# Patient Record
Sex: Male | Born: 1955 | Race: White | Hispanic: No | Marital: Married | Smoking: Former smoker
Health system: Southern US, Community
[De-identification: ages and names within clinical notes are randomized; demographics above are authoritative.]

## PROBLEM LIST (undated history)

## (undated) DIAGNOSIS — T8859XA Other complications of anesthesia, initial encounter: Secondary | ICD-10-CM

## (undated) DIAGNOSIS — Z87442 Personal history of urinary calculi: Secondary | ICD-10-CM

## (undated) DIAGNOSIS — T4145XA Adverse effect of unspecified anesthetic, initial encounter: Secondary | ICD-10-CM

## (undated) DIAGNOSIS — N189 Chronic kidney disease, unspecified: Secondary | ICD-10-CM

## (undated) DIAGNOSIS — E7800 Pure hypercholesterolemia, unspecified: Secondary | ICD-10-CM

## (undated) DIAGNOSIS — M549 Dorsalgia, unspecified: Secondary | ICD-10-CM

## (undated) DIAGNOSIS — F1010 Alcohol abuse, uncomplicated: Secondary | ICD-10-CM

## (undated) DIAGNOSIS — E119 Type 2 diabetes mellitus without complications: Secondary | ICD-10-CM

## (undated) DIAGNOSIS — R51 Headache: Secondary | ICD-10-CM

## (undated) DIAGNOSIS — A549 Gonococcal infection, unspecified: Secondary | ICD-10-CM

## (undated) DIAGNOSIS — I2510 Atherosclerotic heart disease of native coronary artery without angina pectoris: Secondary | ICD-10-CM

## (undated) DIAGNOSIS — I10 Essential (primary) hypertension: Secondary | ICD-10-CM

## (undated) DIAGNOSIS — M1990 Unspecified osteoarthritis, unspecified site: Secondary | ICD-10-CM

## (undated) DIAGNOSIS — G8929 Other chronic pain: Secondary | ICD-10-CM

## (undated) DIAGNOSIS — K219 Gastro-esophageal reflux disease without esophagitis: Secondary | ICD-10-CM

## (undated) DIAGNOSIS — M6530 Trigger finger, unspecified finger: Secondary | ICD-10-CM

## (undated) DIAGNOSIS — IMO0002 Reserved for concepts with insufficient information to code with codable children: Secondary | ICD-10-CM

## (undated) HISTORY — PX: CERVICAL DISC SURGERY: SHX588

## (undated) HISTORY — PX: LUMBAR FUSION: SHX111

## (undated) HISTORY — PX: KNEE ARTHROSCOPY: SUR90

## (undated) HISTORY — PX: KNEE SURGERY: SHX244

---

## 1981-09-15 HISTORY — PX: VASECTOMY: SHX75

## 2006-09-15 HISTORY — PX: LUMBAR FUSION: SHX111

## 2006-12-24 ENCOUNTER — Inpatient Hospital Stay (HOSPITAL_COMMUNITY): Admission: RE | Admit: 2006-12-24 | Discharge: 2006-12-26 | Payer: Self-pay | Admitting: Neurosurgery

## 2007-03-05 ENCOUNTER — Ambulatory Visit: Payer: Self-pay | Admitting: Gastroenterology

## 2008-10-17 ENCOUNTER — Ambulatory Visit: Payer: Self-pay | Admitting: Gastroenterology

## 2009-09-19 ENCOUNTER — Inpatient Hospital Stay: Payer: Self-pay | Admitting: Internal Medicine

## 2010-04-29 ENCOUNTER — Encounter: Admission: RE | Admit: 2010-04-29 | Discharge: 2010-04-29 | Payer: Self-pay | Admitting: Occupational Medicine

## 2010-09-30 ENCOUNTER — Emergency Department: Payer: Self-pay | Admitting: Emergency Medicine

## 2010-11-12 ENCOUNTER — Ambulatory Visit: Payer: Self-pay

## 2011-01-27 ENCOUNTER — Encounter (HOSPITAL_COMMUNITY)
Admission: RE | Admit: 2011-01-27 | Discharge: 2011-01-27 | Disposition: A | Discharge status: Home / Self Care | Payer: Worker's Compensation | Source: Ambulatory Visit | Attending: Orthopedic Surgery | Admitting: Orthopedic Surgery

## 2011-01-27 ENCOUNTER — Other Ambulatory Visit (HOSPITAL_COMMUNITY): Payer: Self-pay | Admitting: Orthopedic Surgery

## 2011-01-27 DIAGNOSIS — M5090 Cervical disc disorder, unspecified, unspecified cervical region: Secondary | ICD-10-CM

## 2011-01-27 LAB — BASIC METABOLIC PANEL
BUN: 10 mg/dL (ref 6–23)
CO2: 24 mEq/L (ref 19–32)
Calcium: 9.3 mg/dL (ref 8.4–10.5)
Chloride: 105 mEq/L (ref 96–112)
Creatinine, Ser: 0.94 mg/dL (ref 0.4–1.5)
GFR calc non Af Amer: >60 mL/min (ref >60)
Glucose, Bld: 190 mg/dL — ABNORMAL HIGH (ref 70–99)
Potassium: 4.2 mEq/L (ref 3.5–5.1)

## 2011-01-27 LAB — CBC
HCT: 38.9 % — ABNORMAL LOW (ref 39.0–52.0)
MCH: 26.4 pg (ref 26.0–34.0)
MCHC: 33.2 g/dL (ref 30.0–36.0)
MCV: 79.6 fL (ref 78.0–100.0)
Platelets: 294 10*3/uL (ref 150–400)
WBC: 6.2 10*3/uL (ref 4.0–10.5)

## 2011-01-27 LAB — SURGICAL PCR SCREEN
MRSA, PCR: NEGATIVE
Staphylococcus aureus: NEGATIVE

## 2011-01-29 ENCOUNTER — Inpatient Hospital Stay (HOSPITAL_COMMUNITY)
Admission: RE | Admit: 2011-01-29 | Discharge: 2011-01-30 | DRG: 473 | Disposition: A | Discharge status: Home / Self Care | Payer: Worker's Compensation | Source: Ambulatory Visit | Attending: Orthopedic Surgery | Admitting: Orthopedic Surgery

## 2011-01-29 ENCOUNTER — Inpatient Hospital Stay (HOSPITAL_COMMUNITY): Payer: Worker's Compensation

## 2011-01-29 DIAGNOSIS — Z7982 Long term (current) use of aspirin: Secondary | ICD-10-CM

## 2011-01-29 DIAGNOSIS — M47812 Spondylosis without myelopathy or radiculopathy, cervical region: Principal | ICD-10-CM | POA: Diagnosis present

## 2011-01-29 DIAGNOSIS — Z79899 Other long term (current) drug therapy: Secondary | ICD-10-CM

## 2011-01-31 NOTE — Op Note (Signed)
Tyler Russell, Tyler Russell                  ACCOUNT NO.:  [id number]   MEDICAL RECORD NO.:  [id number]          PATIENT TYPE:  INP   LOCATION:  2899                         FACILITY:  [facility]   PHYSICIAN:  Reinaldo Meeker, M.D. DATE OF BIRTH:  01/07/1956   DATE OF PROCEDURE:  12/24/2006  DATE OF DISCHARGE:                               OPERATIVE REPORT   PREOPERATIVE DIAGNOSES:  Spinal stenosis, herniated disc L4-L5, L5-S1.   POSTOPERATIVE DIAGNOSES:  Spinal stenosis, herniated disc L4-L5, L5-S1.   PROCEDURE:  Bilateral L4-L5 and L5-S1 complete laminectomy, followed by  L4-L5, L5-S1 bilateral microdiscectomy, followed by L4-L5, L5-S1  posterior lumbar interbody fusion with a bony spacer and PEEK interbody  cage at each level followed by L4-L5 and L5-S1 posterolateral fusion  followed by segmental pedicle screw instrumentation L4-L5, L5-S1.   SECONDARY PROCEDURE:  Microdissection L4, L5 and S1 nerve roots along  with L4-L5 and L5-S1 disk.   SURGEON:  Reinaldo Meeker, M.D.   ASSISTANT:  Donalee Citrin, M.D.   PROCEDURE IN DETAIL:  After placed in the prone position the patient's  back was prepped and draped in the usual sterile fashion.  Localizing x-  rays taken prior to incision to identify the appropriate level.  Midline  incision was made about the spinous processes of the L3, L4, L5, and S1.  Using the Bovie cutting current the incision was carried down to the  spinous processes.  Subperiosteal dissection was then carried out on the  spinous process lamina and facet joint into the far lateral region to  identify the transverse processes of L4 and L5.  This was done  bilaterally and self-retaining retractor was placed for exposure.  Fluoroscopy showed approach at the appropriate levels.  Spinous  processes of L4, L5 and S1 were removed and the bone saved for use later  in the case.  Started on the patient's left side, generous laminotomy  was performed by removing the inferior 3/4 of the  L4 lamina, the  complete L5 lamina and the medial 2/3 of the facet joint.  Similar  decompression was then carried out at L5-S1 on the left side by removing  the medial 2/3 of the facet joint and superior 1/2 of the S1 lamina.  Residual bone was removed and saved for use later in the case.  Ligamentum flavum was removed in a piecemeal fashion.  Similar  decompression was carried out on the opposite side, once again doing a  complete laminectomy of L4-L5 and a superior segmental removal of S1.  Once again, generous medial facetectomy was performed.  Residual midline  structures were then removed to complete the bilateral decompression of  L4, L5 and S1.  At this time, bilateral microdiscectomy was carried out  at L4-L5 and L5-S1 bilaterally.  Thorough disc space clean-out was  carried out.  At same time great care was taken to avoid injury to the  __________ .  This was successfully done.  A large amount of herniated  disc material were identified and removed at both levels.  At this time  disc spaces were  prepared for the posterior lumbar interbody fusion.  Started at L5-S1, distraction up to 8-mm size was carried out.  On the  other side rotating cutter was used followed by chiseling with an 8-mm x  9-mm chisel.  A bony spacer was placed into that space.  On the opposite  side, a similar procedure was carried out and a PEEK interbody spacer  was then used filled with autologous bone and Actifuse.  Prior to  placing the second spacer, autologous bone and BMP were placed in the  midline.  Fluoroscopy showed the spacers to be in good position.  Attention was then turned to L4-L5 and once again distraction was  carried up to an 8-mm size.  Once again, rotating cutter chisel was  carried out bilaterally.  Once again, a bony spacer was placed on one  side and the PEEK spacer on the opposite side.  Once again BMP and  autologous bone was placed in the midline prior to placing the second   spacer.  Once again, fluoroscopy showed the spacers to be in good  position at this level.  At this time pedicle screw fixation was carried  out.  Starting on the patient's right side, screws were placed at L4, L5  and S1.  Drill holes were placed for entry, followed by using a pedicle  awl, tapping and placing appropriate length screws.  Forty (40) mm  screws were placed at L4 and L5, and a 35 mm screw at S1.  A similar  procedure was then carried out on the opposite side.  Once again using  drill for entry point followed by pedicle awl, tapping and placement of  screws of similar length to those on the opposite side.  At this time,  posterolateral fusion was carried out.  High-speed drill was used  decorticate the far lateral region and a mixture of autologous bone,  Actifuse and BMP were placed in the far lateral region to perform the  posterolateral fusion.  This was done bilaterally.  At this time large  amounts of irrigation were carried out.  An appropriate length rod was  then chosen and secured with top loading nuts.  These were then  sequentially tightened with a final tightener and sequential compression  was carried out as they were tightened.  A cross-link was then placed  and final fluoroscopy in AP and lateral direction showed excellent  placement of the screws and rod and interbody spacers.  Gelfoam was then  placed over the dura.  A Hemovac drain was brought out through a  separate stab wound incision and left in the epidural space.  The wound  was then closed in multiple layers with Vicryl on the muscle fascia,  subcutaneous and subcuticular tissues and staples were placed on the  skin.  A sterile dressing was then applied and the patient was extubated  and taken to recovery room in stable condition.           ______________________________  Reinaldo Meeker, M.D.    ROK/MEDQ  D:  12/24/2006  T:  12/24/2006  Job:  [id number]

## 2011-02-24 NOTE — Op Note (Signed)
NAMEKENDRIC, SINDELAR                  ACCOUNT NO.:  [id number]  MEDICAL RECORD NO.:  [id number]           PATIENT TYPE:  I  LOCATION:  5037                         FACILITY:  [facility]  PHYSICIAN:  Alvy Beal, MD    DATE OF BIRTH:  1956/04/11  DATE OF PROCEDURE:  01/29/2011 DATE OF DISCHARGE:                              OPERATIVE REPORT   SURGEON:  Alvy Beal, MD  PREOPERATIVE DIAGNOSIS:  Multilevel cervical spondylotic radiculopathy.  POSTOPERATIVE DIAGNOSIS:  Multilevel cervical spondylotic radiculopathy.  OPERATIVE PROCEDURES: 1. Anterior cervical diskectomy and fusion at C3-4 with Synthes zero     profile size 8 PEEK interbody spacer with 16-mm locking screws. 2. Anterior cervical diskectomy and fusion at C5-6 and C6-7 utilizing     Titan titanium biomechanical intervertebral spacer at C5-6, it was     a seven medium lordotic at C6-7, it was a seven large lordotic     spacer packed with, and then a 34-mm anterior cervical Synthes     Vectra plate with 04-VW screws at the body of C5, 14-mm screws at     the bodies of 6 and 7.  COMPLICATIONS:  None.  CONDITION:  Stable.  Bone substitute used was cortical cancellus bone chips mixed with DBX.  HISTORY:  This is a very pleasant 55 year old gentleman who I have been taking care for sometime now.  He has had persistent debilitating neck and radicular arm pain.  The patient failed conservative management and so we discussed surgical intervention.  After discussing all treatment options, we elected to proceed with aforementioned procedure.  OPERATIVE NOTE:  The patient was brought to the operating room and placed supine on the operating table.  After successful induction of general anesthesia and endotracheal intubation, TEDs, SCDs and Foley were inserted.  The shoulders were taped down.  The neck was properly positioned for an anterior cervical surgery.  Appropriate time-out was done to confirm patient, procedure, and  all pertinent important data. Once this was done, a left-sided longitudinal incision was made along the sternocleidomastoid and sharp dissection was carried out down to the platysma.  The platysma was sharply incised and I began dissecting through the deep cervical and prevertebral fascia until I could palpate the anterior cervical spine.  The omohyoid muscle was identified, tied off and sacrificed.  This allowed for much greater exposure.  At this point, I placed a needle into the 3-4 disk space, took an x-ray and confirmed that I was at the appropriate level.  Once this was done, I mobilized the longus coli muscles from the midbody of C3 to the midbody of C4.  I placed a self-retaining retractors underneath the longus coli muscle and drop the endotracheal cuff, expanded the retractors and reinflated the endotracheal cuff.  I placed distraction pins into the bodies of C3 and C4 and began to do my diskectomy.  A 15- blade scalpel was used to perform an annulotomy.  Using a combination of pituitary rongeurs, curettes, and Kerrison rongeurs, I removed all of the disk material.  I used a fine nerve hook to develop a plane underneath  the posterior longitudinal ligament and then I used the 1 mm Kerrison to remove the posterior longitudinally and the posterior aspect of the annulus.  This allowed me to also trimmed down the bone spurs from the posterior vertebral bodies.  At this point, I had adequate decompression and removal of the offending osteophyte that was causing a central compression.  I rasped the endplates and then measured and elected to use a size 8 lordotic zero profile interbody spacer.  Once this was properly packed with cortical cancellus bone chips, I malleted to the appropriate depth and then secured two locking screws.  This was done without complication.  At this point, I moved to the C5-6 and C6-7 levels.  I repositioned the self-retaining retractors, mobilized the  longus coli muscle and then placed distraction pins into the bodies of C6 and C7.  Using the same technique, I used a C3-4, effected a complete diskectomy at C6-7.  I removed the posterior longitudinal ligament and trimmed down some of the osteophyte.  I then measured with trial devices and elected to put the seven large titanium interbody spacer packed with cortical cancellous bone chips mixed with DBX.  I had a good interpositional graft, it was well fitting.  I then repositioned the distraction pins and then performed a third diskectomy at C5-6 level again in the same technique. Once I had the complete diskectomy and trimmed down the osteophyte, I then placed a seven medium cage.  This fit very well.  I then contoured an anterior cervical plate and secured it using a 16-mm rescue screws at the body of C5 and 14-mm screws in the bodies of 6 and 7.  The plate was properly torqued down and all screws were tight and had good fixation. I then checked to ensure the esophagus did not become inadvertently trapped beneath the plate and it was not.  I irrigated the wound copiously with normal saline, obtained hemostasis using bipolar electrocautery and then closed the platysma with interrupted 2-0 Vicryl sutures and 3-0 Monocryl for the skin.  Steri-Strips, dry dressing were applied.  The patient was extubated and transferred to the PACU without incident.  At the end of the case, all needle and sponge counts were correct.     Alvy Beal, MD     DDB/MEDQ  D:  01/29/2011  T:  01/30/2011  Job:  [id number]  Electronically Signed by Venita Lick MD on 02/24/2011 12:00:16 AM

## 2011-04-17 NOTE — Discharge Summary (Signed)
Tyler Russell, Tyler Russell                  ACCOUNT NO.:  [id number]  MEDICAL RECORD NO.:  [id number]  LOCATION:  5037                         FACILITY:  [facility]  PHYSICIAN:  Alvy Beal, MD    DATE OF BIRTH:  1956-07-21  DATE OF ADMISSION:  01/29/2011 DATE OF DISCHARGE:  01/30/2011                              DISCHARGE SUMMARY   ADMITTING DIAGNOSIS:  Cervical spondylitic radiculopathy.  DISCHARGE DIAGNOSIS:  Cervical spondylitic radiculopathy, status post anterior cervical diskectomy and fusion C3-4, C5-7.  SURGEON:  Alvy Beal, MD  OPERATIVE PROCEDURE: 1. Anterior cervical diskectomy and fusion at C3-4 with Synthes zero     profile size 8 PEEK interbody spacer with 16-mm locking screws. 2. Anterior cervical diskectomy and fusion at C5-6 and C6-7 utilizing     titanium, biomechanical intervertebral spacer at C5-6 and C6-7,     anterior cervical Synthes extra plate with 04-VW screws at the body     of C5 and 14-mm at the body of C6 and C7.  ANESTHESIA:  General.  COMPLICATIONS:  None.  CONSULTS:  None.  BRIEF HISTORY:  The patient is a 55 year old male who was injured on the job.  He was seen in our office for ongoing progressively worsening neck and arm pain.  Since the injury, he tried and failed injection therapy, physical therapy, activity modifications, and medications.  His symptoms continued to worsen and he elected to proceed with surgery.  MRI of the cervical spine dated August 13, 2010, demonstrates; 1. Multilevel degenerative changes of the cervical spine, most     prominent at C3-4, C5-6, and V6-7. 2. C3-4 mild-to-moderate central canal stenosis with mild cord     flattening.  Severe bilateral neural foraminal narrowing with     encroachment on the C4 dorsal root ganglia. 3. C5-6 mild central stenosis with minimal core deformity.  Severe     right neural foraminal narrowing with encroachment on the right C6     dorsal root ganglion. 4. C6-7 mild central  stenosis with minimal core deformity.  A left     posterolateral cystic extrusion encroaches on the left C7 root.     Moderate-to-severe bilateral neural foraminal narrowing with     probable encroachment on the C7 dorsal root ganglion.  HOSPITAL COURSE:  On Jan 29, 2011, the patient was admitted to [facility] and underwent the above procedures without complication. The patient's condition following these procedures was stable.  He was transferred to the PACU and then to the Orthopedic Floor.  On postop day 1, the patient was experiencing some issues with oral pain control.  His vital signs were stable.  He was afebrile.  Neurovascular status was intact bilaterally.  Wound was clean, dry, and intact.  There was no shortness of breath or chest pain.  He ambulating without difficulty with the assistance of physical therapy.  X-rays were satisfactory.  It was felt necessary to hold discharge until Friday, Jan 31, 2011, to achieve more adequate pain control.  Postop day 2, the patient was doing better.  There was adequate pain control with oral medications.  He was ambulating without difficulty and cleared by Physical Therapy.  He was tolerating his diet.  Tolerating oral medications.  Voiding.  PLAN:  The patient will be discharged to home.  He declined OT services saying that his wife will help him as needed.  He will follow up in our office in 2 weeks for wound check and evaluation.  Discharge instructions were given to the patient at the time of discharge.  He understands.  DISCHARGE MEDICATIONS: 1. Equate brand OTC allergy medicines 1 p.o. daily p.r.n. 2. Prilosec 20 mg 1 p.o. b.i.d. 3. Metoprolol tartrate 25 mg 0.5 one p.o. b.i.d.  At the time of discharge, the patient was instructed to stop the aspirin, Flexeril, and the oxycodone.  All of the patient's questions were encouraged, address and answered. At the time of discharge, the patient was stable and in good  condition.    ______________________________ Norval Gable, PA   ______________________________ Alvy Beal, MD    DK/MEDQ  D:  04/10/2011  T:  04/11/2011  Job:  [id number]  Electronically Signed by Norval Gable PA on 04/14/2011 08:27:08 AM Electronically Signed by Venita Lick MD on 04/17/2011 11:27:49 AM

## 2011-06-11 ENCOUNTER — Other Ambulatory Visit: Payer: Self-pay | Admitting: Orthopedic Surgery

## 2011-06-11 DIAGNOSIS — M549 Dorsalgia, unspecified: Secondary | ICD-10-CM

## 2011-06-20 ENCOUNTER — Other Ambulatory Visit: Payer: Self-pay

## 2011-06-20 ENCOUNTER — Ambulatory Visit
Admission: RE | Admit: 2011-06-20 | Discharge: 2011-06-20 | Disposition: A | Discharge status: Home / Self Care | Payer: Worker's Compensation | Source: Ambulatory Visit | Attending: Orthopedic Surgery | Admitting: Orthopedic Surgery

## 2011-06-20 DIAGNOSIS — M549 Dorsalgia, unspecified: Secondary | ICD-10-CM

## 2011-08-15 ENCOUNTER — Encounter (HOSPITAL_COMMUNITY): Payer: Self-pay | Admitting: Respiratory Therapy

## 2011-08-18 NOTE — H&P (Addendum)
Tyler Russell 08/18/2011 9:53 AM Location: [facility] Patient #: [id number] WC DOB: 02/28/1956 Married / Language: English / Race: Refused to Report/Unreported Male   History of Present Illness(DIANA Dierdre Highman, PA-C; 08/18/2011 10:37 AM) The patient is a 55 year old male who comes in today for a preoperative History and Physical. The patient is scheduled for a XLIF L3-4 with possible removal of existing hardware vs extension of previous pedicle screw fixation (for back pain) to be performed by Dr. Debria Garret D. Shon Baton, MD at [facility] on Thursday, August 28, 2011 at 10:30AM .    Theodora Blow, MD; 08/18/2011 9:55 AM) Tyler Russell [facility])   Family History(DIANA J [facility], PA-C; 08/18/2011 10:51 AM) Cancer. grandmother fathers side Cerebrovascular Accident. father Diabetes Mellitus. mother Drug / Alcohol Addiction. father, brother, grandfather mothers side and grandfather fathers side Heart Disease. mother and brother Heart disease in male family member before age 47 Hypertension. mother   Social History(DIANA J [facility], PA-C; 08/18/2011 10:51 AM) Tobacco use. Former smoker. former smoker; smoke(d) 1 pack(s) per day No alcohol use Alcohol use. current drinker; drinks beer; only occasionally per week Children. 3 Current work status. disabled Drug/Alcohol Rehab (Currently). no Drug/Alcohol Rehab (Previously). no Exercise. Exercises daily; does running / walking Illicit drug use. no Living situation. live with spouse Marital status. married Number of flights of stairs before winded. 2-3 Pain Contract. no Tobacco / smoke exposure. yes   Medication History(Izaac Reisig D Misty Foutz, MD; 08/18/2011 9:56 AM) Flexeril (10MG  Tablet, 1 Oral PO TID, Taken starting 08/12/2011) Active. (DDB/DJK/JAF RX LEFT AT FRONT DESK-PT KNOWS) OxyCODONE HCl (5MG  Tablet, 1 Oral PO TID, Taken starting 08/12/2011) Active. (DDB/DJK/JAF RX LEFT AT FRONT DESK-PT  KNOWS) PriLOSEC OTC (20MG  Tablet DR, Oral) Active. Metoprolol Tartrate (25MG  Tablet, Oral) Active.   Past Surgical History(DIANA J [facility], PA-C; 08/18/2011 10:51 AM) Neck Disc Surgery Other Orthopaedic Surgery Spinal Fusion. neck and lower back Spinal Surgery   Other Problems(DIANA J [facility], PA-C; 08/18/2011 10:51 AM) Chronic Pain Coronary artery disease Gastroesophageal Reflux Disease High blood pressure Hypercholesterolemia Kidney Stone Ulcer disease   Review of Systems(DIANA J [facility], PA-C; 08/18/2011 10:51 AM) General:Not Present- Chills, Fever, Night Sweats, Appetite Loss, Fatigue, Feeling sick, Weight Gain and Weight Loss. Skin:Present- Itching. Not Present- Rash, Skin Color Changes, Ulcer, Psoriasis and Change in Hair or Nails. HEENT:Present- Hearing problems and Ringing in the Ears. Not Present- Sensitivity to light and Nose Bleed. Neck:Not Present- Swollen Glands and Neck Mass. Respiratory:Not Present- Snoring, Chronic Cough, Bloody sputum and Dyspnea. Cardiovascular:Present- Swelling of Extremities. Not Present- Shortness of Breath, Chest Pain, Leg Cramps and Palpitations. Gastrointestinal:Present- Heartburn. Not Present- Bloody Stool, Abdominal Pain, Vomiting, Nausea and Incontinence of Stool. Male Genitourinary:Not Present- Blood in Urine, Frequency, Incontinence and Nocturia. Musculoskeletal:Present- Joint Swelling. Not Present- Muscle Weakness, Muscle Pain, Joint Stiffness, Joint Pain and Back Pain. Neurological:Present- Numbness and Headaches. Not Present- Tingling, Burning, Tremor and Dizziness. Psychiatric:Present- Anxiety. Not Present- Depression and Memory Loss. Endocrine:Not Present- Cold Intolerance, Heat Intolerance, Excessive hunger and Excessive Thirst. Hematology:Not Present- Abnormal Bleeding, Anemia, Blood Clots and Easy Bruising.   Vitals(Ames Hoban D Shon Baton, MD; 08/18/2011 9:55 AM) 08/18/2011 9:53 AM Weight: 186 lb Height: 67  in Body Surface Area: 2 m Body Mass Index: 29.13 kg/m Pain Level: 4/10 BP: 152/82 (Sitting, Left Arm, Standard)    Physical Exam(DIANA J KOVACH, PA-C; 08/18/2011 10:49 AM) The physical exam findings are as follows:   General General Appearance- pleasant. Not in acute distress. Orientation- Oriented X3. Build & Nutrition- Well nourished and  Well developed. Posture- Normal posture. Gait- Normal. Mental Status- Alert.   Integumentary Lumbar Spine- Skin examination of the lumbar spine is without deformity, skin lesions, lacerations or abrasions.   Head and Neck Neck Global Assessment- supple. no lymphadenopathy and no nucchal rigidty.   Eye Pupil- Bilateral- Normal, Direct reaction to light normal, Equal and Regular. Motion- Bilateral- EOMI.   Chest and Lung Exam Auscultation: Breath sounds:- Clear.   Cardiovascular Auscultation:Rhythm- Regular rate and rhythm. Heart Sounds- Normal heart sounds.   Abdomen Palpation/Percussion:Palpation and Percussion of the abdomen reveal - Non Tender, No Rebound tenderness and Soft.   Peripheral Vascular Lower Extremity:Inspection- Bilateral- Inspection Normal. Palpation:Posterior tibial pulse- Bilateral- 2+. Dorsalis pedis pulse- Bilateral- 2+.   Neurologic Sensation:Lower Extremity- Bilateral- sensation is intact in the lower extremity. Reflexes:Patellar Reflex- Bilateral- 1+. Achilles Reflex- Bilateral- 1+. Babinski- Bilateral- Babinski not present. Clonus- Bilateral- clonus not present. Hoffman's Sign- Bilateral- Hoffman's sign not present. Testing:Seated Straight Leg Raise- Bilateral- Seated straight leg raise negative.   Musculoskeletal Spine/Ribs/Pelvis Lumbosacral Spine:Inspection and Palpation- Tenderness- generalized. bony and soft tissue palpation of the lumbar spine and SI joint does not recreate their typical pain. Strength and Tone: Strength:Hip  Flexion- Bilateral- 5/5. Knee Extension- Bilateral- 5/5. Knee Flexion- Bilateral- 5/5. Ankle Dorsiflexion- Bilateral- 5/5. Ankle Plantarflexion- Bilateral- 5/5. Heel walk- Bilateral- able to heel walk without difficulty. Toe Walk- Bilateral- able to walk on toes without difficulty. Heel-Toe Walk- Bilateral- able to heel-toe walk without difficulty. ROM- Flexion- mildly decreased range of motion and painful. Extension- mildly decreased range of motion and painful. Pain:- neither flexion or extension is more painful than the other. Waddell's Signs- no Waddell's signs present. Lower Extremity Range of Motion:- No true hip, knee or ankle pain with range of motion. Gait and Station:Assistive Devices- no assistive devices.   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   CT scan dated 06/20/11 demonstrates abnormal lumbar discography at L2-3 and L3-4. Provocative discography from 06/20/11 demonstrates L3-4 severe degeneration, posterior annular tear, low disc pressure. Pain reproduced. Concordant pain noted.   He has not yet completed his preop requirements at the hospital. He has been medically cleared by Dr. Yates Decamp. It should be known that per Dr. Dan Humphreys the patient has known nonocclusive CAD and should be continued on a beta blocker pre and postop. Please see the specifics of the medical clearance scanned into the chart for further specifics. He will be fitted for a corset brace by therapy and he knows to bring the brace with  him on the day of surgery.   Risk/benefits/alternative treatments and expectations following surgery have been reviewed with the patient by Dr. Shon Baton. All of his questions were encouraged, addressed and answered. Plan at this time is to proceed with surgery as scheduled.   Signed electronically by Gwinda Maine, PA-C (08/18/2011 11:01 AM) Tora Kindred 07/01/2011 9:31 AM Location: [facility] Patient #: [id number] WC DOB: 05-09-1956 Married / Language: Undefined / Race: Undefined Male  The patient is a 55 year old Male.   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) Current Plans l Follow up for H+P [TBS]  l Risks of surgery include, but are not limited to: Death, stroke, paralysis, nerve root damage/injury, bleeding, blood clots, loss of bowel/bladder control, sexual dysfunction, retrograde ejaculation, hardware failure, or malposition, spinal fluid leak, adjacent segment disease, non-union, need for further surgery, ongoing or worse pain, injury to bladder, bowel and abdominal contents, infection and recurrent disc herniation, injury to the lumbar plexus and difficulty walking  l We have gone over the risks and benefits of surgery, which include infection, bleeding, nerve damage, death, stroke, paralysis, failure to heal, need for further surgery, ongoing or worse pain, loss of fixation, need for further surgery, CSF leak, loss of bowel or bladder control, ongoing or worse pain.   Note: Reviewed discogram with patient and wife. All questions addressed Patient with 2 level DDD with only the L3/4 causing concordonent pain. Discussed surgical procedure (1 or 2 level) fusion Reviewed risks/benefits of surgery and of the 1 or 2 level procedure Plan: L3/4 lateral fusion with supplemental posterior instrumentation. Possible removal of previous instrumentation vs extension of existing pedicle screw  fixation. Out of work Consider permenantly totally disabled Hopefully with successful surgey he maybe able to return to some form of gainful employment in the future information provided about surgery await WC approval   Signed electronically by Alvy Beal, MD (07/04/2011 12:02 PM)    History reviewed No change in clinical exam Adjacent segment DDD Plan on L3-4  Lateral fusion with posterior instrumentation

## 2011-08-21 ENCOUNTER — Encounter (HOSPITAL_COMMUNITY): Payer: Self-pay

## 2011-08-21 ENCOUNTER — Encounter (HOSPITAL_COMMUNITY)
Admission: RE | Admit: 2011-08-21 | Discharge: 2011-08-21 | Disposition: A | Discharge status: Home / Self Care | Payer: Worker's Compensation | Source: Ambulatory Visit | Attending: Orthopedic Surgery | Admitting: Orthopedic Surgery

## 2011-08-21 HISTORY — DX: Chronic kidney disease, unspecified: N18.9

## 2011-08-21 HISTORY — DX: Pure hypercholesterolemia, unspecified: E78.00

## 2011-08-21 HISTORY — DX: Adverse effect of unspecified anesthetic, initial encounter: T41.45XA

## 2011-08-21 HISTORY — DX: Reserved for concepts with insufficient information to code with codable children: IMO0002

## 2011-08-21 HISTORY — DX: Atherosclerotic heart disease of native coronary artery without angina pectoris: I25.10

## 2011-08-21 HISTORY — DX: Headache: R51

## 2011-08-21 HISTORY — DX: Trigger finger, unspecified finger: M65.30

## 2011-08-21 HISTORY — DX: Unspecified osteoarthritis, unspecified site: M19.90

## 2011-08-21 HISTORY — DX: Other complications of anesthesia, initial encounter: T88.59XA

## 2011-08-21 LAB — CBC
HCT: 38.5 % — ABNORMAL LOW (ref 39.0–52.0)
Platelets: 293 10*3/uL (ref 150–400)
RDW: 13.7 % (ref 11.5–15.5)
WBC: 7.4 10*3/uL (ref 4.0–10.5)

## 2011-08-21 LAB — MRSA PCR SCREENING: MRSA by PCR: NEGATIVE

## 2011-08-21 LAB — COMPREHENSIVE METABOLIC PANEL
ALT: 86 U/L — ABNORMAL HIGH (ref 0–53)
AST: 50 U/L — ABNORMAL HIGH (ref 0–37)
Albumin: 3.7 g/dL (ref 3.5–5.2)
Alkaline Phosphatase: 123 U/L — ABNORMAL HIGH (ref 39–117)
BUN: 8 mg/dL (ref 6–23)
Chloride: 101 mEq/L (ref 96–112)
Potassium: 4.5 mEq/L (ref 3.5–5.1)
Sodium: 139 mEq/L (ref 135–145)
Total Bilirubin: 0.3 mg/dL (ref 0.3–1.2)

## 2011-08-21 LAB — DIFFERENTIAL
Basophils Relative: 1 % (ref 0–1)
Eosinophils Absolute: 0.1 10*3/uL (ref 0.0–0.7)
Eosinophils Relative: 1 % (ref 0–5)
Monocytes Absolute: 0.8 10*3/uL (ref 0.1–1.0)
Monocytes Relative: 11 % (ref 3–12)

## 2011-08-21 LAB — TYPE AND SCREEN

## 2011-08-21 NOTE — Pre-Procedure Instructions (Signed)
20 Lukus Binion  08/21/2011   Your procedure is scheduled on:  Thursday August 28, 2011  Report to [facility] at 0830 AM.  Call this number if you have problems the morning of surgery: [phone]   Remember:   Do not eat food:After Midnight.  May have clear liquids: up to 4 Hours before arrival.(up to 4:30am)  Clear liquids include soda, tea, black coffee, apple or grape juice, broth.  Take these medicines the morning of surgery with A SIP OF WATER: flexeril, metoprolol, omeprazole, oxycodone   Do not wear jewelry, make-up or nail polish.  Do not wear lotions, powders, or perfumes. You may wear deodorant.  Do not shave 48 hours prior to surgery.  Do not bring valuables to the hospital.  Contacts, dentures or bridgework may not be worn into surgery.  Leave suitcase in the car. After surgery it may be brought to your room.  For patients admitted to the hospital, checkout time is 11:00 AM the day of discharge.   Patients discharged the day of surgery will not be allowed to drive home.  Name and phone number of your driver: family member  Special Instructions: CHG Shower Use Special Wash: 1/2 bottle night before surgery and 1/2 bottle morning of surgery.   Please read over the following fact sheets that you were given: Pain Booklet, Coughing and Deep Breathing, Blood Transfusion Information, MRSA Information and Surgical Site Infection Prevention

## 2011-08-21 NOTE — Progress Notes (Signed)
Lumbar 2-3 done May 2012.

## 2011-08-21 NOTE — Progress Notes (Signed)
Spoke with Lafonda Mosses with Dr. Shon Baton office, ok to use EKG and CXR from 01/27/2011.

## 2011-08-21 NOTE — Progress Notes (Signed)
Patient states  difficult to wake from anesthesia and some difficulty with breathing discussed with Revonda Standard.  Revonda Standard will make anesthesia aware.

## 2011-08-21 NOTE — Anesthesia Preprocedure Evaluation (Addendum)
Anesthesia Evaluation  Patient identified by MRN, date of birth, ID band Patient awake    Reviewed: Allergy & Precautions, H&P , NPO status , Patient's Chart, lab work & pertinent test results, reviewed documented beta blocker date and time   Airway Mallampati: II TM Distance: >3 FB Neck ROM: full    Dental No notable dental hx. (+) Edentulous Upper and Edentulous Lower   Pulmonary neg pulmonary ROS,  clear to auscultation  Pulmonary exam normal       Cardiovascular hypertension, On Medications and On Home Beta Blockers + CAD regular Normal    Neuro/Psych  Headaches,  Neuromuscular disease Negative Neurological ROS  Negative Psych ROS   GI/Hepatic negative GI ROS, Neg liver ROS,   Endo/Other  Negative Endocrine ROS  Renal/GU negative Renal ROS  Genitourinary negative   Musculoskeletal   Abdominal   Peds  Hematology negative hematology ROS (+)   Anesthesia Other Findings   Reproductive/Obstetrics negative OB ROS                          Anesthesia Physical Anesthesia Plan  ASA: III  Anesthesia Plan: General   Post-op Pain Management:    Induction: Intravenous  Airway Management Planned: Oral ETT and Video Laryngoscope Planned  Additional Equipment:   Intra-op Plan:   Post-operative Plan: Extubation in OR  Informed Consent: I have reviewed the patients History and Physical, chart, labs and discussed the procedure including the risks, benefits and alternatives for the proposed anesthesia with the patient or authorized representative who has indicated his/her understanding and acceptance.     Plan Discussed with: CRNA  Anesthesia Plan Comments: (08/21/11 Patient reported he was slow to wake up from anesthesia after his 3 level ACDF on 01/29/11.  I placed a copy of his last Anesthesia record on his chart for review.  A glidescope was utilized.  Shonna Chock, PA-C)        Anesthesia Quick Evaluation

## 2011-08-27 MED ORDER — LACTATED RINGERS IV SOLN
INTRAVENOUS | Status: DC
  Administered 2011-08-28: 10:00:00 via INTRAVENOUS

## 2011-08-27 MED ORDER — CEFAZOLIN SODIUM-DEXTROSE 2-3 GM-% IV SOLR: 2.0000 g | INTRAVENOUS | Status: DC

## 2011-08-27 MED ORDER — CEFAZOLIN SODIUM-DEXTROSE 2-3 GM-% IV SOLR
2.0000 g | INTRAVENOUS | Status: AC
  Administered 2011-08-28: 2 g via INTRAVENOUS
  Filled 2011-08-27: qty 50

## 2011-08-28 ENCOUNTER — Encounter (HOSPITAL_COMMUNITY): Payer: Self-pay | Admitting: *Deleted

## 2011-08-28 ENCOUNTER — Inpatient Hospital Stay (HOSPITAL_COMMUNITY): Payer: Worker's Compensation

## 2011-08-28 ENCOUNTER — Inpatient Hospital Stay (HOSPITAL_COMMUNITY)
Admission: RE | Admit: 2011-08-28 | Discharge: 2011-08-30 | DRG: 455 | Disposition: A | Discharge status: Home Health | Payer: Worker's Compensation | Source: Ambulatory Visit | Attending: Orthopedic Surgery | Admitting: Orthopedic Surgery

## 2011-08-28 ENCOUNTER — Encounter (HOSPITAL_COMMUNITY): Payer: Self-pay | Admitting: Vascular Surgery

## 2011-08-28 ENCOUNTER — Encounter (HOSPITAL_COMMUNITY)
Admission: RE | Disposition: A | Discharge status: Home Health | Payer: Self-pay | Source: Ambulatory Visit | Attending: Orthopedic Surgery

## 2011-08-28 ENCOUNTER — Inpatient Hospital Stay (HOSPITAL_COMMUNITY): Payer: Worker's Compensation | Admitting: Vascular Surgery

## 2011-08-28 DIAGNOSIS — G8929 Other chronic pain: Secondary | ICD-10-CM | POA: Diagnosis present

## 2011-08-28 DIAGNOSIS — I10 Essential (primary) hypertension: Secondary | ICD-10-CM | POA: Diagnosis present

## 2011-08-28 DIAGNOSIS — Z8711 Personal history of peptic ulcer disease: Secondary | ICD-10-CM

## 2011-08-28 DIAGNOSIS — M961 Postlaminectomy syndrome, not elsewhere classified: Secondary | ICD-10-CM | POA: Diagnosis present

## 2011-08-28 DIAGNOSIS — M51379 Other intervertebral disc degeneration, lumbosacral region without mention of lumbar back pain or lower extremity pain: Principal | ICD-10-CM | POA: Diagnosis present

## 2011-08-28 DIAGNOSIS — M5136 Other intervertebral disc degeneration, lumbar region: Secondary | ICD-10-CM

## 2011-08-28 DIAGNOSIS — K219 Gastro-esophageal reflux disease without esophagitis: Secondary | ICD-10-CM | POA: Diagnosis present

## 2011-08-28 DIAGNOSIS — I2510 Atherosclerotic heart disease of native coronary artery without angina pectoris: Secondary | ICD-10-CM | POA: Diagnosis present

## 2011-08-28 DIAGNOSIS — M5137 Other intervertebral disc degeneration, lumbosacral region: Principal | ICD-10-CM | POA: Diagnosis present

## 2011-08-28 DIAGNOSIS — E7800 Pure hypercholesterolemia, unspecified: Secondary | ICD-10-CM | POA: Diagnosis present

## 2011-08-28 HISTORY — PX: ANTERIOR LAT LUMBAR FUSION: SHX1168

## 2011-08-28 SURGERY — ANTERIOR LATERAL LUMBAR FUSION 1 LEVEL
Anesthesia: General | Site: Spine Lumbar | Wound class: Clean

## 2011-08-28 MED ORDER — ROCURONIUM BROMIDE 100 MG/10ML IV SOLN
INTRAVENOUS | Status: DC | PRN
  Administered 2011-08-28: 40 mg via INTRAVENOUS

## 2011-08-28 MED ORDER — DEXAMETHASONE 4 MG PO TABS
4.0000 mg | ORAL_TABLET | Freq: Four times a day (QID) | ORAL | Status: DC
  Administered 2011-08-29 – 2011-08-30 (×4): 4 mg via ORAL
  Filled 2011-08-28 (×10): qty 1

## 2011-08-28 MED ORDER — HETASTARCH-ELECTROLYTES 6 % IV SOLN
INTRAVENOUS | Status: DC | PRN
  Administered 2011-08-28: 11:00:00 via INTRAVENOUS

## 2011-08-28 MED ORDER — PROMETHAZINE HCL 25 MG/ML IJ SOLN: 6.2500 mg | INTRAMUSCULAR | Status: DC | PRN

## 2011-08-28 MED ORDER — DIPHENHYDRAMINE HCL 12.5 MG/5ML PO ELIX
25.0000 mg | ORAL_SOLUTION | Freq: Four times a day (QID) | ORAL | Status: DC | PRN
Start: 2011-08-28 — End: 2011-08-30
  Administered 2011-08-28: 25 mg via ORAL
  Filled 2011-08-28: qty 10

## 2011-08-28 MED ORDER — ACETAMINOPHEN 10 MG/ML IV SOLN
1000.0000 mg | Freq: Four times a day (QID) | INTRAVENOUS | Status: AC
  Administered 2011-08-29 (×4): 1000 mg via INTRAVENOUS
  Filled 2011-08-28 (×4): qty 100

## 2011-08-28 MED ORDER — MORPHINE SULFATE 4 MG/ML IJ SOLN
1.0000 mg | INTRAMUSCULAR | Status: DC | PRN
  Administered 2011-08-28 (×2): 2 mg via INTRAVENOUS
  Administered 2011-08-29: 4 mg via INTRAVENOUS
  Administered 2011-08-29 (×2): 2 mg via INTRAVENOUS
  Filled 2011-08-28 (×4): qty 1

## 2011-08-28 MED ORDER — MIDAZOLAM HCL 5 MG/5ML IJ SOLN
INTRAMUSCULAR | Status: DC | PRN
  Administered 2011-08-28: 2 mg via INTRAVENOUS

## 2011-08-28 MED ORDER — DEXAMETHASONE SODIUM PHOSPHATE 4 MG/ML IJ SOLN
8.0000 mg | Freq: Once | INTRAMUSCULAR | Status: DC
  Filled 2011-08-28: qty 2

## 2011-08-28 MED ORDER — DEXAMETHASONE SODIUM PHOSPHATE 4 MG/ML IJ SOLN
4.0000 mg | Freq: Four times a day (QID) | INTRAMUSCULAR | Status: DC
  Administered 2011-08-29 (×2): 4 mg via INTRAVENOUS
  Filled 2011-08-28 (×10): qty 1

## 2011-08-28 MED ORDER — OXYCODONE HCL 5 MG PO TABS
10.0000 mg | ORAL_TABLET | ORAL | Status: DC | PRN
  Administered 2011-08-28 – 2011-08-30 (×10): 10 mg via ORAL
  Filled 2011-08-28 (×10): qty 2

## 2011-08-28 MED ORDER — LACTATED RINGERS IV SOLN
INTRAVENOUS | Status: DC
  Administered 2011-08-28: 18:00:00 via INTRAVENOUS

## 2011-08-28 MED ORDER — THROMBIN 20000 UNITS EX KIT
PACK | CUTANEOUS | Status: DC | PRN
  Administered 2011-08-28: 11:00:00 via TOPICAL

## 2011-08-28 MED ORDER — ACETAMINOPHEN 10 MG/ML IV SOLN
1000.0000 mg | Freq: Once | INTRAVENOUS | Status: AC
  Administered 2011-08-28: 1000 mg via INTRAVENOUS
  Filled 2011-08-28: qty 100

## 2011-08-28 MED ORDER — SODIUM CHLORIDE 0.9 % IJ SOLN
3.0000 mL | Freq: Two times a day (BID) | INTRAMUSCULAR | Status: DC
  Administered 2011-08-29 – 2011-08-30 (×3): 3 mL via INTRAVENOUS

## 2011-08-28 MED ORDER — LACTATED RINGERS IV SOLN
INTRAVENOUS | Status: DC | PRN
  Administered 2011-08-28 (×4): via INTRAVENOUS

## 2011-08-28 MED ORDER — ONDANSETRON HCL 4 MG/2ML IJ SOLN
INTRAMUSCULAR | Status: DC | PRN
  Administered 2011-08-28: 4 mg via INTRAVENOUS

## 2011-08-28 MED ORDER — BUPIVACAINE-EPINEPHRINE 0.25% -1:200000 IJ SOLN
INTRAMUSCULAR | Status: DC | PRN
  Administered 2011-08-28: 10 mL

## 2011-08-28 MED ORDER — PROPOFOL 10 MG/ML IV EMUL
INTRAVENOUS | Status: DC | PRN
  Administered 2011-08-28: 140 mg via INTRAVENOUS

## 2011-08-28 MED ORDER — DEXAMETHASONE SODIUM PHOSPHATE 4 MG/ML IJ SOLN
INTRAMUSCULAR | Status: DC | PRN
  Administered 2011-08-28: 8 mg via INTRAVENOUS

## 2011-08-28 MED ORDER — VANCOMYCIN HCL 1000 MG IV SOLR
1000.0000 mg | INTRAVENOUS | Status: AC
  Administered 2011-08-28: 1000 mg
  Filled 2011-08-28: qty 1000

## 2011-08-28 MED ORDER — MORPHINE SULFATE 2 MG/ML IJ SOLN
INTRAMUSCULAR | Status: AC
  Administered 2011-08-28: 2 mg via INTRAVENOUS
  Filled 2011-08-28: qty 1

## 2011-08-28 MED ORDER — SODIUM CHLORIDE 0.9 % IR SOLN
Status: DC | PRN
  Administered 2011-08-28 (×2): 1000 mL

## 2011-08-28 MED ORDER — HYDROMORPHONE HCL PF 1 MG/ML IJ SOLN
INTRAMUSCULAR | Status: AC
  Filled 2011-08-28: qty 1

## 2011-08-28 MED ORDER — HYDROMORPHONE HCL PF 1 MG/ML IJ SOLN
0.2500 mg | INTRAMUSCULAR | Status: DC | PRN
  Administered 2011-08-28 (×4): 0.5 mg via INTRAVENOUS

## 2011-08-28 MED ORDER — SODIUM CHLORIDE 0.9 % IV SOLN: 250.0000 mL | INTRAVENOUS | Status: DC

## 2011-08-28 MED ORDER — METOPROLOL TARTRATE 25 MG PO TABS
25.0000 mg | ORAL_TABLET | Freq: Two times a day (BID) | ORAL | Status: DC
  Administered 2011-08-28 – 2011-08-30 (×4): 25 mg via ORAL
  Filled 2011-08-28 (×5): qty 1

## 2011-08-28 MED ORDER — CEFAZOLIN SODIUM 1-5 GM-% IV SOLN
1.0000 g | Freq: Three times a day (TID) | INTRAVENOUS | Status: AC
  Administered 2011-08-28 – 2011-08-29 (×2): 1 g via INTRAVENOUS
  Filled 2011-08-28 (×2): qty 50

## 2011-08-28 MED ORDER — ZOLPIDEM TARTRATE 10 MG PO TABS: 10.0000 mg | ORAL_TABLET | Freq: Every evening | ORAL | Status: DC | PRN

## 2011-08-28 MED ORDER — PHENOL 1.4 % MT LIQD
1.0000 | OROMUCOSAL | Status: DC | PRN
  Filled 2011-08-28: qty 177

## 2011-08-28 MED ORDER — SODIUM CHLORIDE 0.9 % IJ SOLN: 3.0000 mL | INTRAMUSCULAR | Status: DC | PRN

## 2011-08-28 MED ORDER — DOCUSATE SODIUM 100 MG PO CAPS
100.0000 mg | ORAL_CAPSULE | Freq: Two times a day (BID) | ORAL | Status: DC
  Administered 2011-08-28 – 2011-08-30 (×4): 100 mg via ORAL
  Filled 2011-08-28 (×4): qty 1

## 2011-08-28 MED ORDER — MEPERIDINE HCL 25 MG/ML IJ SOLN: 6.2500 mg | INTRAMUSCULAR | Status: DC | PRN

## 2011-08-28 MED ORDER — FENTANYL CITRATE 0.05 MG/ML IJ SOLN
INTRAMUSCULAR | Status: DC | PRN
  Administered 2011-08-28: 150 ug via INTRAVENOUS
  Administered 2011-08-28 (×5): 50 ug via INTRAVENOUS
  Administered 2011-08-28: 100 ug via INTRAVENOUS
  Administered 2011-08-28 (×4): 50 ug via INTRAVENOUS

## 2011-08-28 MED ORDER — CYCLOBENZAPRINE HCL 10 MG PO TABS
10.0000 mg | ORAL_TABLET | Freq: Three times a day (TID) | ORAL | Status: DC | PRN
  Administered 2011-08-28 – 2011-08-30 (×5): 10 mg via ORAL
  Filled 2011-08-28 (×5): qty 1

## 2011-08-28 MED ORDER — PANTOPRAZOLE SODIUM 40 MG PO TBEC
40.0000 mg | DELAYED_RELEASE_TABLET | Freq: Every day | ORAL | Status: DC
  Administered 2011-08-29 – 2011-08-30 (×3): 40 mg via ORAL
  Filled 2011-08-28 (×3): qty 1

## 2011-08-28 MED ORDER — ONDANSETRON HCL 4 MG/2ML IJ SOLN: 4.0000 mg | INTRAMUSCULAR | Status: DC | PRN

## 2011-08-28 MED ORDER — ACETAMINOPHEN 10 MG/ML IV SOLN
INTRAVENOUS | Status: AC
  Filled 2011-08-28: qty 100

## 2011-08-28 MED ORDER — MENTHOL 3 MG MT LOZG: 1.0000 | LOZENGE | OROMUCOSAL | Status: DC | PRN

## 2011-08-28 MED ORDER — PROPOFOL 10 MG/ML IV EMUL
INTRAVENOUS | Status: DC | PRN
  Administered 2011-08-28: 50 ug/kg/min via INTRAVENOUS

## 2011-08-28 SURGICAL SUPPLY — 78 items
ADH SKN CLS APL DERMABOND .7 (GAUZE/BANDAGES/DRESSINGS) ×2
APPLIER CLIP 11 MED OPEN (CLIP)
APR CLP MED 11 20 MLT OPN (CLIP)
BLADE SURG 10 STRL SS (BLADE) ×3 IMPLANT
BLADE SURG ROTATE 9660 (MISCELLANEOUS) IMPLANT
BUR EGG ELITE 4.0 (BURR) ×1 IMPLANT
CAGE COUGAR LATERAL 18X10X55 (Cage) ×1 IMPLANT
CLIP APPLIE 11 MED OPEN (CLIP) IMPLANT
CLOTH BEACON ORANGE TIMEOUT ST (SAFETY) ×3 IMPLANT
CONNECTOR EXPEDIUM TI 55MM (Connector) ×2 IMPLANT
CORDS BIPOLAR (ELECTRODE) ×3 IMPLANT
COVER SURGICAL LIGHT HANDLE (MISCELLANEOUS) ×3 IMPLANT
DERMABOND ADVANCED (GAUZE/BANDAGES/DRESSINGS) ×1
DERMABOND ADVANCED .7 DNX12 (GAUZE/BANDAGES/DRESSINGS) ×2 IMPLANT
DRAIN CHANNEL 15F RND FF W/TCR (WOUND CARE) ×1 IMPLANT
DRAPE C-ARM 42X72 X-RAY (DRAPES) ×4 IMPLANT
DRAPE ORTHO SPLIT 77X108 STRL (DRAPES) ×3
DRAPE POUCH INSTRU U-SHP 10X18 (DRAPES) ×4 IMPLANT
DRAPE SURG ORHT 6 SPLT 77X108 (DRAPES) ×2 IMPLANT
DRAPE U-SHAPE 47X51 STRL (DRAPES) ×6 IMPLANT
DRSG MEPILEX BORDER 4X8 (GAUZE/BANDAGES/DRESSINGS) ×2 IMPLANT
DURAPREP 26ML APPLICATOR (WOUND CARE) ×3 IMPLANT
ELECT BLADE 4.0 EZ CLEAN MEGAD (MISCELLANEOUS) ×3
ELECT CAUTERY BLADE 6.4 (BLADE) ×3 IMPLANT
ELECT REM PT RETURN 9FT ADLT (ELECTROSURGICAL) ×3
ELECTRODE BLDE 4.0 EZ CLN MEGD (MISCELLANEOUS) ×2 IMPLANT
ELECTRODE REM PT RTRN 9FT ADLT (ELECTROSURGICAL) ×2 IMPLANT
EVACUATOR SILICONE 100CC (DRAIN) ×1 IMPLANT
GAUZE SPONGE 4X4 16PLY XRAY LF (GAUZE/BANDAGES/DRESSINGS) ×2 IMPLANT
GLOVE BIOGEL PI IND STRL 6.5 (GLOVE) ×2 IMPLANT
GLOVE BIOGEL PI IND STRL 8.5 (GLOVE) ×2 IMPLANT
GLOVE BIOGEL PI INDICATOR 6.5 (GLOVE) ×2
GLOVE BIOGEL PI INDICATOR 8.5 (GLOVE) ×2
GLOVE ECLIPSE 6.0 STRL STRAW (GLOVE) ×4 IMPLANT
GLOVE ECLIPSE 7.5 STRL STRAW (GLOVE) ×2 IMPLANT
GLOVE ECLIPSE 8.5 STRL (GLOVE) ×5 IMPLANT
GOWN PREVENTION PLUS XXLARGE (GOWN DISPOSABLE) ×4 IMPLANT
GOWN STRL NON-REIN LRG LVL3 (GOWN DISPOSABLE) ×5 IMPLANT
GUIDEWIRE PIPELINE IS BLUNT (WIRE) ×1 IMPLANT
KIT BASIN OR (CUSTOM PROCEDURE TRAY) ×3 IMPLANT
KIT ORACLE NEUROMONITING (KITS) ×1 IMPLANT
KIT ROOM TURNOVER OR (KITS) ×3 IMPLANT
MIX DBX 10CC 35% BONE (Bone Implant) ×2 IMPLANT
NDL I-PASS III (NEEDLE) IMPLANT
NDL SPNL 18GX3.5 QUINCKE PK (NEEDLE) ×2 IMPLANT
NEEDLE 22X1 1/2 (OR ONLY) (NEEDLE) ×3 IMPLANT
NEEDLE I-PASS III (NEEDLE) IMPLANT
NEEDLE SPNL 18GX3.5 QUINCKE PK (NEEDLE) ×3 IMPLANT
NS IRRIG 1000ML POUR BTL (IV SOLUTION) ×4 IMPLANT
ORACLE NEUROMONITORING KIT ×1 IMPLANT
PACK LAMINECTOMY ORTHO (CUSTOM PROCEDURE TRAY) ×4 IMPLANT
PACK UNIVERSAL I (CUSTOM PROCEDURE TRAY) ×4 IMPLANT
PAD ARMBOARD 7.5X6 YLW CONV (MISCELLANEOUS) ×8 IMPLANT
ROD EXPEDIUM 5.5 300MM (Rod) ×2 IMPLANT
SCREW MATRIX MIS 6.0X40MM (Screw) ×2 IMPLANT
SPONGE INTESTINAL PEANUT (DISPOSABLE) ×4 IMPLANT
SPONGE LAP 4X18 X RAY DECT (DISPOSABLE) ×3 IMPLANT
SPONGE SURGIFOAM ABS GEL 100 (HEMOSTASIS) ×1 IMPLANT
STRIP CLOSURE SKIN 1/2X4 (GAUZE/BANDAGES/DRESSINGS) IMPLANT
SURGIFLO TRUKIT (HEMOSTASIS) ×1 IMPLANT
SUT MNCRL AB 3-0 PS2 18 (SUTURE) ×3 IMPLANT
SUT PROLENE 5 0 C 1 24 (SUTURE) IMPLANT
SUT SILK 2 0 TIES 10X30 (SUTURE) ×3 IMPLANT
SUT SILK 3 0 TIES 10X30 (SUTURE) ×3 IMPLANT
SUT VIC AB 1 CT1 27 (SUTURE)
SUT VIC AB 1 CT1 27XBRD ANBCTR (SUTURE) ×4 IMPLANT
SUT VIC AB 1 CTX 36 (SUTURE)
SUT VIC AB 1 CTX36XBRD ANBCTR (SUTURE) ×4 IMPLANT
SUT VIC AB 2-0 CT1 18 (SUTURE) ×4 IMPLANT
SYR BULB IRRIGATION 50ML (SYRINGE) ×3 IMPLANT
SYR CONTROL 10ML LL (SYRINGE) ×3 IMPLANT
TAP CANN 5MM (TAP) ×1 IMPLANT
TAPE CLOTH 4X10 WHT NS (GAUZE/BANDAGES/DRESSINGS) ×6 IMPLANT
TOWEL OR 17X24 6PK STRL BLUE (TOWEL DISPOSABLE) ×3 IMPLANT
TOWEL OR 17X26 10 PK STRL BLUE (TOWEL DISPOSABLE) ×3 IMPLANT
TRAY FOLEY CATH 14FRSI W/METER (CATHETERS) ×3 IMPLANT
WATER STERILE IRR 1000ML POUR (IV SOLUTION) ×2 IMPLANT
locking cap ×2 IMPLANT

## 2011-08-28 NOTE — Anesthesia Procedure Notes (Addendum)
Procedure Name: Intubation Date/Time: 08/28/2011 10:41 AM Performed by: Margaree Mackintosh Pre-anesthesia Checklist: Patient identified, Emergency Drugs available, Suction available, Patient being monitored and Timeout performed Patient Re-evaluated:Patient Re-evaluated prior to inductionOxygen Delivery Method: Circle System Utilized Preoxygenation: Pre-oxygenation with 100% oxygen Intubation Type: IV induction Ventilation: Mask ventilation without difficulty and Oral airway inserted - appropriate to patient size   Date/Time: 08/28/2011 10:47 AM Performed by: Margaree Mackintosh Grade View: Grade III Tube size: 8.0 mm Number of attempts: 1 Airway Equipment and Method: stylet and video-laryngoscopy Placement Confirmation: ETT inserted through vocal cords under direct vision,  positive ETCO2 and breath sounds checked- equal and bilateral Secured at: 21 cm Tube secured with: Tape Difficulty Due To: Difficult Airway- due to reduced neck mobility

## 2011-08-28 NOTE — Preoperative (Signed)
Beta Blockers   Reason not to administer Beta Blockers:Not Applicable. Metoprolol 25 mg @ 0700 08/28/11

## 2011-08-28 NOTE — Transfer of Care (Signed)
Immediate Anesthesia Transfer of Care Note  Patient: Tyler Russell  Procedure(s) Performed:  ANTERIOR LATERAL LUMBAR FUSION 1 LEVEL - Lateral L3-4 Fusion Posterior Spinal Fusion L3-4, Possible Removal of Hardware ; POSTERIOR LUMBAR FUSION 1 WITH HARDWARE REMOVAL - posterior lumbar L3-4 fusion with Hardware Removal  Patient Location: PACU  Anesthesia Type: General  Level of Consciousness: sedated  Airway & Oxygen Therapy: Patient Spontanous Breathing and Patient connected to nasal cannula oxygen  Post-op Assessment: Report given to PACU RN, Post -op Vital signs reviewed and stable and Patient moving all extremities X 4  Post vital signs: Reviewed and stable  Complications: No apparent anesthesia complications

## 2011-08-28 NOTE — Anesthesia Postprocedure Evaluation (Signed)
  Anesthesia Post-op Note  Patient: Atilano Median  Procedure(s) Performed:  ANTERIOR LATERAL LUMBAR FUSION 1 LEVEL - Lateral L3-4 Fusion Posterior Spinal Fusion L3-4, Possible Removal of Hardware ; POSTERIOR LUMBAR FUSION 1 WITH HARDWARE REMOVAL - posterior lumbar L3-4 fusion with Hardware Removal  Patient Location: PACU  Anesthesia Type: General  Level of Consciousness: sedated  Airway and Oxygen Therapy: Patient Spontanous Breathing and Patient connected to nasal cannula oxygen  Post-op Pain: mild  Post-op Assessment: Post-op Vital signs reviewed, Patient's Cardiovascular Status Stable, Respiratory Function Stable, Patent Airway, No signs of Nausea or vomiting and Pain level controlled  Post-op Vital Signs: Reviewed and stable  Complications: No apparent anesthesia complications

## 2011-08-28 NOTE — OR Nursing (Signed)
XLIF procedure ended at 1315 And Post. Fusion started @ 1353

## 2011-08-28 NOTE — Brief Op Note (Signed)
08/28/2011  4:55 PM  PATIENT:  Tyler Russell  55 y.o. 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   PROCEDURE:  Procedure(s): ANTERIOR LATERAL LUMBAR FUSION 1 LEVEL POSTERIOR LUMBAR FUSION 1 WITH HARDWARE REMOVAL  SURGEON:  Surgeon(s): Neftali Abair D Xanthe Couillard D Mikya Don  PHYSICIAN ASSISTANT:   ASSISTANTS: Norval Gable   ANESTHESIA:   general  EBL:  Total I/O In: 4300 [I.V.:3800; IV Piggyback:500] Out: 825 [Urine:475; Blood:350]  BLOOD ADMINISTERED:none  DRAINS: (JP drain ) Hemovact drain(s) in the poserior incision with  Suction Off   LOCAL MEDICATIONS USED:  MARCAINE 10CC  SPECIMEN:  No Specimen  DISPOSITION OF SPECIMEN:  N/A  COUNTS:  YES  TOURNIQUET:  * No tourniquets in log *  DICTATION: .Dragon Dictation  PLAN OF CARE: Admit to inpatient   PATIENT DISPOSITION:  PACU - hemodynamically stable.   Delay start of Pharmacological VTE agent (>24hrs) due to surgical blood loss or risk of bleeding:  {YES/NO/NOT APPLICABLE:20182

## 2011-08-28 NOTE — Op Note (Signed)
OPERATIVE REPORT  DATE OF SURGERY: 08/28/2011  PATIENT NAME:  Tyler Russell MRN: [id number] DOB: 12-Aug-1956  PCP: No primary provider on file.  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  Posterior segmental instrumentation L3 to existing posterior pedicle screw rod construct  Posterior lateral arthrodesis L3-4.  SURGEON:  Venita Lick, MD  PHYSICIAN ASSISTANT: Norval Gable   ANESTHESIA:   General  EBL: 350 ml   BRIEF HISTORY: Tyler Russell is a 55 y.o. male who had a previous L3-S1 instrumented fusion in the past. He continued to have debilitating back buttock and bilateral leg pain. Preoperative discogram CT scan and MRI confirmed adjacent segment degenerative disease symptomatic discogenic back pain and L3-4. Attempts at conservative management consisting of physical therapy anti-inflammatory medications narcotic medications injection therapy activity modification have failed to alleviate his symptoms. As a result of the failure of conservative management he elected to see in the surgical management all appropriate risks benefits and alternatives to surgery were discussed with the patient and consent was obtained.  PROCEDURE DETAILS: Patient was brought into the operating room. After successful induction of general anesthesia and tracheal intubation a Time Out was done. This confirmed all pertinent important data. Ted's and SCDs and Foley were inserted and all appropriate preoperative monitoring needles were placed by the nurse to representative  Patient was turned into the left lateral decubitus position. Axillary roll was placed all bony prominences well-padded and secured to the table. The table was broken and was placed into the proper position for a lateral approach spine. X-ray was used to  confirm satisfactory position of the patient in both the AP and lateral planes.  The back was prepped and draped in a standard fashion. X-ray was then used to identify the anterior and posterior margins of the vertebral body and skin incision was infiltrated with quarter percent Marcaine with epinephrine.  The incision was made over the lateral aspect of the S. over the flap over the lateral aspect the spine. Sharp dissection was carried out down to the deep adipose tissue to the fascia of the external oblique. A second incision was made 1 fingerbreadth posterior to the first this a small incision used with my finger through. I was able to palpate the retroperitoneal fascia through this incision I then used daily hook her to popped through the retroperitoneal fascia and then pushed my finger through that defect and palpated the lateral surface of the psoas muscle. I then began mobilizing retroperitoneal tissue in order to make way for the MIS retractor system area once I had the mobilize retroperitoneal space I then brought my finger up to the undersurface of the external oblique then bluntly dissected through the oblique dividing the fibers until I could see the original I then passed the first trocar through down to the surface of the psoas I then probed so as to ensure that I was not to compromise the lumbar plexus. Once I confirmed I went through the psoas down to the lateral aspect of the disc space. I then circumferentially stem the psoas to confirm that the lumbar plexus was not being traumatized. Once I confirmed this I then sequentially dilated to the final working to the final level each time checking circumferentially with real-time nerve stem confirming that the lumbar plexus was not being damage. I then placed the retractor system over this  and secured to the table. I then confirm satisfactory position of the retractor and then gently repositioned. I then probed behind each of the blades again  confirming no direct compression to the lumbar plexus. I then placed the interdiscal trocar blade and 2 locking retractor placed the  At this point I then incised the annulus with a 10 blade scalpel and then using a combination of Cobb elevators curettes pituitary rongeurs Kerrison rongeurs I removed all the disc space. Care was taken not to violate the anterior posterior longitudinal ligament I then released the annulus from the contralateral side. I then rasped endplate and add leading subchondral bone. I then trialed sequentially the interbody spacers and elected use the Depew carbon fiber radiolucent cage size 10 parallel. This was packed with DBX mix and malleted to the appropriate depth. I had excellent fixation x-rays were satisfactory and there was no  abnormalities noted by the neuro stem monitor.  With the cage and placed I then irrigated the wound copiously normal saline and gently removed the retractor checking to make Surette hemostasis. I then closed the fascia of the external oblique with 0 Vicryl suture interrupted into a ventilator 2-0 Vicryl sutures and 3-0 Monocryl the skin. The other incision I and made I also irrigated and closed in similar fashion. At this point time the patient was then transferred to the [place] table and placed prone onto the [place] frame. The Thompson well-padded back was prepped and draped in a standard fashion.  A midline incision was made utilizing his previous incision and extending.. I sharply dissected down to the deep fascia incised the fascia and then exposed the L3 spinous process and the L3-4 facet complex. This allowed me to this was done using bipolar Bovie as well as Cobb elevators. I then exposed the transverse process of L3. I then palpated inferiorly to the field the L4 pedicle. I then mobilized the paraspinal muscles to expose the hardware at L4-5. Once I had mobilize the scar tissue and muscular tissue over the instrumentation I have repeated the  procedure on the contralateral side. At this point I now exposed from the L3 to the S1 pedicle. I had good visualization of the cross-link. I then dissected along the surface of the cross-link until I could visualize the entire cross-link.  I attempted to remove the cross-link after loosening the connections to the rod but it was quite adherent. Fear of it traumatizing the thecal sac I stopped so as to prevent a CSF leak. At this point since I then exposed the rod between the L5 and S1 pedicle screws as this would be the location to place the leaking device for extending the pedicle screw construct to the L3 level .  Using a Cobb and curettes as well as a high-speed bur to remove the overlying bone and exposed along its entirety and between the L5 and S1 pedicle screws. I then placed a I then began to work on placing the L3 pedicle screws.  I having identified the transverse process I traced it back to where it intersected with the facet complex and used an awl to broach the cortex and then used the pedicle probe and advanced it down to the posterior margin vertebral body I confirm this trajectory using fluoroscopy. I then advanced it beyond the posterior wall. I then palpated the hole with a ball-tip feeler and intact. The Was sent the same trajectory as the of pedicle probe and then connected the To the nerve stimulating device.  There was no abnormal activity on the neuro monitoring device to indicate pedicle breach. After I placed a tap and remove it I then reprobed the hole with a ball-tip feeler.  This confirmed I had a solid bony canal with no breech.  I then placed the pedicle screw. I again stimulated the screw to confirm that there was no electrodiagnostic evidence of pedicle breach. Intraoperative x-rays also confirm satisfactory position of the screw.  At this point using the same technique that had used at night 1 side I placed the contralateral pedicle screw. Again at each stage I was palpating  the hole to ensure that there was a solid bony canal and using fluoroscopy as well as the free running EMG monitoring. At no point was there evidence of pedicle breach. Final screw was also tested and there was no neurodiagnostic evidence of breech. With both L3 pedicle screws placed and then used the linking devices to the attached to the pedicles the the rod between L5 and S1 pedicle screws. I then cut and contoured a connecting rod and secured it to the L3 pedicle screw as well and to the linking device. This was done bilaterally and all screws were torqued appropriately. At this point I have satisfactory extension of the L4-S1 instrumented fusion to the L3 level. I decorticated the transverse processes with a high-speed bur placed the DBX mix bone grafting the posterior lateral gutter. At this point I then extended my posterior lateral space up to the L3 level. The wound was then copious irrigated with normal saline hemostasis was obtained using bipolar cautery. Because the length of surgery and the sizing incision I placed a gram of vancomycin powder to help prevent surgical site wound infection. I then closed the deep fascia over a drain with interrupted #1 Vicryl sutures then a layer of 2-0 Vicryl sutures and a 3-0 Monocryl. Steri-Strips dry dressing were applied the patient was extubated and transferred to PACU without incident. In the case all needle sponge counts were correct. There were no adverse intraoperative events.   Venita Lick, MD 08/28/2011 4:33 PM

## 2011-08-29 ENCOUNTER — Encounter (HOSPITAL_COMMUNITY): Payer: Self-pay | Admitting: Orthopedic Surgery

## 2011-08-29 MED ORDER — ONDANSETRON HCL 4 MG PO TABS: 4.0000 mg | ORAL_TABLET | Freq: Three times a day (TID) | ORAL | Status: AC | PRN

## 2011-08-29 MED ORDER — POLYETHYLENE GLYCOL 3350 17 G PO PACK: 17.0000 g | PACK | Freq: Every day | ORAL | Status: AC

## 2011-08-29 MED ORDER — OXYCODONE HCL 5 MG PO TABS: 10.0000 mg | ORAL_TABLET | ORAL | Status: AC | PRN

## 2011-08-29 MED ORDER — CYCLOBENZAPRINE HCL 10 MG PO TABS: 10.0000 mg | ORAL_TABLET | Freq: Three times a day (TID) | ORAL | Status: AC | PRN

## 2011-08-29 NOTE — Progress Notes (Addendum)
Subjective: Procedure(s) (LRB): ANTERIOR LATERAL LUMBAR FUSION 1 LEVEL (N/A) POSTERIOR LUMBAR FUSION 1 WITH HARDWARE REMOVAL (Bilateral) 1 Day Post-Op  Patient reports pain as mild.  Reports none leg pain reports incisional back pain   Positive void Negative bowel movement Positive flatus Negative chest pain or shortness of breath  Objective: Vital signs in last 24 hours: Temp:  [97.6 F (36.4 C)-98.8 F (37.1 C)] 97.6 F (36.4 C) (12/14 0534) Pulse Rate:  [90-127] 108  (12/14 0534) Resp:  [6-25] 18  (12/14 0534) BP: (127-151)/(56-101) 140/79 mmHg (12/14 0534) SpO2:  [93 %-100 %] 98 % (12/14 0534) Weight:  [83.6 kg (184 lb 4.9 oz)] 184 lb 4.9 oz (83.6 kg) (12/13 2142)  Intake/Output from previous day: 12/13 0701 - 12/14 0700 In: 5870 [P.O.:720; I.V.:4650; IV Piggyback:500] Out: 4130 [Urine:3525; Drains:255; Blood:350]  No results found for this basename: WBC:2,RBC:2,HCT:2,PLT:2 in the last 72 hours No results found for this basename: NA:2,K:2,CL:2,CO2:2,BUN:2,CREATININE:2,GLUCOSE:2,CALCIUM:2 in the last 72 hours No results found for this basename: LABPT:2,INR:2 in the last 72 hours  ABD soft Neurovascular intact Sensation intact distally Intact pulses distally Dorsiflexion/Plantar flexion intact Incision: dressing C/D/I Compartment soft  Assessment/Plan: Patient stable  Drain: 175 out over night; should remain in place until after therapy Continue care  Up with therapy Discharge home with home health likely Saturday Discharge paperwork and Rx on chart  Gwinda Maine 08/29/2011, 7:37 AM  Agree with above Doing well Mobilization xrays pending Possible d/c Saturday

## 2011-08-29 NOTE — Discharge Summary (Signed)
Patient ID: Tyler Russell MRN: [id number] DOB/AGE: Jan 27, 1956 55 y.o.  Admit date: 08/28/2011 Discharge date: 08/30/2011  Admission Diagnoses:  Active Problems:  adjacent segment DDD with  discogenic low back pain s/p lumbar fusion   Discharge Diagnoses:  Same  Past Medical History  Diagnosis Date  . Coronary artery disease   . Hypercholesteremia     no medication at this time  . Chronic kidney disease   . Ulcer     on medication  . Headache   . Arthritis   . Complication of anesthesia     had difficulty with breathing, had difficulty waking up  . Trigger finger     on both hands    Surgeries: Procedure(s): ANTERIOR LATERAL LUMBAR FUSION 1 LEVEL POSTERIOR LUMBAR FUSION 1 WITH HARDWARE REMOVAL on 08/28/2011   Consultants:    Discharged Condition: Improved  Hospital Course: Tyler Russell is an 55 y.o. male who was admitted 08/28/2011 for operative treatment of adjacent segment DDD with discogenic low back pain s/p lumbar fusion. Patient has severe unremitting pain that affects sleep, daily activities, and work/hobbies. After pre-op clearance the patient was taken to the operating room on 08/28/2011 and underwent  Procedure(s): ANTERIOR LATERAL LUMBAR FUSION 1 LEVEL POSTERIOR LUMBAR FUSION 1 WITH HARDWARE REMOVAL.    Patient was given perioperative antibiotics: Anti-infectives     Start     Dose/Rate Route Frequency Ordered Stop   08/28/11 1730   ceFAZolin (ANCEF) IVPB 1 g/50 mL premix        1 g 100 mL/hr over 30 Minutes Intravenous 3 times per day 08/28/11 1717 08/29/11 0118   08/28/11 1500   vancomycin (VANCOCIN) powder 1,000 mg        1,000 mg Other To Surgery 08/28/11 1447 08/28/11 1534   08/28/11 0600   ceFAZolin (ANCEF) IVPB 2 g/50 mL premix        2 g 100 mL/hr over 30 Minutes Intravenous 60 min pre-op 08/27/11 1429 08/28/11 1105   08/27/11 1430   ceFAZolin (ANCEF) IVPB 2 g/50 mL premix  Status:  Discontinued        2 g 100 mL/hr over 30 Minutes  Intravenous 60 min pre-op 08/27/11 1429 08/27/11 1429           Patient was given sequential compression devices and early ambulation to prevent DVT.  Patient benefited maximally from hospital stay and there were no complications.    Recent vital signs: Patient Vitals for the past 24 hrs:  BP Temp Temp src Pulse Resp SpO2 Height Weight  08/29/11 0534 140/79 mmHg 97.6 F (36.4 C) - 108  18  98 % - -  08/28/11 2142 - - - - - - 5\' 5"  (1.651 m) 83.6 kg (184 lb 4.9 oz)  08/28/11 2057 127/56 mmHg 98.8 F (37.1 C) - 127  18  95 % - -  08/28/11 1945 - 97.6 F (36.4 C) - - - - - -  08/28/11 1849 - - - 118  25  100 % - -  08/28/11 1848 - - - 120  17  100 % - -  08/28/11 1847 - - - 120  15  100 % - -  08/28/11 1846 - - - 120  20  100 % - -  08/28/11 1845 - - - 121  25  100 % - -  08/28/11 1844 - - - 121  19  100 % - -  08/28/11 1843 - - - 119  13  100 % - -  08/28/11 1842 - - - 119  15  100 % - -  08/28/11 1841 139/74 mmHg - - 118  22  100 % - -  08/28/11 1840 - - - 120  22  100 % - -  08/28/11 1839 - - - 119  17  100 % - -  08/28/11 1838 - - - 119  15  100 % - -  08/28/11 1837 - - - 119  11  100 % - -  08/28/11 1836 - - - 119  14  100 % - -  08/28/11 1835 - - - 118  8  100 % - -  08/28/11 1834 - - - 118  8  100 % - -  08/28/11 1833 - - - 118  11  100 % - -  08/28/11 1832 - - - 120  19  100 % - -  08/28/11 1831 - - - 120  16  100 % - -  08/28/11 1830 - - - 118  13  100 % - -  08/28/11 1829 - - - 119  12  100 % - -  08/28/11 1828 - - - 118  10  100 % - -  08/28/11 1827 - - - 119  11  100 % - -  08/28/11 1826 146/86 mmHg - - 119  14  100 % - -  08/28/11 1825 - - - 119  23  100 % - -  08/28/11 1824 - - - 119  10  100 % - -  08/28/11 1823 - - - 119  16  100 % - -  08/28/11 1822 - - - 119  20  100 % - -  08/28/11 1821 - - - 119  15  100 % - -  08/28/11 1820 - - - 119  19  100 % - -  08/28/11 1819 - - - 119  15  100 % - -  08/28/11 1818 - - - 119  17  100 % - -  08/28/11 1817 - - -  119  16  100 % - -  08/28/11 1816 - - - 118  14  100 % - -  08/28/11 1815 - - - 117  10  100 % - -  08/28/11 1814 - - - 119  12  100 % - -  08/28/11 1813 - - - 118  13  100 % - -  08/28/11 1812 - - - 118  13  100 % - -  08/28/11 1811 150/88 mmHg - - 118  13  100 % - -  08/28/11 1810 - - - 119  16  100 % - -  08/28/11 1809 - - - 118  14  100 % - -  08/28/11 1808 - - - 118  15  100 % - -  08/28/11 1807 - - - 119  16  100 % - -  08/28/11 1806 - - - 119  18  100 % - -  08/28/11 1805 - - - 118  15  100 % - -  08/28/11 1804 - - - 118  17  100 % - -  08/28/11 1803 - - - 118  17  100 % - -  08/28/11 1802 - - - 118  12  100 % - -  08/28/11 1801 - - - 118  15  100 % - -  08/28/11 1800 - - - 117  14  100 % - -  08/28/11 1759 - - - 118  14  100 % - -  08/28/11 1758 - - - 118  16  100 % - -  08/28/11 1757 - - - 119  19  100 % - -  08/28/11 1756 132/72 mmHg - - 117  13  100 % - -  08/28/11 1755 - - - 117  12  100 % - -  08/28/11 1754 - - - 117  13  100 % - -  08/28/11 1753 - - - 117  13  100 % - -  08/28/11 1752 - - - 117  12  100 % - -  08/28/11 1751 - - - 116  13  100 % - -  08/28/11 1750 - - - 117  13  100 % - -  08/28/11 1749 - - - 116  13  100 % - -  08/28/11 1748 - - - 116  13  100 % - -  08/28/11 1747 - - - 116  12  100 % - -  08/28/11 1746 - - - 116  13  100 % - -  08/28/11 1745 - - - 116  13  100 % - -  08/28/11 1744 - - - 116  13  100 % - -  08/28/11 1743 - - - 116  13  100 % - -  08/28/11 1742 - - - 115  13  100 % - -  08/28/11 1741 129/69 mmHg - - 115  13  100 % - -  08/28/11 1740 - - - 115  13  100 % - -  08/28/11 1739 - - - 115  13  99 % - -  08/28/11 1738 - - - 115  13  99 % - -  08/28/11 1737 - - - 115  13  98 % - -  08/28/11 1736 - - - 116  13  97 % - -  08/28/11 1735 - - - 115  13  97 % - -  08/28/11 1734 - - - 115  13  97 % - -  08/28/11 1733 - - - 114  14  97 % - -  08/28/11 1732 - - - 114  13  97 % - -  08/28/11 1731 - - - 114  13  97 % - -  08/28/11 1730 - - -  114  13  97 % - -  08/28/11 1729 - - - 114  13  96 % - -  08/28/11 1728 - - - 114  12  96 % - -  08/28/11 1727 - - - 114  13  95 % - -  08/28/11 1726 130/66 mmHg - - 114  13  94 % - -  08/28/11 1725 - - - 114  13  93 % - -  08/28/11 1724 - - - 113  14  93 % - -  08/28/11 1723 - - - 113  13  96 % - -  08/28/11 1722 - - - 114  15  98 % - -  08/28/11 1721 - - - 113  13  96 % - -  08/28/11 1720 - - - 114  15  97 % - -  08/28/11 1719 - - - 114  14  96 % - -  08/28/11 1718 - - - 112  7  93 % - -  08/28/11 1717 - - -  113  12  98 % - -  08/28/11 1716 - - - 113  6  98 % - -  08/28/11 1715 - 98.4 F (36.9 C) - 113  14  100 % - -  08/28/11 1714 - - - 114  15  99 % - -  08/28/11 1713 - - - 113  10  98 % - -  08/28/11 1712 - - - 113  15  97 % - -  08/28/11 1711 135/74 mmHg - - - - - - -  08/28/11 0843 151/101 mmHg 98.1 F (36.7 C) Oral 90  20  98 % - -     Recent laboratory studies: No results found for this basename: WBC:2,HGB:2,HCT:2,PLT:2,NA:2,K:2,CL:2,CO2:2,BUN:2,CREATININE:2,GLUCOSE:2,PT:2,INR:2,CALCIUM,2: in the last 72 hours   Discharge Medications:  Current Discharge Medication List    START taking these medications   Details  ondansetron (ZOFRAN) 4 MG tablet Take 1 tablet (4 mg total) by mouth every 8 (eight) hours as needed for nausea. Qty: 20 tablet, Refills: 0    oxyCODONE (OXY IR/ROXICODONE) 5 MG immediate release tablet Take 2 tablets (10 mg total) by mouth every 4 (four) hours as needed for pain. Qty: 120 tablet, Refills: 0    polyethylene glycol (MIRALAX) packet Take 17 g by mouth daily. Qty: 14 each, Refills: 0      CONTINUE these medications which have CHANGED   Details  cyclobenzaprine (FLEXERIL) 10 MG tablet Take 1 tablet (10 mg total) by mouth 3 (three) times daily as needed for muscle spasms. Qty: 90 tablet, Refills: 0      CONTINUE these medications which have NOT CHANGED   Details  aspirin 81 MG tablet Take 81 mg by mouth daily.      metoprolol tartrate  (LOPRESSOR) 25 MG tablet Take 25 mg by mouth 2 (two) times daily.      omeprazole (PRILOSEC) 20 MG capsule Take 20 mg by mouth 2 (two) times daily.        STOP taking these medications     oxycodone (OXY-IR) 5 MG capsule         Diagnostic Studies: Dg Lumbar Spine 2-3 Views  08/28/2011  *RADIOLOGY REPORT*  Clinical Data: Lumbar fusion  LUMBAR SPINE - 2-3 VIEW  Comparison: T12  Findings: Two spot fluoro images are submitted after the procedure for review.  Bilateral pedicle screws are seen at L3, L4, of five, and S1.  Interbody spacers are seen at the L3-4 level, L4-5, L5-S1. The screws at the L3 level are new in the interval.  No evidence for complicating features.  IMPRESSION: Intraoperative management.  Original Report Authenticated By: ERIC A. MANSELL, M.D.   Dg Lumbar Spine 2-3 Views  08/28/2011  *RADIOLOGY REPORT*  Clinical Data: Low back pain; preop for numbering levels; L3-L4 fusion  LUMBAR SPINE - 2-3 VIEW  Comparison: CT dated June 20, 2011  Findings: The AP alignment is normal.  There is grade 1 retrolisthesis of L3 on L4.  Posterior rod and pedicle screw fusion of L4-S1 is present.  The vertebral body heights are maintained. The disc spaces are maintained with graft material at L4-L5 and L5- S1.  IMPRESSION: There is grade 1 retrolisthesis of L3 on L4.  Original Report Authenticated By: Brandon Melnick, M.D.   Dg C-arm Gt 120 Min  08/28/2011  CLINICAL DATA: xlif, posterior screws L3-4   C-ARM GT 120 MIN  Fluoroscopy was utilized by the requesting physician.  No radiographic  interpretation.  Disposition: Home or Self Care  Discharge Orders    Future Orders Please Complete By Expires   Diet - low sodium heart healthy      Call MD / Call 911      Comments:   If you experience chest pain or shortness of breath, CALL 911 and be transported to the hospital emergency room.  If you develope a fever above 101 F, pus (white drainage) or increased drainage or redness at the  wound, or calf pain, call your surgeon's office.   Constipation Prevention      Comments:   Drink plenty of fluids.  Prune juice may be helpful.  You may use a stool softener, such as Colace (over the counter) 100 mg twice a day.  Use MiraLax (over the counter) for constipation as needed.   Increase activity slowly as tolerated      Weight Bearing as taught in Physical Therapy      Comments:   Use a walker or crutches as instructed.   Discharge wound care:      Comments:   Keep incision clean and dry.  Change your bandage as instructed by your health care providers.  If your bandage has been discontinued, keep your incision clean and dry.  May shower on Tuesday, pat dry after bathing.  DO NOT put lotion, ointment or powder on your incision.   Home Health      Questions: Responses:   To provide the following care/treatments PT   Face-to-face encounter      Comments:   I Gwinda Maine certify that this patient is under my care and that I, or a nurse practitioner or physician's assistant working with me, had a face-to-face encounter that meets the physician face-to-face encounter requirements with this patient on 08/29/2011.       Questions: Responses:   The encounter with the patient was in whole, or in part, for the following medical condition, which is the primary reason for home health care adjacent segment DDD with discogenic back pain s/p lumbar fusion   I certify that, based on my findings, the following services are medically necessary home health services Physical therapy   My clinical findings support the need for the above services OTHER SEE COMMENTS   Further, I certify that my clinical findings support that this patient is homebound (i.e. absences from home require considerable and taxing effort and are for medical reasons or religious services or infrequently or of short duration when for other reasons) OTHER SEE COMMENTS   To provide the following care/treatments PT       Follow-up Information    Follow up with BROOKS,DAHARI D in 2 weeks.   Contact information:   [facility] [address] [place] [address] [phone]         Discharge Plan: DC to home on 08/30/2011 with [facility] Disposition at time of DC:  STABLE  Signed: Gwinda Maine 08/29/2011, 8:39 AM

## 2011-08-29 NOTE — Progress Notes (Addendum)
Pt is s/p anterior lateral lumbar fusion L3-L4. Pt is to have on corset brace when up in room. Pt has +cms and neuro check is negative. Pt repts that preop he had BLE pain, weakness, numbness and low back pain. Pt repts that preop s/sx are "better" postop. Pt voids without difficulty. Pt is passing gas and denies nausea or vomiting. Pt tolerates diet fair to good. Pt repts LBM 12/13. Pt pain is controlled with PO pain medications. Pt is progressing with therapy. Pt one incision mid low back and one lateral left back. Both incisions have mepilex dressings that are dry and intact. Pt has decreased ROM of back related to surgery. Pt ambulates with steady gait. Pt heart rate regular rate and rhythm. Pt heart rate runs in the low 100's. Pt repts that that is "normal" for him. No s/sx of resp or cardiac distress and no c/o such.  Pt has a JP to his back incision that is draining small amount of bldy drainage.

## 2011-08-29 NOTE — Progress Notes (Signed)
Occupational Therapy Evaluation Patient Details Name: Tyler Russell MRN: [id number] DOB: 1955-10-09 Today's Date: 08/29/2011  Problem List: There is no problem list on file for this patient.   Past Medical History:  Past Medical History  Diagnosis Date  . Coronary artery disease   . Hypercholesteremia     no medication at this time  . Chronic kidney disease   . Ulcer     on medication  . Headache   . Arthritis   . Complication of anesthesia     had difficulty with breathing, had difficulty waking up  . Trigger finger     on both hands   Past Surgical History:  Past Surgical History  Procedure Date  . Knee surgery     left  . Cervical disc surgery     3 ruptured disc, 2012  . Lumbar fusion     2008    OT Assessment/Plan/Recommendation OT Assessment Clinical Impression Statement: Pt will benefit from acute OT to increase I with ADLs and functional transfes in prep for d/c home with wife OT Recommendation/Assessment: Patient will need skilled OT in the acute care venue OT Problem List: Decreased activity tolerance;Decreased knowledge of use of DME or AE;Decreased knowledge of precautions;Pain OT Therapy Diagnosis : Acute pain OT Plan OT Frequency: Min 2X/week OT Treatment/Interventions: Self-care/ADL training;DME and/or AE instruction;Therapeutic activities;Patient/family education OT Recommendation Follow Up Recommendations: 24 hour supervision/assistance Equipment Recommended: 3 in 1 bedside comode Individuals Consulted Consulted and Agree with Results and Recommendations: Patient OT Goals Acute Rehab OT Goals OT Goal Formulation: With patient Time For Goal Achievement: 7 days ADL Goals Pt Will Perform Grooming: with modified independence;Standing at sink;Other (comment) (while maintaining back precautions) ADL Goal: Grooming - Progress: Other (comment) Pt Will Perform Lower Body Bathing: with modified independence;Sit to stand from chair;Sit to stand from  bed;with adaptive equipment ADL Goal: Lower Body Bathing - Progress: Other (comment) Pt Will Perform Lower Body Dressing: with modified independence;Sit to stand from chair;Sit to stand from bed ADL Goal: Lower Body Dressing - Progress: Other (comment) Pt Will Transfer to Toilet: with modified independence;Ambulation;with DME;3-in-1;Maintaining back safety precautions ADL Goal: Toilet Transfer - Progress: Other (comment)  OT Evaluation Precautions/Restrictions  Restrictions Weight Bearing Restrictions: No Prior Functioning Home Living Lives With: Spouse Receives Help From: Family Type of Home: Mobile home Home Layout: One level Home Access: Stairs to enter Entrance Stairs-Rails: Can reach both Entrance Stairs-Number of Steps: 6 Bathroom Shower/Tub: Walk-in shower;Door [employer] Toilet: Standard Bathroom Accessibility: Yes How Accessible: Accessible via walker Home Adaptive Equipment: None Prior Function Level of Independence: Independent with basic ADLs;Independent with gait;Independent with transfers;Independent with homemaking with ambulation Able to Take Stairs?: Yes Driving: Yes Vocation: Workers comp ADL ADL Eating/Feeding: Performed;Independent Where Assessed - Eating/Feeding: Chair Grooming: Simulated;Set up Where Assessed - Grooming: Sitting, chair;Unsupported Upper Body Bathing: Simulated;Set up Where Assessed - Upper Body Bathing: Unsupported;Sitting, chair Lower Body Bathing: Simulated;Moderate assistance Where Assessed - Lower Body Bathing: Sit to stand from chair Upper Body Dressing: Simulated;Set up Where Assessed - Upper Body Dressing: Sitting, chair Lower Body Dressing: Simulated;Moderate assistance Where Assessed - Lower Body Dressing: Sit to stand from chair Toilet Transfer: Simulated;Minimal assistance Toilet Transfer Method: Ambulating Equipment Used: Rolling walker ADL Comments: Pt with decreased I with ADLs and functional transfers due to pain and  back precautions Vision/Perception    Cognition Cognition Arousal/Alertness: Awake/alert Overall Cognitive Status: Appears within functional limits for tasks assessed Orientation Level: Oriented X4 Sensation/Coordination   Extremity Assessment RUE Assessment RUE Assessment:  Within Functional Limits LUE Assessment LUE Assessment: Within Functional Limits Mobility  Bed Mobility Bed Mobility: No Transfers Transfers: Yes Sit to Stand: 4: Min assist;With armrests;With upper extremity assist;From chair/3-in-1 Sit to Stand Details (indicate cue type and reason): VC for hand placement Stand to Sit: 4: Min assist;To chair/3-in-1;With armrests;With upper extremity assist Stand to Sit Details: VC for hand placement and controlled descent Exercises   End of Session OT - End of Session Equipment Utilized During Treatment: Back brace Activity Tolerance: Patient limited by pain Patient left: in chair;with call bell in reach;with family/visitor present General Behavior During Session: [facility] for tasks performed Cognition: [facility] for tasks performed   Cipriano Mile 08/29/2011, 12:55 PM  08/29/2011 Cipriano Mile OTR/L Pager [phone] Office [phone]

## 2011-08-29 NOTE — Progress Notes (Signed)
Physical Therapy Evaluation Patient Details Name: Tyler Russell MRN: [id number] DOB: [birth year] Today's Date: 08/29/2011  Problem List: There is no problem list on file for this patient.   Past Medical History:  Past Medical History  Diagnosis Date  . Coronary artery disease   . Hypercholesteremia     no medication at this time  . Chronic kidney disease   . Ulcer     on medication  . Headache   . Arthritis   . Complication of anesthesia     had difficulty with breathing, had difficulty waking up  . Trigger finger     on both hands   Past Surgical History:  Past Surgical History  Procedure Date  . Knee surgery     left  . Cervical disc surgery     3 ruptured disc, 2012  . Lumbar fusion     2008    PT Assessment/Plan/Recommendation PT Assessment Clinical Impression Statement: Pt presents with a medical L3-4 fusion along with the following impairments/deficits and therapy diagnosis listed below. Pt will benefit from skilled PT in the acute care setting in order to maximize functional mobility for a safe d/c home PT Recommendation/Assessment: Patient will need skilled PT in the acute care venue PT Problem List: Decreased strength;Decreased range of motion;Decreased activity tolerance;Decreased mobility;Decreased knowledge of use of DME;Decreased knowledge of precautions;Pain PT Therapy Diagnosis : Difficulty walking;Acute pain PT Plan PT Frequency: Min 5X/week PT Treatment/Interventions: DME instruction;Gait training;Functional mobility training;Stair training;Therapeutic activities;Therapeutic exercise;Patient/family education PT Recommendation Follow Up Recommendations: Home health PT;24 hour supervision/assistance Equipment Recommended: Rolling walker with 5" wheels PT Goals  Acute Rehab PT Goals PT Goal Formulation: With patient Time For Goal Achievement: 7 days Pt will go Supine/Side to Sit: with modified independence PT Goal: Supine/Side to Sit - Progress:  Progressing toward goal Pt will go Sit to Supine/Side: with modified independence PT Goal: Sit to Supine/Side - Progress: Progressing toward goal Pt will go Sit to Stand: with modified independence PT Goal: Sit to Stand - Progress: Progressing toward goal Pt will go Stand to Sit: with modified independence PT Goal: Stand to Sit - Progress: Progressing toward goal Pt will Transfer Bed to Chair/Chair to Bed: with modified independence PT Transfer Goal: Bed to Chair/Chair to Bed - Progress: Progressing toward goal Pt will Ambulate: >150 feet;with modified independence;with least restrictive assistive device PT Goal: Ambulate - Progress: Progressing toward goal Pt will Go Up / Down Stairs: 6-9 stairs;with modified independence;with rail(s) PT Goal: Up/Down Stairs - Progress: Progressing toward goal Pt will Perform Home Exercise Program: Independently PT Goal: Perform Home Exercise Program - Progress: Progressing toward goal  PT Evaluation Precautions/Restrictions  Restrictions Weight Bearing Restrictions: No Prior Functioning  Home Living Lives With: Spouse Receives Help From: Family Type of Home: Mobile home Home Layout: One level Home Access: Stairs to enter Entrance Stairs-Rails: Can reach both Entrance Stairs-Number of Steps: 6 Bathroom Shower/Tub: Walk-in shower;Door [employer] Toilet: Standard Bathroom Accessibility: Yes How Accessible: Accessible via walker Home Adaptive Equipment: None Prior Function Level of Independence: Independent with basic ADLs;Independent with gait;Independent with transfers;Independent with homemaking with ambulation Able to Take Stairs?: Yes Driving: Yes 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 Sitting - Scoot to Edge of Bed: 4: Min assist Sitting - Scoot to [place] of Bed Details (indicate cue type and reason): VC for hand placement Transfers Transfers: Yes Sit to Stand: 4: Min assist;From bed;With upper extremity assist Sit to Stand Details (indicate cue type and reason): VC for hand placement Stand to Sit: 4: Min assist;To chair/3-in-1;With armrests Stand to Sit Details: VC for hand placement. Min assist for stability into sitting Ambulation/Gait Ambulation/Gait: Yes Ambulation/Gait Assistance: 4: Min assist (Minguard assist) Ambulation/Gait Assistance Details (indicate cue type and reason): VC for proper sequencing and safety with RW Ambulation Distance (Feet): 200 Feet Assistive device: Rolling walker Gait Pattern: Step-through pattern;Within Functional Limits Gait velocity: Decreased gait speed secondary to pain Stairs: Yes Stairs Assistance: 4: Min assist Stairs Assistance Details (indicate cue type and reason): VC for sequencing and safety Stair Management Technique: Two rails Number of Stairs: 5     Exercise    End of Session PT - End of Session Equipment Utilized During Treatment: Gait belt;Back brace Activity Tolerance: Patient tolerated treatment well Patient left: in chair;with call bell in reach Nurse Communication: Mobility status for transfers;Mobility status for ambulation General Behavior During Session: [facility] for tasks performed Cognition: [facility] for tasks performed  Milana Kidney 08/29/2011, 9:27 AM  08/29/2011 Milana Kidney DPT PAGER: [phone] OFFICE: [phone]

## 2011-08-30 MED ORDER — POLYETHYLENE GLYCOL 3350 17 G PO PACK: 17.0000 g | PACK | Freq: Every day | ORAL | Status: AC

## 2011-08-30 MED ORDER — POLYETHYLENE GLYCOL 3350 17 G PO PACK
17.0000 g | PACK | Freq: Every day | ORAL | Status: DC
  Administered 2011-08-30: 17 g via ORAL
  Filled 2011-08-30 (×2): qty 1

## 2011-08-30 NOTE — Progress Notes (Signed)
Physical Therapy Treatment Patient Details Name: Tyler Russell MRN: [id number] DOB: 31-Dec-1955 Today's Date: 08/30/2011  PT Assessment/Plan  PT - Assessment/Plan Comments on Treatment Session: pt making great progress. deferred additional stair instruction today due to fatique from ambulation in halls multiple times. PT Plan: Discharge plan remains appropriate PT Frequency: Min 5X/week Follow Up Recommendations: Home health PT;24 hour supervision/assistance Equipment Recommended: 3 in 1 bedside comode PT Goals  Acute Rehab PT Goals PT Goal: Supine/Side to Sit - Progress: Met PT Goal: Sit to Supine/Side - Progress: Met PT Goal: Sit to Stand - Progress: Met PT Goal: Stand to Sit - Progress: Met PT Transfer Goal: Bed to Chair/Chair to Bed - Progress: Met PT Goal: Ambulate - Progress: Met  PT Treatment Precautions/Restrictions  Precautions Precaution Comments: pt independent with recalling 3/3 back precautions without cues/assist. Required Braces or Orthoses: Yes Spinal Brace: Lumbar corset;Applied in sitting position (pt independent with doff/don of corset) Restrictions Weight Bearing Restrictions: No Mobility (including Balance) Bed Mobility Bed Mobility: Yes Supine to Sit: 6: Modified independent (Device/Increase time);HOB flat Supine to Sit Details (indicate cue type and reason): no cues or assist needed. demo'd good logroll technique Sitting - Scoot to Edge of Bed: 6: Modified independent (Device/Increase time) Sitting - Scoot to Edge of Bed Details (indicate cue type and reason): no cues or assist needed. did need increased time. Sit to Supine - Right: 6: Modified independent (Device/Increase time);HOB flat Transfers Sit to Stand: 5: Supervision;From chair/3-in-1;From bed;With upper extremity assist Stand to Sit: 5: Supervision;To bed;To chair/3-in-1;Without upper extremity assist Ambulation/Gait Ambulation/Gait Assistance: 6: Modified independent (Device/Increase  time) Ambulation/Gait Assistance Details (indicate cue type and reason): pt observed in hall x2 ambulation with RW demoing safe technique. Assistive device: Rolling walker Gait Pattern: Step-through pattern;Within Functional Limits  Posture/Postural Control Posture/Postural Control: No significant limitations Exercise    End of Session PT - End of Session Equipment Utilized During Treatment: Gait belt;Back brace Activity Tolerance: Patient tolerated treatment well Patient left: in bed Nurse Communication: Mobility status for transfers;Mobility status for ambulation General Behavior During Session: [facility] for tasks performed Cognition: [facility] for tasks performed  Sallyanne Kuster 08/30/2011, 12:38 PM  Sallyanne Kuster, PTA Office- [phone]

## 2011-08-30 NOTE — Progress Notes (Signed)
Subjective: No c/o   Has not had BM yet  Objective: Vital signs in last 24 hours: Temp:  [97 F (36.1 C)-98 F (36.7 C)] 97.6 F (36.4 C) (12/15 0625) Pulse Rate:  [90-113] 91  (12/15 0625) Resp:  [16] 16  (12/15 0625) BP: (128-165)/(73-90) 145/81 mmHg (12/15 0625) SpO2:  [95 %-99 %] 95 % (12/15 0625)  Intake/Output from previous day: 12/14 0701 - 12/15 0700 In: 1485 [P.O.:710; I.V.:525; IV Piggyback:200] Out: -  Intake/Output this shift:    No results found for this basename: HGB:5 in the last 72 hours No results found for this basename: WBC:2,RBC:2,HCT:2,PLT:2 in the last 72 hours No results found for this basename: NA:2,K:2,CL:2,CO2:2,BUN:2,CREATININE:2,GLUCOSE:2,CALCIUM:2 in the last 72 hours No results found for this basename: LABPT:2,INR:2 in the last 72 hours  Up walking brace on with fam  Moving lower ext well Assessment/Plan: Doing well  If BM home today    All inst on chart and discussed with patient fam and nurse   Marvell Stavola ANDREW 08/30/2011, 10:36 AM

## 2011-09-01 NOTE — Progress Notes (Signed)
Spoke with Allison [place] @ [facility] and Billie Ruddy PA. The worker's comp dept of his office will be sure they get the orders for [facility].

## 2011-09-03 ENCOUNTER — Emergency Department (HOSPITAL_COMMUNITY)
Admission: EM | Admit: 2011-09-03 | Discharge: 2011-09-03 | Disposition: A | Discharge status: Home / Self Care | Payer: Worker's Compensation | Attending: Emergency Medicine | Admitting: Emergency Medicine

## 2011-09-03 ENCOUNTER — Encounter (HOSPITAL_COMMUNITY): Payer: Self-pay | Admitting: *Deleted

## 2011-09-03 ENCOUNTER — Emergency Department (HOSPITAL_COMMUNITY): Payer: Worker's Compensation

## 2011-09-03 DIAGNOSIS — R440 Auditory hallucinations: Secondary | ICD-10-CM

## 2011-09-03 DIAGNOSIS — Z87891 Personal history of nicotine dependence: Secondary | ICD-10-CM | POA: Insufficient documentation

## 2011-09-03 DIAGNOSIS — R443 Hallucinations, unspecified: Secondary | ICD-10-CM | POA: Insufficient documentation

## 2011-09-03 DIAGNOSIS — Z981 Arthrodesis status: Secondary | ICD-10-CM | POA: Insufficient documentation

## 2011-09-03 DIAGNOSIS — M549 Dorsalgia, unspecified: Secondary | ICD-10-CM | POA: Insufficient documentation

## 2011-09-03 LAB — POCT I-STAT, CHEM 8
BUN: 8 mg/dL (ref 6–23)
Chloride: 97 mEq/L (ref 96–112)
Creatinine, Ser: 0.8 mg/dL (ref 0.50–1.35)
Creatinine, Ser: 0.8 mg/dL (ref 0.50–1.35)
Glucose, Bld: 292 mg/dL — ABNORMAL HIGH (ref 70–99)
Potassium: 3.8 mEq/L (ref 3.5–5.1)
Potassium: 3.8 mEq/L (ref 3.5–5.1)
Sodium: 134 mEq/L — ABNORMAL LOW (ref 135–145)
TCO2: 28 mmol/L (ref 0–100)

## 2011-09-03 LAB — URINALYSIS, ROUTINE W REFLEX MICROSCOPIC
Bilirubin Urine: NEGATIVE
Glucose, UA: >1000 mg/dL — AB
Hgb urine dipstick: NEGATIVE
Ketones, ur: NEGATIVE mg/dL
Specific Gravity, Urine: 1.02 (ref 1.005–1.030)
pH: 7 (ref 5.0–8.0)

## 2011-09-03 LAB — RAPID URINE DRUG SCREEN, HOSP PERFORMED
Amphetamines: NOT DETECTED
Barbiturates: NOT DETECTED
Benzodiazepines: NOT DETECTED
Cocaine: NOT DETECTED
Opiates: NOT DETECTED
Tetrahydrocannabinol: NOT DETECTED

## 2011-09-03 LAB — CBC
HCT: 31.7 % — ABNORMAL LOW (ref 39.0–52.0)
Hemoglobin: 10.5 g/dL — ABNORMAL LOW (ref 13.0–17.0)
MCV: 81.1 fL (ref 78.0–100.0)
RDW: 13.4 % (ref 11.5–15.5)
WBC: 7.4 10*3/uL (ref 4.0–10.5)

## 2011-09-03 LAB — DIFFERENTIAL
Basophils Absolute: 0 10*3/uL (ref 0.0–0.1)
Eosinophils Relative: 2 % (ref 0–5)
Lymphocytes Relative: 34 % (ref 12–46)
Lymphs Abs: 2.5 10*3/uL (ref 0.7–4.0)
Monocytes Absolute: 0.8 10*3/uL (ref 0.1–1.0)
Monocytes Relative: 11 % (ref 3–12)

## 2011-09-03 MED ORDER — SODIUM CHLORIDE 0.9 % IV BOLUS (SEPSIS)
500.0000 mL | Freq: Once | INTRAVENOUS | Status: AC
  Administered 2011-09-03: 500 mL via INTRAVENOUS

## 2011-09-03 MED ORDER — FENTANYL 12 MCG/HR TD PT72: 1.0000 | MEDICATED_PATCH | TRANSDERMAL | Status: AC

## 2011-09-03 MED ORDER — ONDANSETRON HCL 4 MG/2ML IJ SOLN
4.0000 mg | Freq: Once | INTRAMUSCULAR | Status: AC
  Administered 2011-09-03: 4 mg via INTRAVENOUS
  Filled 2011-09-03: qty 2

## 2011-09-03 MED ORDER — FENTANYL 12 MCG/HR TD PT72
12.5000 ug | MEDICATED_PATCH | TRANSDERMAL | Status: DC
  Administered 2011-09-03: 12.5 ug via TRANSDERMAL

## 2011-09-03 MED ORDER — DIAZEPAM 5 MG PO TABS
10.0000 mg | ORAL_TABLET | Freq: Once | ORAL | Status: AC
  Administered 2011-09-03: 10 mg via ORAL
  Filled 2011-09-03: qty 2

## 2011-09-03 MED ORDER — DIAZEPAM 5 MG PO TABS: ORAL_TABLET | ORAL | Status: AC

## 2011-09-03 NOTE — ED Provider Notes (Signed)
Medical screening examination/treatment/procedure(s) were performed by non-physician practitioner and as supervising physician I was immediately available for consultation/collaboration.   Rainey Rodger M Valynn Schamberger, MD 09/03/11 1704 

## 2011-09-03 NOTE — ED Notes (Signed)
Patient and wife state that patient has been hallucinating since having back surgery last Thursday. This AM patient had auditory hallucinations that someone was trying to kill him and the patient went and got a gun.

## 2011-09-03 NOTE — ED Notes (Signed)
Patient reported to be hallucinating for a while.  Worse today.  He had a gun due to feeling like someone was coming for him.  Patient with increased anxiety after having surgery.  Patient has had decreased urine output as well.  Patient states he was hearing things.

## 2011-09-03 NOTE — ED Provider Notes (Signed)
History     CSN: [id number] Arrival date & time: 09/03/2011 12:15 PM   First MD Initiated Contact with Patient 09/03/11 1236      Chief Complaint  Patient presents with  . Hallucinations    (Consider location/radiation/quality/duration/timing/severity/associated sxs/prior treatment) HPI   Patient presents to emergency department complaining of auditory hallucinations and back pain. Patient was hospitalize recently for anterior lateral interbody fusion L3-4 via lateral retroperitoneal approach, Posterior segmental instrumentation L3 to existing posterior pedicle screw rod construct and Posterior lateral arthrodesis L3-4. Venita Lick, MD on December 13 stating that he was discharged from hospital with Percocet and Flexeril but since returning home has felt overly anxious and "fidgety" per his wife. Wife states that since returning home he has had intermittent auditory hallucinations stating that will wake at night complaining that he hears people outside, believes people are breaking into the house, or that people are calling out his name. Patient states the hallucinations seem very real, so much so that he got a gun out last night to check the house because he believed somebody was trying to break in due to hearing sounds outside. However once wife awakens and speaks with husband she is able to easily convince him that hallucinations are not real and she states she feels in no danger in the home with her husband. Patient states he had similar auditory hallucinations with pain medication use in the past that he believes to have been Vicodin. Patient states he stopped taking the Percocet with his last dose at 6 AM this morning. Patient states he is having "bad back pain" because "I don't have pain meds". Patient denies any homicidal or suicidal ideation, visual hallucinations, headache, dizziness, chest pain, shortness of breath, abdominal pain, extremity numbness/tingling/weakness, saddle seat  paresthesias. Patient notes  that he feels constipated stating that he has not had a bowel movement in 2 days and also complains that he's had smaller amounts of urination stating that he has urinary hesitancy and difficulty forming a stream with decreased urinary output. Patient denies any fevers or chills or drainage from his surgical site.  Past Medical History  Diagnosis Date  . Coronary artery disease   . Hypercholesteremia     no medication at this time  . Chronic kidney disease   . Ulcer     on medication  . Headache   . Arthritis   . Complication of anesthesia     had difficulty with breathing, had difficulty waking up  . Trigger finger     on both hands    Past Surgical History  Procedure Date  . Knee surgery     left  . Cervical disc surgery     3 ruptured disc, 2012  . Lumbar fusion     2008  . Anterior lat lumbar fusion 08/28/2011    Procedure: ANTERIOR LATERAL LUMBAR FUSION 1 LEVEL;  Surgeon: Alvy Beal;  Location: [facility] OR;  Service: Orthopedics;  Laterality: N/A;  Lateral L3-4 Fusion Posterior Spinal Fusion L3-4, Possible Removal of Hardware     No family history on file.  History  Substance Use Topics  . Smoking status: Former Smoker    Types: Cigarettes  . Smokeless tobacco: Not on file   Comment: stopped at age 59 years  . Alcohol Use: No     occassional      Review of Systems  All other systems reviewed and are negative.    Allergies  Vicodin and Milk-related compounds  Home Medications  Current Outpatient Rx  Name Route Sig Dispense Refill  . ASPIRIN 81 MG PO TABS Oral Take 81 mg by mouth daily.      . CYCLOBENZAPRINE HCL 10 MG PO TABS Oral Take 1 tablet (10 mg total) by mouth 3 (three) times daily as needed for muscle spasms. 90 tablet 0  . METOPROLOL TARTRATE 25 MG PO TABS Oral Take 25 mg by mouth 2 (two) times daily.     Marland Kitchen OMEPRAZOLE 20 MG PO CPDR Oral Take 20 mg by mouth 2 (two) times daily.     . OXYCODONE HCL 5 MG PO TABS Oral  Take 2 tablets (10 mg total) by mouth every 4 (four) hours as needed for pain. 120 tablet 0  . POLYETHYLENE GLYCOL 3350 PO PACK Oral Take 17 g by mouth daily.      Marland Kitchen ONDANSETRON HCL 4 MG PO TABS Oral Take 1 tablet (4 mg total) by mouth every 8 (eight) hours as needed for nausea. 20 tablet 0  . POLYETHYLENE GLYCOL 3350 PO PACK Oral Take 17 g by mouth daily. 14 each 0    BP 125/76  Pulse 108  Temp(Src) 98.2 F (36.8 C) (Oral)  Resp 16  SpO2 99%  Physical Exam  Nursing note and vitals reviewed. Constitutional: He is oriented to person, place, and time. He appears well-developed and well-nourished. No distress.  HENT:  Head: Normocephalic and atraumatic.  Eyes: Conjunctivae and EOM are normal. Pupils are equal, round, and reactive to light.  Neck: Normal range of motion. Neck supple.  Cardiovascular: Normal rate, regular rhythm, normal heart sounds and intact distal pulses.  Exam reveals no gallop and no friction rub.   No murmur heard. Pulmonary/Chest: Effort normal and breath sounds normal. No respiratory distress. He has no wheezes. He has no rales. He exhibits no tenderness.  Abdominal: Bowel sounds are normal. He exhibits no distension and no mass. There is no tenderness. There is no rebound and no guarding.  Musculoskeletal: Normal range of motion. He exhibits tenderness. He exhibits no edema.       Mild tenderness to palpation around surgical incisions sites were no signs of infection.  Full range of motion of bilateral upper and lower extremities with normal reflexes and sensation. 5 out of 5 strength of extremities.  Neurological: He is alert and oriented to person, place, and time. He has normal reflexes.  Skin: Skin is warm and dry. No rash noted. He is not diaphoretic. No erythema.       Well healing linear surgical incision sites of lower posterior back without drainage, erythema, crepitus, or swelling.  Psychiatric: He has a normal mood and affect. His behavior is normal.  Judgment and thought content normal.       Patient admits to hearing sounds that he acknowledges are not tear but denies any current hallucinations he denies any suicidal or homicidal ideation.    ED Course  Procedures (including critical care time)  Labs Reviewed  CBC - Abnormal; Notable for the following:    RBC 3.91 (*)    Hemoglobin 10.5 (*)    HCT 31.7 (*)    All other components within normal limits  URINALYSIS, ROUTINE W REFLEX MICROSCOPIC - Abnormal; Notable for the following:    Glucose, UA >1000 (*)    Urobilinogen, UA 2.0 (*)    All other components within normal limits  POCT I-STAT, CHEM 8 - Abnormal; Notable for the following:    Sodium 134 (*)    Glucose,  Bld 293 (*)    Hemoglobin 10.9 (*)    HCT 32.0 (*)    All other components within normal limits  POCT I-STAT, CHEM 8 - Abnormal; Notable for the following:    Sodium 134 (*)    Glucose, Bld 292 (*)    Hemoglobin 10.5 (*)    HCT 31.0 (*)    All other components within normal limits  DIFFERENTIAL  ETHANOL  URINE RAPID DRUG SCREEN (HOSP PERFORMED)  URINE MICROSCOPIC-ADD ON  I-STAT, CHEM 8   Dg Lumbar Spine Complete  09/03/2011  *RADIOLOGY REPORT*  Clinical Data: Lower back pain, postop  LUMBAR SPINE - COMPLETE 4+ VIEW  Comparison: 08/28/2011  Findings: Five views of the lumbar spine submitted.  No acute fracture or subluxation.  Again noted posterior fixation material with bilateral pedicle screws at L3, L4, L5 and S1 level. Interbody spacers at L3-L4 L4-L5 and L5 S1 are stable.  IMPRESSION: No acute fracture or subluxation.  Stable postoperative changes at L3 S1 level.  Original Report Authenticated By: Natasha Mead, M.D.     1. Back pain   2. Auditory hallucinations       MDM  Patient has no signs or symptoms of central cord compression or cauda equina with no acute findings on his x-ray or his labs. Question side effect of Percocet of auditory hallucinations however both patient and wife feel patient is  safe at home without any suicidal or homicidal ideation. Patient has had no auditory hallucination while in ER. They're both very agreeable to following up closely with Dr. Shon Baton to further discuss pain management but to return to emergency department for any worsening hallucinations or worsening back pain.        [place], [place] 09/03/11 1547

## 2012-03-15 ENCOUNTER — Emergency Department: Payer: Self-pay | Admitting: Emergency Medicine

## 2012-03-16 LAB — BASIC METABOLIC PANEL
Anion Gap: 11 (ref 7–16)
BUN: 19 mg/dL — ABNORMAL HIGH (ref 7–18)
Co2: 23 mmol/L (ref 21–32)
Creatinine: 1.2 mg/dL (ref 0.60–1.30)
EGFR (African American): >60
EGFR (Non-African Amer.): >60
Sodium: 140 mmol/L (ref 136–145)

## 2012-03-16 LAB — CBC
HCT: 35 % — ABNORMAL LOW (ref 40.0–52.0)
MCV: 79 fL — ABNORMAL LOW (ref 80–100)
Platelet: 338 10*3/uL (ref 150–440)
RBC: 4.44 10*6/uL (ref 4.40–5.90)

## 2012-12-11 ENCOUNTER — Ambulatory Visit: Payer: Self-pay | Admitting: Family Medicine

## 2013-11-07 ENCOUNTER — Emergency Department: Payer: Self-pay | Admitting: Emergency Medicine

## 2014-11-09 DIAGNOSIS — J042 Acute laryngotracheitis: Secondary | ICD-10-CM | POA: Diagnosis not present

## 2015-04-23 DIAGNOSIS — J042 Acute laryngotracheitis: Secondary | ICD-10-CM | POA: Diagnosis not present

## 2015-05-16 DIAGNOSIS — Z23 Encounter for immunization: Secondary | ICD-10-CM | POA: Diagnosis not present

## 2015-08-15 DIAGNOSIS — R1013 Epigastric pain: Secondary | ICD-10-CM | POA: Diagnosis not present

## 2015-08-15 DIAGNOSIS — G8929 Other chronic pain: Secondary | ICD-10-CM | POA: Diagnosis not present

## 2015-08-17 DIAGNOSIS — R739 Hyperglycemia, unspecified: Secondary | ICD-10-CM | POA: Diagnosis not present

## 2015-08-23 DIAGNOSIS — R739 Hyperglycemia, unspecified: Secondary | ICD-10-CM | POA: Diagnosis not present

## 2015-09-04 DIAGNOSIS — R1013 Epigastric pain: Secondary | ICD-10-CM | POA: Diagnosis not present

## 2015-09-16 HISTORY — PX: COLONOSCOPY: SHX174

## 2015-11-22 ENCOUNTER — Encounter: Payer: Self-pay | Admitting: *Deleted

## 2015-11-23 ENCOUNTER — Encounter
Admission: RE | Disposition: A | Discharge status: Home / Self Care | Payer: Self-pay | Source: Ambulatory Visit | Attending: Gastroenterology

## 2015-11-23 ENCOUNTER — Ambulatory Visit: Payer: Medicare Other | Admitting: Anesthesiology

## 2015-11-23 ENCOUNTER — Ambulatory Visit
Admission: RE | Admit: 2015-11-23 | Discharge: 2015-11-23 | Disposition: A | Discharge status: Home / Self Care | Payer: Medicare Other | Source: Ambulatory Visit | Attending: Gastroenterology | Admitting: Gastroenterology

## 2015-11-23 ENCOUNTER — Encounter: Payer: Self-pay | Admitting: *Deleted

## 2015-11-23 DIAGNOSIS — Z7982 Long term (current) use of aspirin: Secondary | ICD-10-CM | POA: Diagnosis not present

## 2015-11-23 DIAGNOSIS — Z885 Allergy status to narcotic agent status: Secondary | ICD-10-CM | POA: Diagnosis not present

## 2015-11-23 DIAGNOSIS — G8929 Other chronic pain: Secondary | ICD-10-CM | POA: Diagnosis not present

## 2015-11-23 DIAGNOSIS — K449 Diaphragmatic hernia without obstruction or gangrene: Secondary | ICD-10-CM | POA: Insufficient documentation

## 2015-11-23 DIAGNOSIS — K2900 Acute gastritis without bleeding: Secondary | ICD-10-CM | POA: Diagnosis not present

## 2015-11-23 DIAGNOSIS — F1010 Alcohol abuse, uncomplicated: Secondary | ICD-10-CM | POA: Diagnosis not present

## 2015-11-23 DIAGNOSIS — K2960 Other gastritis without bleeding: Secondary | ICD-10-CM | POA: Diagnosis not present

## 2015-11-23 DIAGNOSIS — I2510 Atherosclerotic heart disease of native coronary artery without angina pectoris: Secondary | ICD-10-CM | POA: Diagnosis not present

## 2015-11-23 DIAGNOSIS — Z91011 Allergy to milk products: Secondary | ICD-10-CM | POA: Insufficient documentation

## 2015-11-23 DIAGNOSIS — E7800 Pure hypercholesterolemia, unspecified: Secondary | ICD-10-CM | POA: Insufficient documentation

## 2015-11-23 DIAGNOSIS — N189 Chronic kidney disease, unspecified: Secondary | ICD-10-CM | POA: Insufficient documentation

## 2015-11-23 DIAGNOSIS — R1013 Epigastric pain: Secondary | ICD-10-CM | POA: Diagnosis not present

## 2015-11-23 DIAGNOSIS — M1990 Unspecified osteoarthritis, unspecified site: Secondary | ICD-10-CM | POA: Insufficient documentation

## 2015-11-23 DIAGNOSIS — Z79899 Other long term (current) drug therapy: Secondary | ICD-10-CM | POA: Insufficient documentation

## 2015-11-23 DIAGNOSIS — I129 Hypertensive chronic kidney disease with stage 1 through stage 4 chronic kidney disease, or unspecified chronic kidney disease: Secondary | ICD-10-CM | POA: Diagnosis not present

## 2015-11-23 DIAGNOSIS — K210 Gastro-esophageal reflux disease with esophagitis: Secondary | ICD-10-CM | POA: Diagnosis not present

## 2015-11-23 DIAGNOSIS — K2950 Unspecified chronic gastritis without bleeding: Secondary | ICD-10-CM | POA: Diagnosis not present

## 2015-11-23 HISTORY — DX: Dorsalgia, unspecified: M54.9

## 2015-11-23 HISTORY — DX: Essential (primary) hypertension: I10

## 2015-11-23 HISTORY — PX: ESOPHAGOGASTRODUODENOSCOPY (EGD) WITH PROPOFOL: SHX5813

## 2015-11-23 HISTORY — DX: Other chronic pain: G89.29

## 2015-11-23 HISTORY — DX: Alcohol abuse, uncomplicated: F10.10

## 2015-11-23 HISTORY — DX: Gonococcal infection, unspecified: A54.9

## 2015-11-23 SURGERY — ESOPHAGOGASTRODUODENOSCOPY (EGD) WITH PROPOFOL
Anesthesia: General

## 2015-11-23 MED ORDER — PROPOFOL 10 MG/ML IV BOLUS
INTRAVENOUS | Status: DC | PRN
  Administered 2015-11-23: 30 mg via INTRAVENOUS
  Administered 2015-11-23: 20 mg via INTRAVENOUS

## 2015-11-23 MED ORDER — MIDAZOLAM HCL 2 MG/2ML IJ SOLN
INTRAMUSCULAR | Status: DC | PRN
  Administered 2015-11-23: 1 mg via INTRAVENOUS

## 2015-11-23 MED ORDER — FENTANYL CITRATE (PF) 100 MCG/2ML IJ SOLN
INTRAMUSCULAR | Status: DC | PRN
  Administered 2015-11-23: 50 ug via INTRAVENOUS

## 2015-11-23 MED ORDER — IPRATROPIUM-ALBUTEROL 0.5-2.5 (3) MG/3ML IN SOLN
3.0000 mL | Freq: Once | RESPIRATORY_TRACT | Status: AC
  Administered 2015-11-23: 3 mL via RESPIRATORY_TRACT
  Filled 2015-11-23: qty 3

## 2015-11-23 MED ORDER — SODIUM CHLORIDE 0.9 % IV SOLN: INTRAVENOUS | Status: DC

## 2015-11-23 MED ORDER — PROPOFOL 500 MG/50ML IV EMUL
INTRAVENOUS | Status: DC | PRN
  Administered 2015-11-23: 100 ug/kg/min via INTRAVENOUS

## 2015-11-23 MED ORDER — SODIUM CHLORIDE 0.9 % IV SOLN
INTRAVENOUS | Status: DC
  Administered 2015-11-23: 12:00:00 via INTRAVENOUS

## 2015-11-23 NOTE — Transfer of Care (Signed)
Immediate Anesthesia Transfer of Care Note  Patient: Tyler KindredFreddie L Russell  Procedure(s) Performed: Procedure(s): ESOPHAGOGASTRODUODENOSCOPY (EGD) WITH PROPOFOL (N/A)  Patient Location: PACU  Anesthesia Type:General  Level of Consciousness: awake, alert  and oriented  Airway & Oxygen Therapy: Patient Spontanous Breathing  Post-op Assessment: Report given to RN and Post -op Vital signs reviewed and stable  Post vital signs: Reviewed and stable  Last Vitals:  Filed Vitals:   11/23/15 1150  BP: 147/87  Pulse: 73  Temp: 36.7 C  Resp: 15    Complications: No apparent anesthesia complications

## 2015-11-23 NOTE — H&P (Signed)
Outpatient short stay form Pre-procedure 11/23/2015 12:00 PM Tyler DeemMartin U Skulskie MD  Primary Physician: Dr. Yates DecampJohn Walker  Reason for visit:  EGD  History of present illness:  Patient is a 60 year old male presenting today for EGD in regards to symptoms of epigastric pain and dyspepsia. He has been taking a 40 mg omeprazole that has been of benefit. He does have a history of peptic ulcer disease in the past. He was taking Advil Aleve and aspirin for headaches up to about a month ago. Currently only taking Tylenol. He takes no other fashion products or blood thinning agents.    Current facility-administered medications:  .  0.9 %  sodium chloride infusion, , Intravenous, Continuous, Tyler DeemMartin U Skulskie, MD .  0.9 %  sodium chloride infusion, , Intravenous, Continuous, Tyler DeemMartin U Skulskie, MD .  ipratropium-albuterol (DUONEB) 0.5-2.5 (3) MG/3ML nebulizer solution 3 mL, 3 mL, Nebulization, Once, Rosaria FerriesJoseph K Piscitello, MD  Prescriptions prior to admission  Medication Sig Dispense Refill Last Dose  . gemfibrozil (LOPID) 600 MG tablet Take 600 mg by mouth 2 (two) times daily before a meal.   11/22/2015 at Unknown time  . metoprolol tartrate (LOPRESSOR) 25 MG tablet Take 25 mg by mouth 2 (two) times daily.    11/23/2015 at 0700  . omeprazole (PRILOSEC) 20 MG capsule Take 20 mg by mouth 2 (two) times daily.    11/22/2015 at Unknown time  . aspirin 81 MG tablet Take 81 mg by mouth daily.     09/04/2015  . polyethylene glycol (MIRALAX / GLYCOLAX) packet Take 17 g by mouth daily.     09/03/2011 at Unknown     Allergies  Allergen Reactions  . Vicodin [Hydrocodone-Acetaminophen] Itching  . Milk-Related Compounds Other (See Comments)    Sneezing and post nasal drip     Past Medical History  Diagnosis Date  . Coronary artery disease   . Hypercholesteremia     no medication at this time  . Chronic kidney disease   . Ulcer     on medication  . Headache(784.0)   . Arthritis   . Complication of anesthesia      had difficulty with breathing, had difficulty waking up  . Trigger finger     on both hands  . Chronic back pain   . Alcohol abuse   . Gonorrhea   . Hypertension     Review of systems:      Physical Exam    Heart and lungs: Regular rate and rhythm without rub or gallop, lungs are bilaterally clear.    HEENT: Normocephalic atraumatic eyes are anicteric    Other:     Pertinant exam for procedure: Soft nontender nondistended bowel sounds positive normoactive    Planned proceedures: EGD and indicated procedures. I have discussed the risks benefits and complications of procedures to include not limited to bleeding, infection, perforation and the risk of sedation and the patient wishes to proceed.    Tyler DeemMartin U Skulskie, MD Gastroenterology 11/23/2015  12:00 PM

## 2015-11-23 NOTE — Op Note (Signed)
[facility] Gastroenterology Patient Name: Tyler Russell Procedure Date: 11/23/2015 12:09 PM MRN: [id number] Account #: [id number] Date of Birth: 12-12-1955 Admit Type: Outpatient Age: 60 Room: [facility] ENDO ROOM 3 Gender: Male Note Status: Finalized Procedure:            Upper GI endoscopy Indications:          Epigastric abdominal pain, Dyspepsia Providers:            Christena Deem, MD Referring MD:         Letta Pate. Danne Harbor, MD (Referring MD) Medicines:            Monitored Anesthesia Care Complications:        No immediate complications. Procedure:            Pre-Anesthesia Assessment:                       - ASA Grade Assessment: II - A patient with mild                        systemic disease.                       After obtaining informed consent, the endoscope was                        passed under direct vision. Throughout the procedure,                        the patient's blood pressure, pulse, and oxygen                        saturations were monitored continuously. The Endoscope                        was introduced through the mouth, and advanced to the                        third part of duodenum. The upper GI endoscopy was                        accomplished without difficulty. The patient tolerated                        the procedure well. Findings:      A small hiatal hernia was present.      LA Grade A (one or more mucosal breaks less than 5 mm, not extending       between tops of 2 mucosal folds) esophagitis with no bleeding was found.       Biopsies were taken with a cold forceps for histology.      Diffuse mild inflammation characterized by congestion (edema), erythema       and granularity was found in the gastric body. Biopsies were taken with       a cold forceps for histology. Biopsies were taken with a cold forceps       for Helicobacter pylori testing.      Patchy moderate inflammation characterized by congestion (edema),   erosions, erythema and linear erosions was found in the gastric antrum.       Biopsies were taken with a cold forceps for histology. Biopsies were  taken with a cold forceps for Helicobacter pylori testing.      The cardia and gastric fundus were normal on retroflexion otherwise.      The examined duodenum was normal.      A small sessile polyp was found on the right vocal cord. The polyp is       non-obstructing the airway. Impression:           - Small hiatal hernia.                       - LA Grade A erosive esophagitis. Biopsied.                       - Gastritis. Biopsied.                       - Erosive gastritis. Biopsied.                       - Normal examined duodenum. Recommendation:       - Continue present medications.                       - Await pathology results.                       - Return to my office in 4 weeks. Procedure Code(s):    --- Professional ---                       [phone], Esophagogastroduodenoscopy, flexible, transoral;                        with biopsy, single or multiple Diagnosis Code(s):    --- Professional ---                       K44.9, Diaphragmatic hernia without obstruction or                        gangrene                       K20.8, Other esophagitis                       K29.70, Gastritis, unspecified, without bleeding                       K29.60, Other gastritis without bleeding                       R10.13, Epigastric pain CPT copyright 2016 American Medical Association. All rights reserved. The codes documented in this report are preliminary and upon coder review may  be revised to meet current compliance requirements. Christena DeemMartin U Caydyn Sprung, MD 11/23/2015 12:47:22 PM This report has been signed electronically. Number of Addenda: 0 Note Initiated On: 11/23/2015 12:09 PM      [facility]

## 2015-11-23 NOTE — Anesthesia Preprocedure Evaluation (Signed)
Anesthesia Evaluation  Patient identified by MRN, date of birth, ID band Patient awake    Reviewed: Allergy & Precautions, H&P , NPO status , Patient's Chart, lab work & pertinent test results  History of Anesthesia Complications Negative for: history of anesthetic complications  Airway Mallampati: III  TM Distance: >3 FB Neck ROM: limited    Dental  (+) Poor Dentition, Chipped   Pulmonary neg shortness of breath, former smoker,    Pulmonary exam normal breath sounds clear to auscultation       Cardiovascular Exercise Tolerance: Good hypertension, (-) angina+ CAD  (-) Past MI and (-) DOE Normal cardiovascular exam Rhythm:regular Rate:Normal     Neuro/Psych  Headaches, negative psych ROS   GI/Hepatic negative GI ROS, Neg liver ROS,   Endo/Other  negative endocrine ROS  Renal/GU Renal disease  negative genitourinary   Musculoskeletal  (+) Arthritis ,   Abdominal   Peds  Hematology negative hematology ROS (+)   Anesthesia Other Findings Past Medical History:   Coronary artery disease                                      Hypercholesteremia                                             Comment:no medication at this time   Chronic kidney disease                                       Ulcer                                                          Comment:on medication   Headache(784.0)                                              Arthritis                                                    Trigger finger                                                 Comment:on both hands   Chronic back pain                                            Alcohol abuse  Gonorrhea                                                    Hypertension                                                Past Surgical History:   KNEE SURGERY                                                    Comment:left   CERVICAL DISC SURGERY                                           Comment:3 ruptured disc, 2012   LUMBAR FUSION                                                   Comment:2008   ANTERIOR LAT LUMBAR FUSION                       08/28/2011     Comment:Procedure: ANTERIOR LATERAL LUMBAR FUSION 1               LEVEL;  Surgeon: Alvy Bealahari D Brooks;  Location: [facility]              OR;  Service: Orthopedics;  Laterality: N/A;                Lateral L3-4 Fusion Posterior Spinal Fusion               L3-4, Possible Removal of Hardware    VASECTOMY                                        1983        BMI    Body Mass Index   27.95 kg/m 2      Reproductive/Obstetrics negative OB ROS                             Anesthesia Physical Anesthesia Plan  ASA: III  Anesthesia Plan: General   Post-op Pain Management:    Induction:   Airway Management Planned:   Additional Equipment:   Intra-op Plan:   Post-operative Plan:   Informed Consent: I have reviewed the patients History and Physical, chart, labs and discussed the procedure including the risks, benefits and alternatives for the proposed anesthesia with the patient or authorized representative who has indicated his/her understanding and acceptance.   Dental Advisory Given  Plan Discussed with: Anesthesiologist, CRNA and Surgeon  Anesthesia Plan Comments:         Anesthesia Quick Evaluation

## 2015-11-23 NOTE — Anesthesia Postprocedure Evaluation (Signed)
Anesthesia Post Note  Patient: Tora KindredFreddie L Furnari  Procedure(s) Performed: Procedure(s) (LRB): ESOPHAGOGASTRODUODENOSCOPY (EGD) WITH PROPOFOL (N/A)  Patient location during evaluation: Endoscopy Anesthesia Type: General Level of consciousness: awake and alert Pain management: pain level controlled Vital Signs Assessment: post-procedure vital signs reviewed and stable Respiratory status: spontaneous breathing, nonlabored ventilation, respiratory function stable and patient connected to nasal cannula oxygen Cardiovascular status: blood pressure returned to baseline and stable Postop Assessment: no signs of nausea or vomiting Anesthetic complications: no    Last Vitals:  Filed Vitals:   11/23/15 1308 11/23/15 1318  BP: 129/77 125/78  Pulse: 82 80  Temp:    Resp: 16 16    Last Pain: There were no vitals filed for this visit.               Cleda MccreedyJoseph K Piscitello

## 2015-11-24 ENCOUNTER — Encounter: Payer: Self-pay | Admitting: Gastroenterology

## 2015-11-26 LAB — SURGICAL PATHOLOGY

## 2016-02-26 ENCOUNTER — Inpatient Hospital Stay
Admit: 2016-02-26 | Discharge: 2016-02-26 | Disposition: A | Discharge status: Home / Self Care | Payer: Medicare Other | Attending: Internal Medicine | Admitting: Internal Medicine

## 2016-02-26 ENCOUNTER — Encounter: Payer: Self-pay | Admitting: Emergency Medicine

## 2016-02-26 ENCOUNTER — Emergency Department: Payer: Medicare Other

## 2016-02-26 ENCOUNTER — Inpatient Hospital Stay
Admission: EM | Admit: 2016-02-26 | Discharge: 2016-02-27 | DRG: 282 | Disposition: A | Discharge status: Other Acute Inpatient Hospital | Payer: Medicare Other | Attending: Internal Medicine | Admitting: Internal Medicine

## 2016-02-26 DIAGNOSIS — R778 Other specified abnormalities of plasma proteins: Secondary | ICD-10-CM

## 2016-02-26 DIAGNOSIS — Z981 Arthrodesis status: Secondary | ICD-10-CM

## 2016-02-26 DIAGNOSIS — I214 Non-ST elevation (NSTEMI) myocardial infarction: Secondary | ICD-10-CM | POA: Diagnosis present

## 2016-02-26 DIAGNOSIS — I129 Hypertensive chronic kidney disease with stage 1 through stage 4 chronic kidney disease, or unspecified chronic kidney disease: Secondary | ICD-10-CM | POA: Diagnosis present

## 2016-02-26 DIAGNOSIS — I10 Essential (primary) hypertension: Secondary | ICD-10-CM | POA: Diagnosis not present

## 2016-02-26 DIAGNOSIS — Z8711 Personal history of peptic ulcer disease: Secondary | ICD-10-CM | POA: Diagnosis not present

## 2016-02-26 DIAGNOSIS — M1990 Unspecified osteoarthritis, unspecified site: Secondary | ICD-10-CM | POA: Diagnosis present

## 2016-02-26 DIAGNOSIS — Z79899 Other long term (current) drug therapy: Secondary | ICD-10-CM

## 2016-02-26 DIAGNOSIS — Z87891 Personal history of nicotine dependence: Secondary | ICD-10-CM | POA: Diagnosis not present

## 2016-02-26 DIAGNOSIS — E782 Mixed hyperlipidemia: Secondary | ICD-10-CM | POA: Diagnosis present

## 2016-02-26 DIAGNOSIS — K219 Gastro-esophageal reflux disease without esophagitis: Secondary | ICD-10-CM | POA: Diagnosis not present

## 2016-02-26 DIAGNOSIS — R7989 Other specified abnormal findings of blood chemistry: Secondary | ICD-10-CM

## 2016-02-26 DIAGNOSIS — G8929 Other chronic pain: Secondary | ICD-10-CM | POA: Diagnosis present

## 2016-02-26 DIAGNOSIS — I25110 Atherosclerotic heart disease of native coronary artery with unstable angina pectoris: Secondary | ICD-10-CM | POA: Diagnosis present

## 2016-02-26 DIAGNOSIS — Z8249 Family history of ischemic heart disease and other diseases of the circulatory system: Secondary | ICD-10-CM | POA: Diagnosis not present

## 2016-02-26 DIAGNOSIS — M545 Low back pain: Secondary | ICD-10-CM | POA: Diagnosis present

## 2016-02-26 DIAGNOSIS — K2100 Gastro-esophageal reflux disease with esophagitis, without bleeding: Secondary | ICD-10-CM

## 2016-02-26 DIAGNOSIS — E785 Hyperlipidemia, unspecified: Secondary | ICD-10-CM | POA: Diagnosis not present

## 2016-02-26 DIAGNOSIS — N189 Chronic kidney disease, unspecified: Secondary | ICD-10-CM | POA: Diagnosis present

## 2016-02-26 DIAGNOSIS — K449 Diaphragmatic hernia without obstruction or gangrene: Secondary | ICD-10-CM | POA: Diagnosis present

## 2016-02-26 DIAGNOSIS — R079 Chest pain, unspecified: Secondary | ICD-10-CM | POA: Diagnosis not present

## 2016-02-26 LAB — CBC
HCT: 39 % — ABNORMAL LOW (ref 40.0–52.0)
HCT: 37.8 % — ABNORMAL LOW (ref 40.0–52.0)
HEMOGLOBIN: 13.4 g/dL (ref 13.0–18.0)
Hemoglobin: 12.6 g/dL — ABNORMAL LOW (ref 13.0–18.0)
MCH: 27.2 pg (ref 26.0–34.0)
MCH: 26.7 pg (ref 26.0–34.0)
MCHC: 34.3 g/dL (ref 32.0–36.0)
MCHC: 33.4 g/dL (ref 32.0–36.0)
MCV: 79.3 fL — ABNORMAL LOW (ref 80.0–100.0)
MCV: 79.9 fL — AB (ref 80.0–100.0)
PLATELETS: 320 10*3/uL (ref 150–440)
PLATELETS: 302 10*3/uL (ref 150–440)
RBC: 4.92 MIL/uL (ref 4.40–5.90)
RBC: 4.73 MIL/uL (ref 4.40–5.90)
RDW: 14.4 % (ref 11.5–14.5)
RDW: 14.4 % (ref 11.5–14.5)
WBC: 7.5 10*3/uL (ref 3.8–10.6)
WBC: 6.2 10*3/uL (ref 3.8–10.6)

## 2016-02-26 LAB — LIPASE, BLOOD: LIPASE: 29 U/L (ref 11–51)

## 2016-02-26 LAB — ECHOCARDIOGRAM COMPLETE
CHL CUP MV DEC (S): 190
E/e' ratio: 12.89
EWDT: 190 ms
FS: 37 % (ref 28–44)
Height: 65 in
IV/PV OW: 1.05
LADIAMINDEX: 2.17 cm/m2
LASIZE: 40 mm
LAVOL: 49 mL
LAVOLA4C: 46.4 mL
LAVOLIN: 26.6 mL/m2
LDCA: 3.14 cm2
LEFT ATRIUM END SYS DIAM: 40 mm
LV E/e'average: 12.89
LV TDI E'MEDIAL: 6.31
LVEEMED: 12.89
LVELAT: 6.96 cm/s
LVOT SV: 67 mL
LVOT VTI: 21.3 cm
LVOT peak vel: 109 cm/s
LVOTD: 20 mm
MV Peak grad: 3 mmHg
MV pk E vel: 89.7 m/s
MVPKAVEL: 81.5 m/s
PW: 13.2 mm — AB (ref 0.6–1.1)
TAPSE: 24.3 mm
TDI e' lateral: 6.96
WEIGHTICAEL: 2715.2 [oz_av]

## 2016-02-26 LAB — BASIC METABOLIC PANEL
ANION GAP: 7 (ref 5–15)
BUN: 13 mg/dL (ref 6–20)
CALCIUM: 9.4 mg/dL (ref 8.9–10.3)
CO2: 25 mmol/L (ref 22–32)
CREATININE: 0.81 mg/dL (ref 0.61–1.24)
Chloride: 104 mmol/L (ref 101–111)
Glucose, Bld: 230 mg/dL — ABNORMAL HIGH (ref 65–99)
Potassium: 3.7 mmol/L (ref 3.5–5.1)
SODIUM: 136 mmol/L (ref 135–145)

## 2016-02-26 LAB — HEPATIC FUNCTION PANEL
ALBUMIN: 4.5 g/dL (ref 3.5–5.0)
ALT: 57 U/L (ref 17–63)
AST: 33 U/L (ref 15–41)
Alkaline Phosphatase: 74 U/L (ref 38–126)
BILIRUBIN TOTAL: 0.6 mg/dL (ref 0.3–1.2)
Total Protein: 7.8 g/dL (ref 6.5–8.1)

## 2016-02-26 LAB — LIPID PANEL
Cholesterol: 228 mg/dL — ABNORMAL HIGH (ref 0–200)
HDL: 41 mg/dL (ref >40)
LDL CALC: 158 mg/dL — AB (ref 0–99)
TRIGLYCERIDES: 147 mg/dL (ref <150)
Total CHOL/HDL Ratio: 5.6 RATIO
VLDL: 29 mg/dL (ref 0–40)

## 2016-02-26 LAB — GLUCOSE, CAPILLARY
Glucose-Capillary: 275 mg/dL — ABNORMAL HIGH (ref 65–99)
Glucose-Capillary: 152 mg/dL — ABNORMAL HIGH (ref 65–99)

## 2016-02-26 LAB — ETHANOL: Alcohol, Ethyl (B): <5 mg/dL (ref <5)

## 2016-02-26 LAB — TROPONIN I
TROPONIN I: 0.08 ng/mL — AB (ref <0.031)
Troponin I: 0.21 ng/mL — ABNORMAL HIGH (ref <0.031)

## 2016-02-26 LAB — HEMOGLOBIN A1C: HEMOGLOBIN A1C: 8.6 % — AB (ref 4.0–6.0)

## 2016-02-26 MED ORDER — GEMFIBROZIL 600 MG PO TABS
600.0000 mg | ORAL_TABLET | Freq: Two times a day (BID) | ORAL | Status: DC
  Administered 2016-02-26: 600 mg via ORAL
  Filled 2016-02-26: qty 1

## 2016-02-26 MED ORDER — ASPIRIN EC 81 MG PO TBEC: 81.0000 mg | DELAYED_RELEASE_TABLET | Freq: Every day | ORAL | Status: DC

## 2016-02-26 MED ORDER — SODIUM CHLORIDE 0.9 % IV SOLN: 250.0000 mL | INTRAVENOUS | Status: DC | PRN

## 2016-02-26 MED ORDER — ACETAMINOPHEN 650 MG RE SUPP: 650.0000 mg | Freq: Four times a day (QID) | RECTAL | Status: DC | PRN

## 2016-02-26 MED ORDER — GI COCKTAIL ~~LOC~~
30.0000 mL | Freq: Once | ORAL | Status: AC
  Administered 2016-02-26: 30 mL via ORAL
  Filled 2016-02-26: qty 30

## 2016-02-26 MED ORDER — SODIUM CHLORIDE 0.9 % WEIGHT BASED INFUSION
3.0000 mL/kg/h | INTRAVENOUS | Status: AC
  Administered 2016-02-27: 3 mL/kg/h via INTRAVENOUS

## 2016-02-26 MED ORDER — SODIUM CHLORIDE 0.9 % WEIGHT BASED INFUSION: 1.0000 mL/kg/h | INTRAVENOUS | Status: DC

## 2016-02-26 MED ORDER — ONDANSETRON HCL 4 MG/2ML IJ SOLN: 4.0000 mg | Freq: Four times a day (QID) | INTRAMUSCULAR | Status: DC | PRN

## 2016-02-26 MED ORDER — INSULIN ASPART 100 UNIT/ML ~~LOC~~ SOLN: 0.0000 [IU] | Freq: Every day | SUBCUTANEOUS | Status: DC

## 2016-02-26 MED ORDER — MORPHINE SULFATE (PF) 2 MG/ML IV SOLN
2.0000 mg | INTRAVENOUS | Status: DC | PRN
Start: 2016-02-26 — End: 2016-02-27

## 2016-02-26 MED ORDER — ONDANSETRON HCL 4 MG PO TABS: 4.0000 mg | ORAL_TABLET | Freq: Four times a day (QID) | ORAL | Status: DC | PRN

## 2016-02-26 MED ORDER — LORAZEPAM 1 MG PO TABS
1.0000 mg | ORAL_TABLET | Freq: Every day | ORAL | Status: DC
  Filled 2016-02-26: qty 1

## 2016-02-26 MED ORDER — SODIUM CHLORIDE 0.9% FLUSH: 3.0000 mL | Freq: Two times a day (BID) | INTRAVENOUS | Status: DC

## 2016-02-26 MED ORDER — ENOXAPARIN SODIUM 80 MG/0.8ML ~~LOC~~ SOLN
1.0000 mg/kg | Freq: Two times a day (BID) | SUBCUTANEOUS | Status: DC
  Administered 2016-02-26: 75 mg via SUBCUTANEOUS
  Filled 2016-02-26: qty 0.8

## 2016-02-26 MED ORDER — METOPROLOL TARTRATE 25 MG PO TABS
25.0000 mg | ORAL_TABLET | Freq: Two times a day (BID) | ORAL | Status: DC
  Administered 2016-02-26: 25 mg via ORAL
  Filled 2016-02-26: qty 1

## 2016-02-26 MED ORDER — INSULIN ASPART 100 UNIT/ML ~~LOC~~ SOLN
0.0000 [IU] | Freq: Three times a day (TID) | SUBCUTANEOUS | Status: DC
  Administered 2016-02-26: 5 [IU] via SUBCUTANEOUS
  Filled 2016-02-26: qty 5

## 2016-02-26 MED ORDER — TRAMADOL HCL 50 MG PO TABS: 50.0000 mg | ORAL_TABLET | Freq: Four times a day (QID) | ORAL | Status: DC | PRN

## 2016-02-26 MED ORDER — NITROGLYCERIN 0.4 MG SL SUBL: 0.4000 mg | SUBLINGUAL_TABLET | SUBLINGUAL | Status: DC | PRN

## 2016-02-26 MED ORDER — AMLODIPINE BESYLATE 5 MG PO TABS
5.0000 mg | ORAL_TABLET | Freq: Every day | ORAL | Status: DC
  Administered 2016-02-26: 5 mg via ORAL
  Filled 2016-02-26: qty 1

## 2016-02-26 MED ORDER — PANTOPRAZOLE SODIUM 40 MG PO TBEC
40.0000 mg | DELAYED_RELEASE_TABLET | Freq: Two times a day (BID) | ORAL | Status: DC
  Administered 2016-02-26: 40 mg via ORAL
  Filled 2016-02-26: qty 1

## 2016-02-26 MED ORDER — SODIUM CHLORIDE 0.9% FLUSH: 3.0000 mL | INTRAVENOUS | Status: DC | PRN

## 2016-02-26 MED ORDER — ASPIRIN 81 MG PO CHEW: 81.0000 mg | CHEWABLE_TABLET | ORAL | Status: DC

## 2016-02-26 MED ORDER — SODIUM CHLORIDE 0.9 % IV SOLN
INTRAVENOUS | Status: DC
  Administered 2016-02-26: 17:00:00 via INTRAVENOUS

## 2016-02-26 MED ORDER — ACETAMINOPHEN 325 MG PO TABS: 650.0000 mg | ORAL_TABLET | Freq: Four times a day (QID) | ORAL | Status: DC | PRN

## 2016-02-26 MED ORDER — ASPIRIN EC 325 MG PO TBEC
325.0000 mg | DELAYED_RELEASE_TABLET | Freq: Once | ORAL | Status: AC
  Administered 2016-02-26: 325 mg via ORAL
  Filled 2016-02-26: qty 1

## 2016-02-26 NOTE — ED Notes (Addendum)
Pt states he has an ulcer on esophagus and ate too much last night and threw up once. Ate seafood and ice cream. Took "a swallow" of Equip (he thinks, he stated an off brand antacid). States no pain, no nausea right now.   Per wife he has had esophageal ulcer for few years, had a upper GI few months ago, doubled up on Prilosec.   Pt states he has been having problems walking, becomes SOB with walking. Wife states this has been going on a while.

## 2016-02-26 NOTE — ED Provider Notes (Signed)
60 y.o. male presenting with chest pain. The patient has a positive second troponin, so at this time I will order aspirin for him and have him admitted to the hospital for further cardiac evaluation. At this time, the patient's symptoms are significantly improved and he is no longer experiencing chest pain. He is mildly hypertensive but has otherwise stable vital signs.  Rockne MenghiniAnne-Caroline Ajanay Farve, MD 02/26/16 [phone]

## 2016-02-26 NOTE — ED Notes (Signed)
Patient transported to X-ray 

## 2016-02-26 NOTE — Progress Notes (Signed)
ANTICOAGULATION CONSULT NOTE - Initial Consult  Pharmacy Consult for Enoxaparin Indication: chest pain/ACS  Allergies  Allergen Reactions  . Vicodin [Hydrocodone-Acetaminophen] Itching  . Milk-Related Compounds Other (See Comments)    Sneezing and post nasal drip    Patient Measurements: Height: 5\' 5"  (165.1 cm) Weight: 170 lb (77.111 kg) IBW/kg (Calculated) : 61.5 Heparin Dosing Weight:   Vital Signs: Temp: 97.6 F (36.4 C) (06/13 0314) Temp Source: Oral (06/13 0314) BP: 159/93 mmHg (06/13 1330) Pulse Rate: 86 (06/13 1330)  Labs:  Recent Labs  02/26/16 0318 02/26/16 0706  HGB 13.4  --   HCT 39.0*  --   PLT 320  --   CREATININE 0.81  --   TROPONINI <0.03 0.08*    Estimated Creatinine Clearance: 94 mL/min (by C-G formula based on Cr of 0.81).   Medical History: Past Medical History  Diagnosis Date  . Hypercholesteremia     no medication at this time  . Chronic kidney disease   . Ulcer     on medication  . Headache(784.0)   . Arthritis   . Complication of anesthesia     had difficulty with breathing, had difficulty waking up  . Trigger finger     on both hands  . Chronic back pain   . Alcohol abuse   . Gonorrhea   . Hypertension     Medications:   (Not in a hospital admission) Scheduled:  . amLODipine  5 mg Oral Daily  . enoxaparin (LOVENOX) injection  1 mg/kg Subcutaneous Q12H    Assessment: Pharmacy consulted to dose and monitor enoxaparin in this 60 year old male with NSTEMI.  Goal of Therapy:   Monitor platelets by anticoagulation protocol: Yes   Plan:  Will start lovenox 75 mg SQ q12  Hours.    Resean Brander D 02/26/2016,1:59 PM

## 2016-02-26 NOTE — ED Notes (Signed)
Patient presents to ED via [facility] from home, patient reports was dx with ulcer, reports vomited x 1 this morning. Patient c/o of "esophagus burning." Patient has hx of HTN is currently taking medications as prescribed. Patient alert and oriented x 4, no increased work in breathing noted, skin warm and dry.

## 2016-02-26 NOTE — Progress Notes (Signed)
Patient admitted to unit. Oriented to room, call bell, and staff. Bed in lowest position. Fall safety plan reviewed. Full assessment to Epic. Skin assessment verified with Vilinda BlanksBrandi Mansfield RN. Telemetry box verification with tele clerk- Box#: [id number]. Will continue to monitor. Family at bedside. No complaints at this time.

## 2016-02-26 NOTE — ED Notes (Signed)
MD notified of increased troponin 

## 2016-02-26 NOTE — Consult Note (Signed)
[facility] Cardiology Consultation Note  Patient ID: Tyler Russell, MRN: [id number], DOB/AGE: 1956/02/05 60 y.o. Admit date: 02/26/2016   Date of Consult: 02/26/2016 Primary Physician: Rafael Bihari, MD Primary Cardiologist: None  Chief Complaint:  Chief Complaint  Patient presents with  . Chest Pain   Reason for Consult: chest pain with elevated troponin  HPI: 60 y.o. male with known essential hypertension mixed hyperlipidemia and apparent coronary artery disease by a parent cardiac catheterization many years ago showing minor three-vessel coronary artery disease treated with medication management. The patient has been on amlodipine for hypertension control and has not been on a lipid management since. He has had progressive episodes of abdominal discomfort as well as burning in his chest with physical activity relieved by rest most consistent with angina Congo class III although now has occurred at rest. EKG shows normal sinus rhythm and a patient does have an elevated troponin of 0.083 consistent with non-STEMI. At this time he is comfortable  Past Medical History  Diagnosis Date  . Hypercholesteremia     no medication at this time  . Chronic kidney disease   . Ulcer     on medication  . Headache(784.0)   . Arthritis   . Complication of anesthesia     had difficulty with breathing, had difficulty waking up  . Trigger finger     on both hands  . Chronic back pain   . Alcohol abuse   . Gonorrhea   . Hypertension       Surgical History:  Past Surgical History  Procedure Laterality Date  . Knee surgery      left  . Cervical disc surgery      3 ruptured disc, 2012  . Lumbar fusion      2008  . Anterior lat lumbar fusion  08/28/2011    Procedure: ANTERIOR LATERAL LUMBAR FUSION 1 LEVEL;  Surgeon: Alvy Beal;  Location: [facility] OR;  Service: Orthopedics;  Laterality: N/A;  Lateral L3-4 Fusion Posterior Spinal Fusion L3-4, Possible Removal of Hardware   .  Vasectomy  1983  . Esophagogastroduodenoscopy (egd) with propofol N/A 11/23/2015    Procedure: ESOPHAGOGASTRODUODENOSCOPY (EGD) WITH PROPOFOL;  Surgeon: Christena Deem, MD;  Location: [facility] ENDOSCOPY;  Service: Endoscopy;  Laterality: N/A;     Home Meds: Prior to Admission medications   Medication Sig Start Date End Date Taking? Authorizing Provider  gemfibrozil (LOPID) 600 MG tablet Take 600 mg by mouth 2 (two) times daily before a meal.   Yes Historical Provider, MD  metoprolol tartrate (LOPRESSOR) 25 MG tablet Take 25 mg by mouth 2 (two) times daily.    Yes Historical Provider, MD  omeprazole (PRILOSEC) 20 MG capsule Take 20 mg by mouth daily.    Yes Historical Provider, MD    Inpatient Medications:  . amLODipine  5 mg Oral Daily  . aspirin EC  81 mg Oral Daily  . enoxaparin (LOVENOX) injection  1 mg/kg Subcutaneous Q12H  . gemfibrozil  600 mg Oral BID AC  . insulin aspart  0-5 Units Subcutaneous QHS  . insulin aspart  0-9 Units Subcutaneous TID WC  . metoprolol tartrate  25 mg Oral BID  . pantoprazole  40 mg Oral BID AC   . sodium chloride      Allergies:  Allergies  Allergen Reactions  . Vicodin [Hydrocodone-Acetaminophen] Itching  . Milk-Related Compounds Other (See Comments)    Sneezing and post nasal drip    Social History  Social History  . Marital Status: Married    Spouse Name: N/A  . Number of Children: N/A  . Years of Education: N/A   Occupational History  . Not on file.   Social History Main Topics  . Smoking status: Former Smoker    Types: Cigarettes  . Smokeless tobacco: Not on file     Comment: stopped at age 60 years  . Alcohol Use: No     Comment: occassional  . Drug Use: No  . Sexual Activity: Yes   Other Topics Concern  . Not on file   Social History Narrative   Independent at baseline, lives with family     Family History  Problem Relation Age of Onset  . CAD Father   . Stomach cancer Maternal Grandmother      Review of  Systems Positive for Chest discomfort burning in the chest Negative for: General:  chills, fever, night sweats or weight changes.  Cardiovascular: PND orthopnea syncope dizziness  Dermatological skin lesions rashes Respiratory: Cough congestion Urologic: Frequent urination urination at night and hematuria Abdominal: negative for nausea, vomiting, diarrhea, bright red blood per rectum, melena, or hematemesis Neurologic: negative for visual changes, and/or hearing changes  All other systems reviewed and are otherwise negative except as noted above.  Labs:  Recent Labs  02/26/16 0318 02/26/16 0706  TROPONINI <0.03 0.08*   Lab Results  Component Value Date   WBC 6.2 02/26/2016   HGB 12.6* 02/26/2016   HCT 37.8* 02/26/2016   MCV 79.9* 02/26/2016   PLT 302 02/26/2016    Recent Labs Lab 02/26/16 0318  NA 136  K 3.7  CL 104  CO2 25  BUN 13  CREATININE 0.81  CALCIUM 9.4  PROT 7.8  BILITOT 0.6  ALKPHOS 74  ALT 57  AST 33  GLUCOSE 230*   No results found for: CHOL, HDL, LDLCALC, TRIG No results found for: DDIMER  Radiology/Studies:  Dg Chest 2 View  02/26/2016  CLINICAL DATA:  Chest pain. Patient ate a large meal which aggravated his stomach ulcer. EXAM: CHEST  2 VIEW COMPARISON:  01/27/2011 FINDINGS: Emphysematous changes in the lungs. Normal heart size and pulmonary vascularity. No focal airspace disease or consolidation in the lungs. No blunting of costophrenic angles. No pneumothorax. Mediastinal contours appear intact. Postoperative change in the cervical spine. IMPRESSION: Emphysematous changes in the lungs. No evidence of active pulmonary disease. Electronically Signed   By: Burman NievesWilliam  Stevens M.D.   On: 02/26/2016 03:46    EKG: Normal sinus rhythm  Weights: Filed Weights   02/26/16 0314  Weight: 170 lb (77.111 kg)     Physical Exam: Blood pressure 146/73, pulse 81, temperature 98.3 F (36.8 C), temperature source Oral, resp. rate 16, height 5\' 5"  (1.651 m),  weight 170 lb (77.111 kg), SpO2 94 %. Body mass index is 28.29 kg/(m^2). General: Well developed, well nourished, in no acute distress. Head eyes ears nose throat: Normocephalic, atraumatic, sclera non-icteric, no xanthomas, nares are without discharge. No apparent thyromegaly and/or mass  Lungs: Normal respiratory effort.  no wheezes, no rales, no rhonchi.  Heart: RRR with normal S1 S2. no murmur gallop, no rub, PMI is normal size and placement, carotid upstroke normal without bruit, jugular venous pressure is normal Abdomen: Soft, non-tender, non-distended with normoactive bowel sounds. No hepatomegaly. No rebound/guarding. No obvious abdominal masses. Abdominal aorta is normal size without bruit Extremities: No edema. no cyanosis, no clubbing, no ulcers  Peripheral : 2+ bilateral upper extremity pulses, 2+ bilateral  femoral pulses, 2+ bilateral dorsal pedal pulse Neuro: Alert and oriented. No facial asymmetry. No focal deficit. Moves all extremities spontaneously. Musculoskeletal: Normal muscle tone without kyphosis Psych:  Responds to questions appropriately with a normal affect.    Assessment: 60 year old with the essential hypertension makes hyperlipidemia and coronary atherosclerosis with the Congo class for unstable angina and elevated troponin needing further treatment options  Plan: 1. Heparin for further risk reduction in cardiovascular event 2. Aspirin and beta blocker for possible myocardial infarction 3. Proceed to cardiac catheterization to assess coronary anatomy and further treatment thereof is necessary. Patient understands risk and benefits cardiac catheterization. This includes a possibility does stroke heart attack infection bleeding or blood clot. He is at low risk for conscious sedation  Signed, Lamar Blinks M.D. [facility] Cardiology 02/26/2016, 4:44 PM

## 2016-02-26 NOTE — ED Notes (Signed)
Patient transported to X-ray via wheelchair 

## 2016-02-26 NOTE — ED Provider Notes (Signed)
[facility] Emergency Department Provider Note   ____________________________________________  Time seen: Approximately 6:09 AM  I have reviewed the triage vital signs and the nursing notes.   HISTORY  Chief Complaint Chest Pain    HPI Tyler Russell is a 60 y.o. male who presents to the ED from home with a chief complaint of chest pain. Patient reports history of peptic ulcer seen on endoscopy 2 months ago, currently on Prilosec. Reports "eating too much" last night (seafood and ice cream) and began to experience belching and burning sensation in his lower chest/epigastrium approximately 10 PM. Vomited once. Took antacid with partial relief of symptoms. Wife states when patient has reflux then it makes him short of breath and he panics. Also reports increased stressors this week (trouble with daughter and grandson was arrested). Denies recent fever, chills, dysuria, diarrhea. Denies recent travel or trauma. Nothing makes his symptoms worse. Antacid partially relieved his symptoms.   Past Medical History  Diagnosis Date  . Coronary artery disease   . Hypercholesteremia     no medication at this time  . Chronic kidney disease   . Ulcer     on medication  . Headache(784.0)   . Arthritis   . Complication of anesthesia     had difficulty with breathing, had difficulty waking up  . Trigger finger     on both hands  . Chronic back pain   . Alcohol abuse   . Gonorrhea   . Hypertension     There are no active problems to display for this patient.   Past Surgical History  Procedure Laterality Date  . Knee surgery      left  . Cervical disc surgery      3 ruptured disc, 2012  . Lumbar fusion      2008  . Anterior lat lumbar fusion  08/28/2011    Procedure: ANTERIOR LATERAL LUMBAR FUSION 1 LEVEL;  Surgeon: Alvy Beal;  Location: [facility] OR;  Service: Orthopedics;  Laterality: N/A;  Lateral L3-4 Fusion Posterior Spinal Fusion L3-4, Possible Removal  of Hardware   . Vasectomy  1983  . Esophagogastroduodenoscopy (egd) with propofol N/A 11/23/2015    Procedure: ESOPHAGOGASTRODUODENOSCOPY (EGD) WITH PROPOFOL;  Surgeon: Christena Deem, MD;  Location: [facility] ENDOSCOPY;  Service: Endoscopy;  Laterality: N/A;    Current Outpatient Rx  Name  Route  Sig  Dispense  Refill  . aspirin 81 MG tablet   Oral   Take 81 mg by mouth daily.           Marland Kitchen gemfibrozil (LOPID) 600 MG tablet   Oral   Take 600 mg by mouth 2 (two) times daily before a meal.         . metoprolol tartrate (LOPRESSOR) 25 MG tablet   Oral   Take 25 mg by mouth 2 (two) times daily.          Marland Kitchen omeprazole (PRILOSEC) 20 MG capsule   Oral   Take 20 mg by mouth 2 (two) times daily.          . polyethylene glycol (MIRALAX / GLYCOLAX) packet   Oral   Take 17 g by mouth daily.             Allergies Vicodin and Milk-related compounds  No family history on file.  Social History Social History  Substance Use Topics  . Smoking status: Former Smoker    Types: Cigarettes  . Smokeless tobacco: None  Comment: stopped at age 58 years  . Alcohol Use: No     Comment: occassional    Review of Systems  Constitutional: No fever/chills. Eyes: No visual changes. ENT: No sore throat. Cardiovascular: Positive for chest pain. Respiratory: Denies shortness of breath. Gastrointestinal: Positive for abdominal pain.  Positive for one episode of vomiting.  No diarrhea.  No constipation. Genitourinary: Negative for dysuria. Musculoskeletal: Negative for back pain. Skin: Negative for rash. Neurological: Negative for headaches, focal weakness or numbness.  10-point ROS otherwise negative.  ____________________________________________   PHYSICAL EXAM:  VITAL SIGNS: ED Triage Vitals  Enc Vitals Group     BP 02/26/16 0314 185/97 mmHg     Pulse Rate 02/26/16 0314 79     Resp 02/26/16 0314 16     Temp 02/26/16 0314 97.6 F (36.4 C)     Temp Source 02/26/16 0314  Oral     SpO2 02/26/16 0314 99 %     Weight 02/26/16 0314 170 lb (77.111 kg)     Height 02/26/16 0314  (1.651 m)     Head Cir --      Peak Flow --      Pain Score 02/26/16 0315 4     Pain Loc --      Pain Edu? --      Excl. in GC? --     Constitutional: Alert and oriented. Well appearing and in no acute distress. Eyes: Conjunctivae are normal. PERRL. EOMI. Head: Atraumatic. Nose: No congestion/rhinnorhea. Mouth/Throat: Mucous membranes are moist.  Oropharynx non-erythematous. Neck: No stridor.   Cardiovascular: Normal rate, regular rhythm. Grossly normal heart sounds.  Good peripheral circulation. Respiratory: Normal respiratory effort.  No retractions. Lungs CTAB. Gastrointestinal: Soft and nontender. No distention. No abdominal bruits. No CVA tenderness. Musculoskeletal: No lower extremity tenderness nor edema.  No joint effusions. Neurologic:  Normal speech and language. No gross focal neurologic deficits are appreciated. No gait instability. Skin:  Skin is warm, dry and intact. No rash noted. Psychiatric: Mood and affect are normal. Speech and behavior are normal.  ____________________________________________   LABS (all labs ordered are listed, but only abnormal results are displayed)  Labs Reviewed  BASIC METABOLIC PANEL - Abnormal; Notable for the following:    Glucose, Bld 230 (*)    All other components within normal limits  CBC - Abnormal; Notable for the following:    HCT 39.0 (*)    MCV 79.3 (*)    All other components within normal limits  TROPONIN I  TROPONIN I   ____________________________________________  EKG  ED ECG REPORT I, SUNG,JADE J, the attending physician, personally viewed and interpreted this ECG.   Date: 02/26/2016  EKG Time: 0318  Rate: 82  Rhythm: normal EKG, normal sinus rhythm  Axis: Normal  Intervals:none  ST&T Change: Nonspecific  ____________________________________________  RADIOLOGY  Chest 2 view (viewed by me,  interpreted per Dr. Andria Meuse): Emphysematous changes in the lungs. No evidence of active pulmonary disease. ____________________________________________   PROCEDURES  Procedure(s) performed: None  Critical Care performed: No  ____________________________________________   INITIAL IMPRESSION / ASSESSMENT AND PLAN / ED COURSE  Pertinent labs & imaging results that were available during my care of the patient were reviewed by me and considered in my medical decision making (see chart for details).  60 year old male with peptic ulcer disease who presents with substernal chest burning after eating large amount of seafood and ice cream. Feels much better than he did at home currently. Initial troponin negative. Will  repeat troponin, check LFTs and lipase; administer GI cocktail and reassess.  ----------------------------------------- 8:10 AM on 02/26/2016 -----------------------------------------  Patient sleeping soundly in no acute distress. Updated patient on results of LFTs and lipase. Repeat troponin pending. Anticipate discharge home with recommendations for adding over-the-counter Prilosec to better control his symptoms. Care transferred to Dr. Sharma CovertNorman. ____________________________________________   FINAL CLINICAL IMPRESSION(S) / ED DIAGNOSES  Final diagnoses:  Gastroesophageal reflux disease with esophagitis      NEW MEDICATIONS STARTED DURING THIS VISIT:  New Prescriptions   No medications on file     Note:  This document was prepared using Dragon voice recognition software and may include unintentional dictation errors.    Irean HongJade J Sung, MD 02/26/16 [phone]

## 2016-02-26 NOTE — Progress Notes (Signed)
Dr. Gwen PoundsKowalski aware of third troponin. No new orders at this time.

## 2016-02-26 NOTE — H&P (Signed)
[facility] [place] [facility]   PATIENT NAME: Tyler MedianFreddie Sankey    MR#:  [id number]  DATE OF BIRTH:  23-Apr-1956  DATE OF ADMISSION:  02/26/2016  PRIMARY CARE PHYSICIAN: Rafael BihariWALKER III, JOHN B, MD   REQUESTING/REFERRING PHYSICIAN:  Dr. Rockne MenghiniAnne-Caroline Norman  CHIEF COMPLAINT:   Chief Complaint  Patient presents with  . Chest Pain    HISTORY OF PRESENT ILLNESS:  Tyler Russell  is a 60 y.o. male with a known history of Hypertension, hyperlipidemia, peptic ulcer disease and chronic low back pain presents to the hospital secondary to epigastric pain and also chest pressure that started last night. Patient states over the last few days he has been having exertional chest pressure which he not it could be most likely due to reflux symptoms. He had an upper GI endoscopy done in March 2017 and was noted to have small hiatal hernia and also esophagitis and gastritis. No active bleeding ulcers were noted at the time. He was started on Prilosec 40 mg daily and had seen symptomatic relief. He has noticed that in the last couple of days he was having lower chest pressure and epigastric pain not related to eating but that would improve after a while. Last night after eating seafood he developed epigastric pain again associated with chest pressure and presented to the emergency room. His first troponin was negative he was observed for second troponin which came back positive and so he is being admitted.  PAST MEDICAL HISTORY:   Past Medical History  Diagnosis Date  . Hypercholesteremia     no medication at this time  . Chronic kidney disease   . Ulcer     on medication  . Headache(784.0)   . Arthritis   . Complication of anesthesia     had difficulty with breathing, had difficulty waking up  . Trigger finger     on both hands  . Chronic back pain   . Alcohol abuse   . Gonorrhea   . Hypertension     PAST SURGICAL HISTORY:   Past Surgical History  Procedure  Laterality Date  . Knee surgery      left  . Cervical disc surgery      3 ruptured disc, 2012  . Lumbar fusion      2008  . Anterior lat lumbar fusion  08/28/2011    Procedure: ANTERIOR LATERAL LUMBAR FUSION 1 LEVEL;  Surgeon: Alvy Bealahari D Brooks;  Location: [facility] OR;  Service: Orthopedics;  Laterality: N/A;  Lateral L3-4 Fusion Posterior Spinal Fusion L3-4, Possible Removal of Hardware   . Vasectomy  1983  . Esophagogastroduodenoscopy (egd) with propofol N/A 11/23/2015    Procedure: ESOPHAGOGASTRODUODENOSCOPY (EGD) WITH PROPOFOL;  Surgeon: Christena DeemMartin U Skulskie, MD;  [facility];  Service: Endoscopy;  Laterality: N/A;    SOCIAL HISTORY:   Social History  Substance Use Topics  . Smoking status: Former Smoker    Types: Cigarettes  . Smokeless tobacco: Not on file     Comment: stopped at age 60 years  . Alcohol Use: No     Comment: occassional    FAMILY HISTORY:   Family History  Problem Relation Age of Onset  . CAD Father   . Stomach cancer Maternal Grandmother     DRUG ALLERGIES:   Allergies  Allergen Reactions  . Vicodin [Hydrocodone-Acetaminophen] Itching  . Milk-Related Compounds Other (See Comments)    Sneezing and post nasal drip    REVIEW OF SYSTEMS:   Review  of Systems  Constitutional: Negative for fever, chills, weight loss and malaise/fatigue.  HENT: Negative for ear discharge, ear pain, hearing loss, nosebleeds and tinnitus.   Eyes: Negative for blurred vision, double vision and photophobia.  Respiratory: Negative for cough, hemoptysis, shortness of breath and wheezing.   Cardiovascular: Positive for chest pain. Negative for palpitations, orthopnea and leg swelling.  Gastrointestinal: Positive for nausea and abdominal pain. Negative for heartburn, vomiting, diarrhea, constipation and melena.  Genitourinary: Negative for dysuria, urgency, frequency and hematuria.  Musculoskeletal: Negative for myalgias, back pain and neck pain.  Skin: Negative for rash.   Neurological: Negative for dizziness, tingling, tremors, sensory change, speech change, focal weakness and headaches.  Endo/Heme/Allergies: Does not bruise/bleed easily.  Psychiatric/Behavioral: Negative for depression.    MEDICATIONS AT HOME:   Prior to Admission medications   Medication Sig Start Date End Date Taking? Authorizing Provider  gemfibrozil (LOPID) 600 MG tablet Take 600 mg by mouth 2 (two) times daily before a meal.   Yes Historical Provider, MD  metoprolol tartrate (LOPRESSOR) 25 MG tablet Take 25 mg by mouth 2 (two) times daily.    Yes Historical Provider, MD  omeprazole (PRILOSEC) 20 MG capsule Take 20 mg by mouth daily.    Yes Historical Provider, MD      VITAL SIGNS:  Blood pressure 152/92, pulse 75, temperature 97.6 F (36.4 C), temperature source Oral, resp. rate 16, heigh khdF$  (1.651 m), weight 77.111 kg (170 lb), SpO2 96 %.  PHYSICAL EXAMINATION:   Physical Exam  GENERAL:  60 y.o.-year-old patient lying in the bed with no acute distress.  EYES: Pupils equal, round, reactive to light and accommodation. No scleral icterus. Extraocular muscles intact.  HEENT: Head atraumatic, normocephalic. Oropharynx and nasopharynx clear.  NECK:  Supple, no jugular venous distention. No thyroid enlargement, no tenderness.  LUNGS: Normal breath sounds bilaterally, no wheezing, rales,rhonchi or crepitation. No use of accessory muscles of respiration.  CARDIOVASCULAR: S1, S2 normal. No murmurs, rubs, or gallops.  ABDOMEN: Soft, nontender, nondistended. Bowel sounds present. No organomegaly or mass.  EXTREMITIES: No pedal edema, cyanosis, or clubbing.  NEUROLOGIC: Cranial nerves II through XII are intact. Muscle strength 5/5 in all extremities. Sensation intact. Gait not checked.  PSYCHIATRIC: The patient is alert and oriented x 3.  SKIN: No obvious rash, lesion, or ulcer.   LABORATORY PANEL:   CBC  Recent Labs Lab 02/26/16 0318  WBC 7.5  HGB 13.4  HCT 39.0*  PLT 320    ------------------------------------------------------------------------------------------------------------------  Chemistries   Recent Labs Lab 02/26/16 0318  NA 136  K 3.7  CL 104  CO2 25  GLUCOSE 230*  BUN 13  CREATININE 0.81  CALCIUM 9.4  AST 33  ALT 57  ALKPHOS 74  BILITOT 0.6   ------------------------------------------------------------------------------------------------------------------  Cardiac Enzymes  Recent Labs Lab 02/26/16 0706  TROPONINI 0.08*   ------------------------------------------------------------------------------------------------------------------  RADIOLOGY:  Dg Chest 2 View  02/26/2016  CLINICAL DATA:  Chest pain. Patient ate a large meal which aggravated his stomach ulcer. EXAM: CHEST  2 VIEW COMPARISON:  01/27/2011 FINDINGS: Emphysematous changes in the lungs. Normal heart size and pulmonary vascularity. No focal airspace disease or consolidation in the lungs. No blunting of costophrenic angles. No pneumothorax. Mediastinal contours appear intact. Postoperative change in the cervical spine. IMPRESSION: Emphysematous changes in the lungs. No evidence of active pulmonary disease. Electronically Signed   By: Burman Nieves M.D.   On: 02/26/2016 03:46    EKG:   Orders placed or performed during the  hospital encounter of 02/26/16  . EKG 12-Lead  . EKG 12-Lead  . ED EKG within 10 minutes  . ED EKG within 10 minutes    IMPRESSION AND PLAN:   Jareth Pardee  is a 60 y.o. male with a known history of Hypertension, hyperlipidemia, peptic ulcer disease and chronic low back pain presents to the hospital secondary to epigastric pain and also chest pressure that started last night.  #1 NSTEMI- slightly elevated second set of troponin - family h/o CAD - admit to tele, recycle troponins, cardiac consult - NPO after midnight if troponins worse- will need cath- if positive further If not- consider myoview in AM - check ECHO - enteric coated  asa. Nitro prn  #2 GERD- heart burn/chest pressure could be worsening GERD- change protonix to bid for now As mentioned above, had recent EGD in March 2017 with small hiatal hernia and esophagogastritis (was taking NSAIDS at the time)  #3 HTN- on metoprolol, also add norvasc as BP elevated  #4 Hyperlipidemia- continue gemfibrozil Check lipid panel  #5 DVT Prophylaxis- on lovenox    All the records are reviewed and case discussed with ED provider. Management plans discussed with the patient, family and they are in agreement.  CODE STATUS: Full Code  TOTAL TIME TAKING CARE OF THIS PATIENT: 50 minutes.    Enid Baas M.D on 02/26/2016 at 1:30 PM  Between 7am to 6pm - Pager - [phone]  After 6pm go to [email or web address] - password [email or web address] [facility]  [facility]  Office  [phone]  CC: Primary care physician; Rafael Bihari, MD

## 2016-02-26 NOTE — Progress Notes (Signed)
Inpatient Diabetes Program Recommendations  AACE/ADA: New Consensus Statement on Inpatient Glycemic Control (2015)  Target Ranges:  Prepandial:   less than 140 mg/dL      Peak postprandial:   less than 180 mg/dL (1-2 hours)      Critically ill patients:  140 - 180 mg/dL   No noted history of diabetes- no family history. Patient has hypertension and hyperlipidemia and elevated lab glucose. Please consider checking an A1C and ordering CBG tid and hs.  Susette RacerJulie Eryca Bolte, RN, BA, MHA, CDE Diabetes Coordinator Inpatient Diabetes Program  [phone] (Team Pager) [phone] [facility] Office) 02/26/2016 1:58 PM

## 2016-02-26 NOTE — Progress Notes (Signed)
Pt requesting something to ease anxiety and aide sleep. MD Dr. Sheryle Hailiamond notified, to place orders. Syliva Overmanassie A Delane Stalling, RN

## 2016-02-26 NOTE — ED Notes (Signed)
Xray report and lab results reviewed. Awaiting room for MD eval.

## 2016-02-26 NOTE — Discharge Instructions (Signed)
1. You may take Pepcid over-the-counter in addition to Prilosec to better control your reflux symptoms. 2. Return to the ER for worsening symptoms, persistent vomiting, difficulty breathing or other concerns.  Gastroesophageal Reflux Disease, Adult Normally, food travels down the esophagus and stays in the stomach to be digested. However, when a person has gastroesophageal reflux disease (GERD), food and stomach acid move back up into the esophagus. When this happens, the esophagus becomes sore and inflamed. Over time, GERD can create small holes (ulcers) in the lining of the esophagus.  CAUSES This condition is caused by a problem with the muscle between the esophagus and the stomach (lower esophageal sphincter, or LES). Normally, the LES muscle closes after food passes through the esophagus to the stomach. When the LES is weakened or abnormal, it does not close properly, and that allows food and stomach acid to go back up into the esophagus. The LES can be weakened by certain dietary substances, medicines, and medical conditions, including:  Tobacco use.  Pregnancy.  Having a hiatal hernia.  Heavy alcohol use.  Certain foods and beverages, such as coffee, chocolate, onions, and peppermint. RISK FACTORS This condition is more likely to develop in:  People who have an increased body weight.  People who have connective tissue disorders.  People who use NSAID medicines. SYMPTOMS Symptoms of this condition include:  Heartburn.  Difficult or painful swallowing.  The feeling of having a lump in the throat.  Abitter taste in the mouth.  Bad breath.  Having a large amount of saliva.  Having an upset or bloated stomach.  Belching.  Chest pain.  Shortness of breath or wheezing.  Ongoing (chronic) cough or a night-time cough.  Wearing away of tooth enamel.  Weight loss. Different conditions can cause chest pain. Make sure to see your health care provider if you experience  chest pain. DIAGNOSIS Your health care provider will take a medical history and perform a physical exam. To determine if you have mild or severe GERD, your health care provider may also monitor how you respond to treatment. You may also have other tests, including:  An endoscopy toexamine your stomach and esophagus with a small camera.  A test thatmeasures the acidity level in your esophagus.  A test thatmeasures how much pressure is on your esophagus.  A barium swallow or modified barium swallow to show the shape, size, and functioning of your esophagus. TREATMENT The goal of treatment is to help relieve your symptoms and to prevent complications. Treatment for this condition may vary depending on how severe your symptoms are. Your health care provider may recommend:  Changes to your diet.  Medicine.  Surgery. HOME CARE INSTRUCTIONS Diet  Follow a diet as recommended by your health care provider. This may involve avoiding foods and drinks such as:  Coffee and tea (with or without caffeine).  Drinks that containalcohol.  Energy drinks and sports drinks.  Carbonated drinks or sodas.  Chocolate and cocoa.  Peppermint and mint flavorings.  Garlic and onions.  Horseradish.  Spicy and acidic foods, including peppers, chili powder, curry powder, vinegar, hot sauces, and barbecue sauce.  Citrus fruit juices and citrus fruits, such as oranges, lemons, and limes.  Tomato-based foods, such as red sauce, chili, salsa, and pizza with red sauce.  Fried and fatty foods, such as donuts, french fries, potato chips, and high-fat dressings.  High-fat meats, such as hot dogs and fatty cuts of red and white meats, such as rib eye steak, sausage, ham,  and bacon.  High-fat dairy items, such as whole milk, butter, and cream cheese.  Eat small, frequent meals instead of large meals.  Avoid drinking large amounts of liquid with your meals.  Avoid eating meals during the 2-3 hours  before bedtime.  Avoid lying down right after you eat.  Do not exercise right after you eat. General Instructions  Pay attention to any changes in your symptoms.  Take over-the-counter and prescription medicines only as told by your health care provider. Do not take aspirin, ibuprofen, or other NSAIDs unless your health care provider told you to do so.  Do not use any tobacco products, including cigarettes, chewing tobacco, and e-cigarettes. If you need help quitting, ask your health care provider.  Wear loose-fitting clothing. Do not wear anything tight around your waist that causes pressure on your abdomen.  Raise (elevate) the head of your bed 6 inches (15cm).  Try to reduce your stress, such as with yoga or meditation. If you need help reducing stress, ask your health care provider.  If you are overweight, reduce your weight to an amount that is healthy for you. Ask your health care provider for guidance about a safe weight loss goal.  Keep all follow-up visits as told by your health care provider. This is important. SEEK MEDICAL CARE IF:  You have new symptoms.  You have unexplained weight loss.  You have difficulty swallowing, or it hurts to swallow.  You have wheezing or a persistent cough.  Your symptoms do not improve with treatment.  You have a hoarse voice. SEEK IMMEDIATE MEDICAL CARE IF:  You have pain in your arms, neck, jaw, teeth, or back.  You feel sweaty, dizzy, or light-headed.  You have chest pain or shortness of breath.  You vomit and your vomit looks like blood or coffee grounds.  You faint.  Your stool is bloody or black.  You cannot swallow, drink, or eat.   This information is not intended to replace advice given to you by your health care provider. Make sure you discuss any questions you have with your health care provider.   Document Released: 06/11/2005 Document Revised: 05/23/2015 Document Reviewed: 12/27/2014 Elsevier Interactive  Patient Education 2016 [employer]  Gastroesophageal Reflux Disease, Adult Normally, food travels down the esophagus and stays in the stomach to be digested. However, when a person has gastroesophageal reflux disease (GERD), food and stomach acid move back up into the esophagus. When this happens, the esophagus becomes sore and inflamed. Over time, GERD can create small holes (ulcers) in the lining of the esophagus.  CAUSES This condition is caused by a problem with the muscle between the esophagus and the stomach (lower esophageal sphincter, or LES). Normally, the LES muscle closes after food passes through the esophagus to the stomach. When the LES is weakened or abnormal, it does not close properly, and that allows food and stomach acid to go back up into the esophagus. The LES can be weakened by certain dietary substances, medicines, and medical conditions, including:  Tobacco use.  Pregnancy.  Having a hiatal hernia.  Heavy alcohol use.  Certain foods and beverages, such as coffee, chocolate, onions, and peppermint. RISK FACTORS This condition is more likely to develop in:  People who have an increased body weight.  People who have connective tissue disorders.  People who use NSAID medicines. SYMPTOMS Symptoms of this condition include:  Heartburn.  Difficult or painful swallowing.  The feeling of having a lump in the throat.  Abitter taste in the mouth.  Bad breath.  Having a large amount of saliva.  Having an upset or bloated stomach.  Belching.  Chest pain.  Shortness of breath or wheezing.  Ongoing (chronic) cough or a night-time cough.  Wearing away of tooth enamel.  Weight loss. Different conditions can cause chest pain. Make sure to see your health care provider if you experience chest pain. DIAGNOSIS Your health care provider will take a medical history and perform a physical exam. To determine if you have mild or severe GERD, your health care  provider may also monitor how you respond to treatment. You may also have other tests, including:  An endoscopy toexamine your stomach and esophagus with a small camera.  A test thatmeasures the acidity level in your esophagus.  A test thatmeasures how much pressure is on your esophagus.  A barium swallow or modified barium swallow to show the shape, size, and functioning of your esophagus. TREATMENT The goal of treatment is to help relieve your symptoms and to prevent complications. Treatment for this condition may vary depending on how severe your symptoms are. Your health care provider may recommend:  Changes to your diet.  Medicine.  Surgery. HOME CARE INSTRUCTIONS Diet  Follow a diet as recommended by your health care provider. This may involve avoiding foods and drinks such as:  Coffee and tea (with or without caffeine).  Drinks that containalcohol.  Energy drinks and sports drinks.  Carbonated drinks or sodas.  Chocolate and cocoa.  Peppermint and mint flavorings.  Garlic and onions.  Horseradish.  Spicy and acidic foods, including peppers, chili powder, curry powder, vinegar, hot sauces, and barbecue sauce.  Citrus fruit juices and citrus fruits, such as oranges, lemons, and limes.  Tomato-based foods, such as red sauce, chili, salsa, and pizza with red sauce.  Fried and fatty foods, such as donuts, french fries, potato chips, and high-fat dressings.  High-fat meats, such as hot dogs and fatty cuts of red and white meats, such as rib eye steak, sausage, ham, and bacon.  High-fat dairy items, such as whole milk, butter, and cream cheese.  Eat small, frequent meals instead of large meals.  Avoid drinking large amounts of liquid with your meals.  Avoid eating meals during the 2-3 hours before bedtime.  Avoid lying down right after you eat.  Do not exercise right after you eat. General Instructions  Pay attention to any changes in your  symptoms.  Take over-the-counter and prescription medicines only as told by your health care provider. Do not take aspirin, ibuprofen, or other NSAIDs unless your health care provider told you to do so.  Do not use any tobacco products, including cigarettes, chewing tobacco, and e-cigarettes. If you need help quitting, ask your health care provider.  Wear loose-fitting clothing. Do not wear anything tight around your waist that causes pressure on your abdomen.  Raise (elevate) the head of your bed 6 inches (15cm).  Try to reduce your stress, such as with yoga or meditation. If you need help reducing stress, ask your health care provider.  If you are overweight, reduce your weight to an amount that is healthy for you. Ask your health care provider for guidance about a safe weight loss goal.  Keep all follow-up visits as told by your health care provider. This is important. SEEK MEDICAL CARE IF:  You have new symptoms.  You have unexplained weight loss.  You have difficulty swallowing, or it hurts to swallow.  You have  wheezing or a persistent cough.  Your symptoms do not improve with treatment.  You have a hoarse voice. SEEK IMMEDIATE MEDICAL CARE IF:  You have pain in your arms, neck, jaw, teeth, or back.  You feel sweaty, dizzy, or light-headed.  You have chest pain or shortness of breath.  You vomit and your vomit looks like blood or coffee grounds.  You faint.  Your stool is bloody or black.  You cannot swallow, drink, or eat.   This information is not intended to replace advice given to you by your health care provider. Make sure you discuss any questions you have with your health care provider.   Document Released: 06/11/2005 Document Revised: 05/23/2015 Document Reviewed: 12/27/2014 Elsevier Interactive Patient Education [employer]

## 2016-02-27 ENCOUNTER — Encounter
Admission: EM | Disposition: A | Discharge status: Other Acute Inpatient Hospital | Payer: Self-pay | Source: Home / Self Care | Attending: Internal Medicine

## 2016-02-27 ENCOUNTER — Encounter: Payer: Self-pay | Admitting: Internal Medicine

## 2016-02-27 ENCOUNTER — Inpatient Hospital Stay (HOSPITAL_COMMUNITY)
Admission: AD | Admit: 2016-02-27 | Discharge: 2016-03-04 | DRG: 236 | Disposition: A | Discharge status: Home Health | Payer: Medicare Other | Source: Other Acute Inpatient Hospital | Attending: Surgery | Admitting: Surgery

## 2016-02-27 DIAGNOSIS — E785 Hyperlipidemia, unspecified: Secondary | ICD-10-CM | POA: Diagnosis present

## 2016-02-27 DIAGNOSIS — Z79899 Other long term (current) drug therapy: Secondary | ICD-10-CM | POA: Diagnosis not present

## 2016-02-27 DIAGNOSIS — K449 Diaphragmatic hernia without obstruction or gangrene: Secondary | ICD-10-CM | POA: Diagnosis present

## 2016-02-27 DIAGNOSIS — M1990 Unspecified osteoarthritis, unspecified site: Secondary | ICD-10-CM | POA: Diagnosis present

## 2016-02-27 DIAGNOSIS — I214 Non-ST elevation (NSTEMI) myocardial infarction: Principal | ICD-10-CM | POA: Diagnosis present

## 2016-02-27 DIAGNOSIS — N189 Chronic kidney disease, unspecified: Secondary | ICD-10-CM | POA: Diagnosis present

## 2016-02-27 DIAGNOSIS — E1122 Type 2 diabetes mellitus with diabetic chronic kidney disease: Secondary | ICD-10-CM | POA: Diagnosis present

## 2016-02-27 DIAGNOSIS — K2970 Gastritis, unspecified, without bleeding: Secondary | ICD-10-CM | POA: Diagnosis present

## 2016-02-27 DIAGNOSIS — I129 Hypertensive chronic kidney disease with stage 1 through stage 4 chronic kidney disease, or unspecified chronic kidney disease: Secondary | ICD-10-CM | POA: Diagnosis present

## 2016-02-27 DIAGNOSIS — I080 Rheumatic disorders of both mitral and aortic valves: Secondary | ICD-10-CM | POA: Diagnosis not present

## 2016-02-27 DIAGNOSIS — E7800 Pure hypercholesterolemia, unspecified: Secondary | ICD-10-CM | POA: Diagnosis present

## 2016-02-27 DIAGNOSIS — Z91011 Allergy to milk products: Secondary | ICD-10-CM

## 2016-02-27 DIAGNOSIS — Z950 Presence of cardiac pacemaker: Secondary | ICD-10-CM | POA: Diagnosis not present

## 2016-02-27 DIAGNOSIS — Z951 Presence of aortocoronary bypass graft: Secondary | ICD-10-CM

## 2016-02-27 DIAGNOSIS — J9811 Atelectasis: Secondary | ICD-10-CM | POA: Diagnosis not present

## 2016-02-27 DIAGNOSIS — M549 Dorsalgia, unspecified: Secondary | ICD-10-CM | POA: Diagnosis present

## 2016-02-27 DIAGNOSIS — Z01818 Encounter for other preprocedural examination: Secondary | ICD-10-CM | POA: Diagnosis not present

## 2016-02-27 DIAGNOSIS — I25110 Atherosclerotic heart disease of native coronary artery with unstable angina pectoris: Secondary | ICD-10-CM

## 2016-02-27 DIAGNOSIS — G8929 Other chronic pain: Secondary | ICD-10-CM | POA: Diagnosis present

## 2016-02-27 DIAGNOSIS — Z87891 Personal history of nicotine dependence: Secondary | ICD-10-CM | POA: Diagnosis not present

## 2016-02-27 DIAGNOSIS — I10 Essential (primary) hypertension: Secondary | ICD-10-CM | POA: Diagnosis not present

## 2016-02-27 DIAGNOSIS — K219 Gastro-esophageal reflux disease without esophagitis: Secondary | ICD-10-CM | POA: Diagnosis not present

## 2016-02-27 DIAGNOSIS — E782 Mixed hyperlipidemia: Secondary | ICD-10-CM | POA: Diagnosis not present

## 2016-02-27 DIAGNOSIS — Z885 Allergy status to narcotic agent status: Secondary | ICD-10-CM | POA: Diagnosis not present

## 2016-02-27 DIAGNOSIS — I2510 Atherosclerotic heart disease of native coronary artery without angina pectoris: Secondary | ICD-10-CM | POA: Diagnosis present

## 2016-02-27 DIAGNOSIS — Z01811 Encounter for preprocedural respiratory examination: Secondary | ICD-10-CM

## 2016-02-27 DIAGNOSIS — R7989 Other specified abnormal findings of blood chemistry: Secondary | ICD-10-CM | POA: Diagnosis not present

## 2016-02-27 DIAGNOSIS — K210 Gastro-esophageal reflux disease with esophagitis: Secondary | ICD-10-CM | POA: Diagnosis not present

## 2016-02-27 HISTORY — PX: CARDIAC CATHETERIZATION: SHX172

## 2016-02-27 LAB — COMPREHENSIVE METABOLIC PANEL
ALT: 64 U/L — AB (ref 17–63)
AST: 40 U/L (ref 15–41)
Albumin: 3.7 g/dL (ref 3.5–5.0)
Alkaline Phosphatase: 66 U/L (ref 38–126)
Anion gap: 6 (ref 5–15)
BILIRUBIN TOTAL: 0.5 mg/dL (ref 0.3–1.2)
BUN: 10 mg/dL (ref 6–20)
CHLORIDE: 106 mmol/L (ref 101–111)
CO2: 26 mmol/L (ref 22–32)
Calcium: 9 mg/dL (ref 8.9–10.3)
Creatinine, Ser: 0.83 mg/dL (ref 0.61–1.24)
Glucose, Bld: 138 mg/dL — ABNORMAL HIGH (ref 65–99)
POTASSIUM: 4 mmol/L (ref 3.5–5.1)
Sodium: 138 mmol/L (ref 135–145)
TOTAL PROTEIN: 6.8 g/dL (ref 6.5–8.1)

## 2016-02-27 LAB — CBC
HEMATOCRIT: 38.3 % — AB (ref 40.0–52.0)
HEMOGLOBIN: 12.8 g/dL — AB (ref 13.0–18.0)
MCH: 26.8 pg (ref 26.0–34.0)
MCHC: 33.4 g/dL (ref 32.0–36.0)
MCV: 80.3 fL (ref 80.0–100.0)
PLATELETS: 308 10*3/uL (ref 150–440)
RBC: 4.77 MIL/uL (ref 4.40–5.90)
RDW: 14.1 % (ref 11.5–14.5)
WBC: 6.3 10*3/uL (ref 3.8–10.6)

## 2016-02-27 LAB — MRSA PCR SCREENING: MRSA BY PCR: NEGATIVE

## 2016-02-27 LAB — GLUCOSE, CAPILLARY: Glucose-Capillary: 142 mg/dL — ABNORMAL HIGH (ref 65–99)

## 2016-02-27 LAB — BASIC METABOLIC PANEL
Anion gap: 8 (ref 5–15)
BUN: 11 mg/dL (ref 6–20)
CO2: 24 mmol/L (ref 22–32)
Calcium: 8.9 mg/dL (ref 8.9–10.3)
Chloride: 107 mmol/L (ref 101–111)
Creatinine, Ser: 0.72 mg/dL (ref 0.61–1.24)
GFR calc Af Amer: >60 mL/min (ref >60)
GFR calc non Af Amer: >60 mL/min (ref >60)
Glucose, Bld: 138 mg/dL — ABNORMAL HIGH (ref 65–99)
Potassium: 4 mmol/L (ref 3.5–5.1)
Sodium: 139 mmol/L (ref 135–145)

## 2016-02-27 LAB — PROTIME-INR
INR: 1.08
INR: 1.16 (ref 0.00–1.49)
PROTHROMBIN TIME: 15 s (ref 11.6–15.2)
Prothrombin Time: 14.2 seconds (ref 11.4–15.0)

## 2016-02-27 LAB — HEPARIN LEVEL (UNFRACTIONATED): HEPARIN UNFRACTIONATED: 0.21 [IU]/mL — AB (ref 0.30–0.70)

## 2016-02-27 LAB — TROPONIN I
TROPONIN I: 0.19 ng/mL — AB (ref <0.031)
Troponin I: 0.17 ng/mL — ABNORMAL HIGH (ref <0.031)

## 2016-02-27 LAB — APTT: APTT: 27 s (ref 24–37)

## 2016-02-27 SURGERY — LEFT HEART CATH AND CORONARY ANGIOGRAPHY
Anesthesia: Moderate Sedation

## 2016-02-27 MED ORDER — NITROGLYCERIN 0.4 MG SL SUBL
SUBLINGUAL_TABLET | SUBLINGUAL | Status: DC | PRN
  Administered 2016-02-27 (×2): .4 mg via SUBLINGUAL

## 2016-02-27 MED ORDER — NITROGLYCERIN 0.4 MG SL SUBL
SUBLINGUAL_TABLET | SUBLINGUAL | Status: AC
  Filled 2016-02-27: qty 1

## 2016-02-27 MED ORDER — ASPIRIN 81 MG PO CHEW
CHEWABLE_TABLET | ORAL | Status: DC | PRN
  Administered 2016-02-27: 324 mg via ORAL

## 2016-02-27 MED ORDER — ASPIRIN 81 MG PO CHEW
CHEWABLE_TABLET | ORAL | Status: AC
  Filled 2016-02-27: qty 4

## 2016-02-27 MED ORDER — ASPIRIN 81 MG PO CHEW
324.0000 mg | CHEWABLE_TABLET | ORAL | Status: AC
  Filled 2016-02-27: qty 4

## 2016-02-27 MED ORDER — ASPIRIN EC 81 MG PO TBEC
81.0000 mg | DELAYED_RELEASE_TABLET | Freq: Every day | ORAL | Status: DC
  Administered 2016-02-28: 81 mg via ORAL
  Filled 2016-02-27: qty 1

## 2016-02-27 MED ORDER — SODIUM CHLORIDE 0.9% FLUSH
3.0000 mL | Freq: Two times a day (BID) | INTRAVENOUS | Status: DC
  Administered 2016-02-27 – 2016-02-28 (×3): 3 mL via INTRAVENOUS

## 2016-02-27 MED ORDER — SODIUM CHLORIDE 0.9 % IV SOLN
250.0000 mL | INTRAVENOUS | Status: DC | PRN
  Administered 2016-02-27: 10 mL via INTRAVENOUS
  Administered 2016-02-28: 1000 mL via INTRAVENOUS

## 2016-02-27 MED ORDER — FENTANYL CITRATE (PF) 100 MCG/2ML IJ SOLN
INTRAMUSCULAR | Status: DC | PRN
  Administered 2016-02-27: 25 ug via INTRAVENOUS

## 2016-02-27 MED ORDER — NITROGLYCERIN 2 % TD OINT
TOPICAL_OINTMENT | TRANSDERMAL | Status: DC | PRN
  Administered 2016-02-27: 1 [in_us] via TOPICAL

## 2016-02-27 MED ORDER — NITROGLYCERIN 2 % TD OINT
TOPICAL_OINTMENT | TRANSDERMAL | Status: AC
  Filled 2016-02-27: qty 1

## 2016-02-27 MED ORDER — MIDAZOLAM HCL 2 MG/2ML IJ SOLN
INTRAMUSCULAR | Status: AC
  Filled 2016-02-27: qty 2

## 2016-02-27 MED ORDER — METOPROLOL TARTRATE 25 MG PO TABS
25.0000 mg | ORAL_TABLET | Freq: Two times a day (BID) | ORAL | Status: DC
  Administered 2016-02-27 – 2016-02-28 (×4): 25 mg via ORAL
  Administered 2016-02-29 (×2): 12.5 mg via ORAL
  Filled 2016-02-27 (×4): qty 1
  Filled 2016-02-27: qty 2
  Filled 2016-02-27: qty 1

## 2016-02-27 MED ORDER — PANTOPRAZOLE SODIUM 40 MG PO TBEC
40.0000 mg | DELAYED_RELEASE_TABLET | Freq: Every day | ORAL | Status: DC
  Administered 2016-02-27 – 2016-02-28 (×2): 40 mg via ORAL
  Filled 2016-02-27 (×2): qty 1

## 2016-02-27 MED ORDER — METOPROLOL TARTRATE 5 MG/5ML IV SOLN
INTRAVENOUS | Status: AC
  Filled 2016-02-27: qty 5

## 2016-02-27 MED ORDER — METOPROLOL TARTRATE 5 MG/5ML IV SOLN
INTRAVENOUS | Status: DC | PRN
Start: 2016-02-27 — End: 2016-02-27
  Administered 2016-02-27: 5 mg via INTRAVENOUS

## 2016-02-27 MED ORDER — FENTANYL CITRATE (PF) 100 MCG/2ML IJ SOLN
INTRAMUSCULAR | Status: AC
  Filled 2016-02-27: qty 2

## 2016-02-27 MED ORDER — GEMFIBROZIL 600 MG PO TABS
600.0000 mg | ORAL_TABLET | Freq: Two times a day (BID) | ORAL | Status: DC
  Administered 2016-02-27 – 2016-03-01 (×4): 600 mg via ORAL
  Filled 2016-02-27 (×6): qty 1

## 2016-02-27 MED ORDER — ACETAMINOPHEN 325 MG PO TABS
650.0000 mg | ORAL_TABLET | ORAL | Status: DC | PRN
  Administered 2016-02-27: 650 mg via ORAL
  Filled 2016-02-27: qty 2

## 2016-02-27 MED ORDER — IOPAMIDOL (ISOVUE-300) INJECTION 61%
INTRAVENOUS | Status: DC | PRN
Start: 2016-02-27 — End: 2016-02-27
  Administered 2016-02-27: 115 mL via INTRA_ARTERIAL

## 2016-02-27 MED ORDER — ONDANSETRON HCL 4 MG/2ML IJ SOLN: 4.0000 mg | Freq: Four times a day (QID) | INTRAMUSCULAR | Status: DC | PRN

## 2016-02-27 MED ORDER — MIDAZOLAM HCL 2 MG/2ML IJ SOLN
INTRAMUSCULAR | Status: DC | PRN
  Administered 2016-02-27: 1 mg via INTRAVENOUS

## 2016-02-27 MED ORDER — CETYLPYRIDINIUM CHLORIDE 0.05 % MT LIQD
7.0000 mL | Freq: Two times a day (BID) | OROMUCOSAL | Status: DC
  Administered 2016-02-27 – 2016-02-28 (×3): 7 mL via OROMUCOSAL

## 2016-02-27 MED ORDER — HEPARIN (PORCINE) IN NACL 2-0.9 UNIT/ML-% IJ SOLN
INTRAMUSCULAR | Status: AC
  Filled 2016-02-27: qty 500

## 2016-02-27 MED ORDER — ALPRAZOLAM 0.25 MG PO TABS
0.2500 mg | ORAL_TABLET | Freq: Two times a day (BID) | ORAL | Status: DC | PRN
  Filled 2016-02-27: qty 1

## 2016-02-27 MED ORDER — NITROGLYCERIN IN D5W 200-5 MCG/ML-% IV SOLN
3.0000 ug/min | INTRAVENOUS | Status: DC
Start: 2016-02-27 — End: 2016-02-29
  Administered 2016-02-27: 3 ug/min via INTRAVENOUS
  Filled 2016-02-27: qty 250

## 2016-02-27 MED ORDER — SODIUM CHLORIDE 0.9% FLUSH: 3.0000 mL | INTRAVENOUS | Status: DC | PRN

## 2016-02-27 MED ORDER — ZOLPIDEM TARTRATE 5 MG PO TABS: 5.0000 mg | ORAL_TABLET | Freq: Every evening | ORAL | Status: DC | PRN

## 2016-02-27 MED ORDER — ROSUVASTATIN CALCIUM 20 MG PO TABS
20.0000 mg | ORAL_TABLET | Freq: Every day | ORAL | Status: DC
  Filled 2016-02-27: qty 1

## 2016-02-27 MED ORDER — ASPIRIN 300 MG RE SUPP: 300.0000 mg | RECTAL | Status: AC

## 2016-02-27 MED ORDER — HEPARIN (PORCINE) IN NACL 100-0.45 UNIT/ML-% IJ SOLN
1050.0000 [IU]/h | INTRAMUSCULAR | Status: DC
  Administered 2016-02-27: 900 [IU]/h via INTRAVENOUS
  Administered 2016-02-28: 1050 [IU]/h via INTRAVENOUS
  Filled 2016-02-27 (×2): qty 250

## 2016-02-27 SURGICAL SUPPLY — 10 items
CATH INFINITI 5FR ANG PIGTAIL (CATHETERS) ×1 IMPLANT
CATH INFINITI 5FR JL4 (CATHETERS) ×1 IMPLANT
CATH INFINITI JR4 5F (CATHETERS) ×1 IMPLANT
DEVICE CLOSURE MYNXGRIP 5F (Vascular Products) ×1 IMPLANT
KIT MANI 3VAL PERCEP (MISCELLANEOUS) ×2 IMPLANT
NDL PERC 18GX7CM (NEEDLE) IMPLANT
NEEDLE PERC 18GX7CM (NEEDLE) ×2 IMPLANT
PACK CARDIAC CATH (CUSTOM PROCEDURE TRAY) ×2 IMPLANT
SHEATH PINNACLE 5F 10CM (SHEATH) ×1 IMPLANT
WIRE EMERALD 3MM-J .035X150CM (WIRE) ×1 IMPLANT

## 2016-02-27 NOTE — H&P (Signed)
Subjective:  CC: chest pain  History of present illness: The patient is a 60 year old male transferred from [facility] where he was evaluated for chest pain. The symptoms began last night. He notes a few day history of exertional chest pressure that he related to reflux symptoms. In March 2017 he was noted to have a hiatal hernia by endoscopy as well as esophagitis and gastritis. He was started on Prilosec for this. Over the past few days he has had lower chest pressure with epigastric discomfort he related to eating that would improve after a while. He also has some radiation to both arms. On the night of presentation he developed chest pressure that prompted him to visit the emergency department. Initial troponin was negative however the second was positive and he was admitted for further evaluation and treatment. He was seen by cardiology consultation and did rule in for non-STEMI. He is felt to require cardiac catheterization in the results are listed below including severe left main and three-vessel coronary artery disease. He was transferred to [facility] for further evaluation and management to include proposed CABG surgery.   Patient Active Problem List   Diagnosis Date Noted  . NSTEMI (non-ST elevated myocardial infarction) (HCC) 02/26/2016   Past Medical History  Diagnosis Date  . Hypercholesteremia     no medication at this time  . Chronic kidney disease   . Ulcer     on medication  . Headache(784.0)   . Arthritis   . Complication of anesthesia     had difficulty with breathing, had difficulty waking up  . Trigger finger     on both hands  . Chronic back pain   . Alcohol abuse   . Gonorrhea   . Hypertension     Past Surgical History  Procedure Laterality Date  . Knee surgery      left  . Cervical disc surgery      3 ruptured disc, 2012  . Lumbar fusion      2008  . Anterior lat lumbar fusion  08/28/2011    Procedure: ANTERIOR LATERAL LUMBAR FUSION  1 LEVEL;  Surgeon: Alvy Beal;  Location: [facility] OR;  Service: Orthopedics;  Laterality: N/A;  Lateral L3-4 Fusion Posterior Spinal Fusion L3-4, Possible Removal of Hardware   . Vasectomy  1983  . Esophagogastroduodenoscopy (egd) with propofol N/A 11/23/2015    Procedure: ESOPHAGOGASTRODUODENOSCOPY (EGD) WITH PROPOFOL;  Surgeon: Christena Deem, MD;  Location: [facility] ENDOSCOPY;  Service: Endoscopy;  Laterality: N/A;  . Cardiac catheterization N/A 02/27/2016    Procedure: Left Heart Cath and Coronary Angiography;  Surgeon: Lamar Blinks, MD;  Location: [facility] INVASIVE CV LAB;  Service: Cardiovascular;  Laterality: N/A;    Prescriptions prior to admission  Medication Sig Dispense Refill Last Dose  . gemfibrozil (LOPID) 600 MG tablet Take 600 mg by mouth 2 (two) times daily before a meal.   02/25/2016 at Unknown time  . metoprolol tartrate (LOPRESSOR) 25 MG tablet Take 25 mg by mouth 2 (two) times daily.    02/25/2016 at Unknown time  . omeprazole (PRILOSEC) 20 MG capsule Take 20 mg by mouth daily.    02/25/2016 at Unknown time   Allergies  Allergen Reactions  . Vicodin [Hydrocodone-Acetaminophen] Itching  . Milk-Related Compounds Other (See Comments)    Sneezing and post nasal drip    Social History  Substance Use Topics  . Smoking status: Former Smoker    Types: Cigarettes  . Smokeless tobacco: Not on  file     Comment: stopped at age 41 years  . Alcohol Use: No     Comment: occassional    Family History  Problem Relation Age of Onset  . CAD Father   . Stomach cancer Maternal Grandmother       Inpatient Medications:  . amLODipine 5 mg Oral Daily  . aspirin EC 81 mg Oral Daily  . enoxaparin (LOVENOX) injection 1 mg/kg Subcutaneous Q12H  . gemfibrozil 600 mg Oral BID AC  . insulin aspart 0-5 Units Subcutaneous QHS  . insulin aspart 0-9 Units Subcutaneous TID WC  . metoprolol tartrate 25 mg Oral BID  . pantoprazole 40 mg Oral BID AC    . sodium chloride          Results for orders placed or performed during the hospital encounter of 02/26/16 (from the past 24 hour(s))  CBC     Status: Abnormal   Collection Time: 02/26/16  2:54 PM  Result Value Ref Range   WBC 6.2 3.8 - 10.6 K/uL   RBC 4.73 4.40 - 5.90 MIL/uL   Hemoglobin 12.6 (L) 13.0 - 18.0 g/dL   HCT 16.1 (L) 09.6 - 04.5 %   MCV 79.9 (L) 80.0 - 100.0 fL   MCH 26.7 26.0 - 34.0 pg   MCHC 33.4 32.0 - 36.0 g/dL   RDW 40.9 81.1 - 91.4 %   Platelets 302 150 - 440 K/uL  Hemoglobin A1c     Status: Abnormal   Collection Time: 02/26/16  2:54 PM  Result Value Ref Range   Hgb A1c MFr Bld 8.6 (H) 4.0 - 6.0 %  Troponin I     Status: Abnormal   Collection Time: 02/26/16  4:14 PM  Result Value Ref Range   Troponin I 0.21 (H) <0.031 ng/mL  Lipid panel     Status: Abnormal   Collection Time: 02/26/16  4:14 PM  Result Value Ref Range   Cholesterol 228 (H) 0 - 200 mg/dL   Triglycerides 782 <956 mg/dL   HDL 41 >21 mg/dL   Total CHOL/HDL Ratio 5.6 RATIO   VLDL 29 0 - 40 mg/dL   LDL Cholesterol 308 (H) 0 - 99 mg/dL  Glucose, capillary     Status: Abnormal   Collection Time: 02/26/16  4:44 PM  Result Value Ref Range   Glucose-Capillary 275 (H) 65 - 99 mg/dL  Glucose, capillary     Status: Abnormal   Collection Time: 02/26/16  9:22 PM  Result Value Ref Range   Glucose-Capillary 152 (H) 65 - 99 mg/dL   Comment 1 Notify RN   Troponin I     Status: Abnormal   Collection Time: 02/26/16 10:52 PM  Result Value Ref Range   Troponin I 0.19 (H) <0.031 ng/mL  Troponin I     Status: Abnormal   Collection Time: 02/27/16  3:51 AM  Result Value Ref Range   Troponin I 0.17 (H) <0.031 ng/mL  CBC     Status: Abnormal   Collection Time: 02/27/16  3:51 AM  Result Value Ref Range   WBC 6.3 3.8 - 10.6 K/uL   RBC 4.77 4.40 - 5.90 MIL/uL   Hemoglobin 12.8 (L) 13.0 - 18.0 g/dL   HCT 65.7 (L) 84.6 - 96.2 %   MCV 80.3 80.0 - 100.0 fL   MCH 26.8 26.0 - 34.0 pg   MCHC 33.4 32.0  - 36.0 g/dL   RDW 95.2 84.1 - 32.4 %   Platelets 308 150 - 440  K/uL  Basic metabolic panel     Status: Abnormal   Collection Time: 02/27/16  3:51 AM  Result Value Ref Range   Sodium 139 135 - 145 mmol/L   Potassium 4.0 3.5 - 5.1 mmol/L   Chloride 107 101 - 111 mmol/L   CO2 24 22 - 32 mmol/L   Glucose, Bld 138 (H) 65 - 99 mg/dL   BUN 11 6 - 20 mg/dL   Creatinine, Ser 5.40 0.61 - 1.24 mg/dL   Calcium 8.9 8.9 - 98.1 mg/dL   GFR calc non Af Amer >60 >60 mL/min   GFR calc Af Amer >60 >60 mL/min   Anion gap 8 5 - 15  Protime-INR     Status: None   Collection Time: 02/27/16  3:51 AM  Result Value Ref Range   Prothrombin Time 14.2 11.4 - 15.0 seconds   INR 1.08   Glucose, capillary     Status: Abnormal   Collection Time: 02/27/16  8:24 AM  Result Value Ref Range   Glucose-Capillary 142 (H) 65 - 99 mg/dL      Procedures    Left Heart Cath and Coronary Angiography    Conclusion     LM lesion, 99% stenosed.  Ost LM lesion, 80% stenosed.  Ost Cx to Prox Cx lesion, 85% stenosed.  Ost Ramus to Ramus lesion, 80% stenosed.  Mid Cx lesion, 50% stenosed.  Prox LAD to Mid LAD lesion, 70% stenosed.  Prox RCA to Mid RCA lesion, 80% stenosed.  Mid RCA lesion, 85% stenosed.  Assessment The patient has had progressive canadian class 4 anginal symptoms with a nstemi And risk factors including high blood pressure and high cholesterol.  normal left ventricular function with ejection fraction of 60%  severe 3 vessel coronary artery disease   There is significant stenosis of left main, ostial lad , and lcx, and rca  Plan Continue medical management of CAD risk factors, Consider consultation for CABG and Additional medications for management of angina    Review of Systems Constitutional: negative Respiratory: negative Cardiovascular: positive for chest pressure/discomfort Gastrointestinal: positive for dyspepsia, reflux symptoms and  vomiting Genitourinary:negative Hematologic/lymphatic: negative Musculoskeletal:negative Neurological: negative Behavioral/Psych: negative Endocrine: positive for "borderline DM" Allergic/Immunologic: negative  Objective:         There were no vitals taken for this visit. General appearance: alert, cooperative, appears stated age and no distress Head: Normocephalic, without obvious abnormality, atraumatic Eyes: Perrla, Eomi, anicteric Throat: has pharnx clear of exudates or erethema + full dentures Neck: no adenopathy, no carotid bruit, no JVD, supple, symmetrical, trachea midline and thyroid not enlarged, symmetric, no tenderness/mass/nodules Back: symmetric, no curvature. ROM normal. No CVA tenderness. Lungs: clear to auscultation bilaterally Chest wall: no tenderness Heart: regular rate and rhythm, S1, S2 normal, no murmur, click, rub or gallop Abdomen: soft, non-tender; bowel sounds normal; no masses,  no organomegaly Male genitalia: normal, deferred Rectal: deferred Extremities: extremities normal, atraumatic, no cyanosis or edema Pulses: 2+ and symmetric Skin: Skin color, texture, turgor normal. No rashes or lesions Lymph nodes: Cervical, supraclavicular, and axillary nodes normal. Neurologic: Grossly normal  Assessment:  Chest pain due to non-ST segment myocardial infarction with severe Left main and  multivessel coronary artery disease  Plan:    CABG , probably Friday unless becomes unstable, will place on Nitro/heparin   Addendum:  I have reviewed his cath films, echo and medical record and examined the patient. He has had a few weeks of exertional and post-prandial chest pain. Cath shows  a 99% ostial LAD stenosis, 70% proximal LAD stenosis, 85% proximal LCX stenosis, 80% ramus stenosis and 80% proximal to mid RCA stenosis. EF is 60%. He denies any chest pain today. I agree with the need for CABG. I discussed the operative procedure with the patient and family  including alternatives, benefits and risks; including but not limited to bleeding, blood transfusion, infection, stroke, myocardial infarction, graft failure, heart block requiring a permanent pacemaker, organ dysfunction, and death.  Tyler Russell understands and agrees to proceed.  We will schedule surgery for Friday morning as long as he remains stable and symptom free on heparin and NTG.

## 2016-02-27 NOTE — Discharge Summary (Signed)
Tyler Russell, is a 60 y.o. male  DOB [birth year]  MRN [id number].  Admission date:  02/26/2016  Admitting Physician  Enid Baasadhika Kalisetti, MD  Discharge Date:  02/27/2016   Primary MD  Rafael BihariWALKER III, JOHN B, MD  Recommendations for primary care physician for things to follow:  is going to [facility] for [facility]G.   Admission Diagnosis  Elevated troponin [R79.89] Gastroesophageal reflux disease with esophagitis [K21.0]   Discharge Diagnosis  Elevated troponin [R79.89] Gastroesophageal reflux disease with esophagitis [K21.0]    Active Problems:   NSTEMI (non-ST elevated myocardial infarction) [facility](HCC)      Past Medical History  Diagnosis Date  . Hypercholesteremia     no medication at this time  . Chronic kidney disease   . Ulcer     on medication  . Headache(784.0)   . Arthritis   . Complication of anesthesia     had difficulty with breathing, had difficulty waking up  . Trigger finger     on both hands  . Chronic back pain   . Alcohol abuse   . Gonorrhea   . Hypertension     Past Surgical History  Procedure Laterality Date  . Knee surgery      left  . Cervical disc surgery      3 ruptured disc, 2012  . Lumbar fusion      2008  . Anterior lat lumbar fusion  08/28/2011    Procedure: ANTERIOR LATERAL LUMBAR FUSION 1 LEVEL;  Surgeon: Alvy Bealahari D Brooks;  Location: [facility] OR;  Service: Orthopedics;  Laterality: N/A;  Lateral L3-4 Fusion Posterior Spinal Fusion L3-4, Possible Removal of Hardware   . Vasectomy  1983  . Esophagogastroduodenoscopy (egd) with propofol N/A 11/23/2015    Procedure: ESOPHAGOGASTRODUODENOSCOPY (EGD) WITH PROPOFOL;  Surgeon: Christena DeemMartin U Skulskie, MD;  [facility];  Service: Endoscopy;  Laterality: N/A;       History of present illness and  Hospital Course:     Kindly see H&P for  history of present illness and admission details, please review complete Labs, Consult reports and Test reports for all details in brief  HPI  from the history and physical done on the day of admission 60 year old male patient with essential hypertension, hyperlipidemia came in because of chest pain.   Hospital Course  #1 chest pain secondary to non-ST elevation MI: Admitted to telemetry, started on aspirin, nitrates, and went up to 0.19. Patient was taken to cardiac catheterization by Dr. Gwen PoundsKowalski, the catheter showed a severe three-vessel coronary artery disease, 99% stenosis of the left main. Patient will be [facility]. She did receive beta blockers, aspirin, treatment doseLovenox at 75 my milligrams every 12 hours. 2.HLP:start high intensity statins 3.GERD;continue PPI   Discharge Condition: stable   Follow UP  Follow-up Information    Follow up with Danne HarborWALKER III, Letta PateJOHN B, MD. Schedule an appointment as soon as possible for a visit in 3 days.   Specialty:  Internal Medicine   Contact information:   [address] [facility] [place] [id number] [phone]         Discharge Instructions  and  Discharge Medications   medications as per [facility]   Medication List    ASK your doctor about these medications        gemfibrozil 600 MG tablet  Commonly known as:  LOPID  Take 600 mg by mouth 2 (two) times daily before a meal.  metoprolol tartrate 25 MG tablet  Commonly known as:  LOPRESSOR  Take 25 mg by mouth 2 (two) times daily.     omeprazole 20 MG capsule  Commonly known as:  PRILOSEC  Take 20 mg by mouth daily.          Diet and Activity recommendation: See Discharge Instructions above   Consults obtained -cardio   Major procedures and Radiology Reports - PLEASE review detailed and final reports for all details, in brief -      Dg Chest 2 View  02/26/2016  CLINICAL DATA:  Chest pain. Patient ate  a large meal which aggravated his stomach ulcer. EXAM: CHEST  2 VIEW COMPARISON:  01/27/2011 FINDINGS: Emphysematous changes in the lungs. Normal heart size and pulmonary vascularity. No focal airspace disease or consolidation in the lungs. No blunting of costophrenic angles. No pneumothorax. Mediastinal contours appear intact. Postoperative change in the cervical spine. IMPRESSION: Emphysematous changes in the lungs. No evidence of active pulmonary disease. Electronically Signed   By: Burman Nieves M.D.   On: 02/26/2016 03:46    Micro Results    No results found for this or any previous visit (from the past 240 hour(s)).     Today   Subjective:   Tyler Russell today is [facility].  Objective:   Blood pressure 131/80, pulse 70, temperature 98 F (36.7 C), temperature source Oral, resp. rate 14, height  (1.651 m), weight 74.571 kg (164 lb 6.4 oz), SpO2 96 %.   Intake/Output Summary (Last 24 hours) at 02/27/16 1320 Last data filed at 02/26/16 2310  Gross per 24 hour  Intake    868 ml  Output   1600 ml  Net   -732 ml    Exam Awake Alert, Oriented x 3, No new F.N deficits, Normal affect Berkley.AT,PERRAL Supple Neck,No JVD, No cervical lymphadenopathy appriciated.  Symmetrical Chest wall movement, Good air movement bilaterally, CTAB RRR,No Gallops,Rubs or new Murmurs, No Parasternal Heave +ve B.Sounds, Abd Soft, Non tender, No organomegaly appriciated, No rebound -guarding or rigidity. No Cyanosis, Clubbing or edema, No new Rash or bruise  Data Review   CBC w Diff: Lab Results  Component Value Date   WBC 6.3 02/27/2016   WBC 11.3* 03/15/2012   HGB 12.8* 02/27/2016   HGB 10.9* 03/15/2012   HCT 38.3* 02/27/2016   HCT 35.0* 03/15/2012   PLT 308 02/27/2016   PLT 338 03/15/2012   LYMPHOPCT 34 09/03/2011   MONOPCT 11 09/03/2011   EOSPCT 2 09/03/2011   BASOPCT 1 09/03/2011    CMP: Lab Results  Component Value Date   NA 139  02/27/2016   NA 140 03/15/2012   K 4.0 02/27/2016   K 3.2* 03/15/2012   CL 107 02/27/2016   CL 106 03/15/2012   CO2 24 02/27/2016   CO2 23 03/15/2012   BUN 11 02/27/2016   BUN 19* 03/15/2012   CREATININE 0.72 02/27/2016   CREATININE 1.20 03/15/2012   PROT 7.8 02/26/2016   ALBUMIN 4.5 02/26/2016   BILITOT 0.6 02/26/2016   ALKPHOS 74 02/26/2016   AST 33 02/26/2016   ALT 57 02/26/2016  .   Total Time in preparing paper work, data evaluation and todays exam - 35 minutes  Michae Grimley M.D on 02/27/2016 at 1:20 PM    Note: This dictation was prepared with Dragon dictation along with smaller phrase technology. Any transcriptional errors that result from this process are unintentional.

## 2016-02-27 NOTE — Progress Notes (Signed)
ANTICOAGULATION CONSULT NOTE - Follow-up Consult  Pharmacy Consult for Heparin Indication: ACS/NSTEMI - for CABG  Allergies  Allergen Reactions  . Vicodin [Hydrocodone-Acetaminophen] Itching  . Milk-Related Compounds Other (See Comments)    Sneezing and post nasal drip    Patient Measurements: Height: 5\' 5"  (165.1 cm) Weight: 164 lb 6.4 oz (74.571 kg) IBW/kg (Calculated) : 61.5Height: 65 inches  Weight: 164 lb 6.4 oz, 74.6 kg  Ideal body weight: 61.5 kg  Heparin dosing weight:  74.6 kg  Vital Signs: Temp: 97.6 F (36.4 C) (06/14 1937) Temp Source: Oral (06/14 1937) BP: 123/69 mmHg (06/14 2200) Pulse Rate: 69 (06/14 2200)  Labs:  Recent Labs  02/26/16 0318  02/26/16 1454 02/26/16 1614 02/26/16 2252 02/27/16 0351 02/27/16 1607 02/27/16 2238  HGB 13.4  --  12.6*  --   --  12.8*  --   --   HCT 39.0*  --  37.8*  --   --  38.3*  --   --   PLT 320  --  302  --   --  308  --   --   APTT  --   --   --   --   --   --  27  --   LABPROT  --   --   --   --   --  14.2 15.0  --   INR  --   --   --   --   --  1.08 1.16  --   HEPARINUNFRC  --   --   --   --   --   --   --  0.21*  CREATININE 0.81  --   --   --   --  0.72 0.83  --   TROPONINI <0.03  < >  --  0.21* 0.19* 0.17*  --   --   < > = values in this interval not displayed.  Estimated Creatinine Clearance: 90.4 mL/min (by C-G formula based on Cr of 0.83).   Assessment: 60 yr old [facility] after cardiac cath 6/14 which showed 3-vessel CAD, for CABG on 02/29/16. Sheath out at 11:15am today post-cath. Heparin restarted at 900 units/hr - heparin level subtherapeutic. No issues with line or bleeding reported per RN.  Goal of Therapy:  Heparin level 0.3-0.7 units/ml Monitor platelets by anticoagulation protocol: Yes   Plan:  Increase heparin drip to 1050 units/hr.  F/u heparin level 6 hours post gtt change  Christoper Fabianaron Tagen Milby, PharmD, BCPS Clinical pharmacist, pager  [phone] 02/27/2016,11:38 PM

## 2016-02-27 NOTE — Progress Notes (Signed)
Pt clinically stable post heart cath, requiring [facility] , report called to Care nurse with orders and plan of care reviewed. Family aware of pt having icu bed at this time. [facility] here at this time to transport [facility] to [facility] at this time, belongings with family, pt stable prior to transfer, no bleeding nor hematoma to right groin site, pt denies complaints of cp at this time.

## 2016-02-27 NOTE — Progress Notes (Signed)
ANTICOAGULATION CONSULT NOTE - Initial Consult  Pharmacy Consult for Heparin Indication: ACS/NSTEMI - for CABG  Allergies  Allergen Reactions  . Vicodin [Hydrocodone-Acetaminophen] Itching  . Milk-Related Compounds Other (See Comments)    Sneezing and post nasal drip    Patient Measurements:  Height: 65 inches  Weight: 164 lb 6.4 oz, 74.6 kg  Ideal body weight: 61.5 kg  Heparin dosing weight:  74.6 kg  Vital Signs: Temp: 97.8 F (36.6 C) (06/14 1550) Temp Source: Oral (06/14 1550) BP: 125/76 mmHg (06/14 1500) Pulse Rate: 86 (06/14 1500)  Labs:  Recent Labs  02/26/16 0318  02/26/16 1454 02/26/16 1614 02/26/16 2252 02/27/16 0351  HGB 13.4  --  12.6*  --   --  12.8*  HCT 39.0*  --  37.8*  --   --  38.3*  PLT 320  --  302  --   --  308  LABPROT  --   --   --   --   --  14.2  INR  --   --   --   --   --  1.08  CREATININE 0.81  --   --   --   --  0.72  TROPONINI <0.03  < >  --  0.21* 0.19* 0.17*  < > = values in this interval not displayed.  Estimated Creatinine Clearance: 93.8 mL/min (by C-G formula based on Cr of 0.72).   Medical History: Past Medical History  Diagnosis Date  . Hypercholesteremia     no medication at this time  . Chronic kidney disease   . Ulcer     on medication  . Headache(784.0)   . Arthritis   . Complication of anesthesia     had difficulty with breathing, had difficulty waking up  . Trigger finger     on both hands  . Chronic back pain   . Alcohol abuse   . Gonorrhea   . Hypertension    Assessment:   60 yr old [facility] after cardiac cath today.  3-vessel CAD, for CABG on 02/29/16.   [facility], ruled in for NSTEMI.  Received Lovenox 75 mg x 1 on 6/13 at ~3pm, dose held this am for cardiac cath.  Now to begin IV heparin.   Sheath out at 11:15am today post-cath. Groin site noted without bleeding or hematoma.  Goal of Therapy:  Heparin level 0.3-0.7  units/ml Monitor platelets by anticoagulation protocol: Yes   Plan:   Begin heparin drip at 900 units/hr. No bolus post-cath.  Heparin level ~6hrs after drip begins.  Daily heparin level and CBC while on heparin.  Dennie Fettersgan, Caitlynn Ju Donovan, [place] Pager: [phone] 02/27/2016,4:22 PM  He

## 2016-02-27 NOTE — Progress Notes (Signed)
[facility] Encounter Note  Patient: Tyler Russell / Admit Date: 02/26/2016 / Date of Encounter: 02/27/2016, 10:55 AM   Subjective: Mild amount of chest pain overnight. Patient had chest pain with cardiac catheterization. Patient is hemodynamically stable. Significant chest discomfort and pressure with catheterization area Cardiac catheterization shows normal LV systolic function with ejection fraction of 55% and critical left main coronary artery disease with nondominant critical right coronary artery disease  Review of Systems: Positive for: Chest pain Negative for: Vision change, hearing change, syncope, dizziness, nausea, vomiting,diarrhea, bloody stool, stomach pain, cough, congestion, diaphoresis, urinary frequency, urinary pain,skin lesions, skin rashes Others previously listed  Objective: Telemetry: Normal sinus rhythm Physical Exam: Blood pressure 161/89, pulse 77, temperature 98 F (36.7 C), temperature source Oral, resp. rate 12, height  (1.651 m), weight 164 lb 6.4 oz (74.571 kg), SpO2 98 %. Body mass index is 27.36 kg/(m^2). General: Well developed, well nourished, in no acute distress. Head: Normocephalic, atraumatic, sclera non-icteric, no xanthomas, nares are without discharge. Neck: No apparent masses Lungs: Normal respirations with no wheezes, no rhonchi, no rales , no crackles   Heart: Regular rate and rhythm, normal S1 S2, no murmur, no rub, no gallop, PMI is normal size and placement, carotid upstroke normal without bruit, jugular venous pressure normal Abdomen: Soft, non-tender, non-distended with normoactive bowel sounds. No hepatosplenomegaly. Abdominal aorta is normal size without bruit Extremities: No edema, no clubbing, no cyanosis, no ulcers,  Peripheral: 2+ radial, 2+ femoral, 2+ dorsal pedal pulses Neuro: Alert and oriented. Moves all extremities spontaneously. Psych:  Responds to questions appropriately with a normal  affect.   Intake/Output Summary (Last 24 hours) at 02/27/16 1055 Last data filed at 02/26/16 2310  Gross per 24 hour  Intake    868 ml  Output   1600 ml  Net   -732 ml    Inpatient Medications:  . [MAR Hold] amLODipine  5 mg Oral Daily  . aspirin  81 mg Oral Pre-Cath  . [MAR Hold] aspirin EC  81 mg Oral Daily  . [MAR Hold] enoxaparin (LOVENOX) injection  1 mg/kg Subcutaneous Q12H  . [MAR Hold] gemfibrozil  600 mg Oral BID AC  . [MAR Hold] insulin aspart  0-5 Units Subcutaneous QHS  . [MAR Hold] insulin aspart  0-9 Units Subcutaneous TID WC  . [MAR Hold] LORazepam  1 mg Oral QHS  . [MAR Hold] metoprolol tartrate  25 mg Oral BID  . [MAR Hold] pantoprazole  40 mg Oral BID AC  . sodium chloride flush  3 mL Intravenous Q12H   Infusions:  . sodium chloride 60 mL/hr at 02/26/16 1657  . sodium chloride Stopped (02/27/16 0900)    Labs:  Recent Labs  02/26/16 0318 02/27/16 0351  NA 136 139  K 3.7 4.0  CL 104 107  CO2 25 24  GLUCOSE 230* 138*  BUN 13 11  CREATININE 0.81 0.72  CALCIUM 9.4 8.9    Recent Labs  02/26/16 0318  AST 33  ALT 57  ALKPHOS 74  BILITOT 0.6  PROT 7.8  ALBUMIN 4.5    Recent Labs  02/26/16 1454 02/27/16 0351  WBC 6.2 6.3  HGB 12.6* 12.8*  HCT 37.8* 38.3*  MCV 79.9* 80.3  PLT 302 308    Recent Labs  02/26/16 0706 02/26/16 1614 02/26/16 2252 02/27/16 0351  TROPONINI 0.08* 0.21* 0.19* 0.17*   Invalid input(s): POCBNP  Recent Labs  02/26/16 1454  HGBA1C 8.6*     Weights: [employer]  Weights   02/26/16 0314 02/26/16 1556 02/26/16 2248  Weight: 170 lb (77.111 kg) 169 lb 11.2 oz (76.975 kg) 164 lb 6.4 oz (74.571 kg)     Radiology/Studies:  Dg Chest 2 View  02/26/2016  CLINICAL DATA:  Chest pain. Patient ate a large meal which aggravated his stomach ulcer. EXAM: CHEST  2 VIEW COMPARISON:  01/27/2011 FINDINGS: Emphysematous changes in the lungs. Normal heart size and pulmonary vascularity. No focal airspace disease or consolidation  in the lungs. No blunting of costophrenic angles. No pneumothorax. Mediastinal contours appear intact. Postoperative change in the cervical spine. IMPRESSION: Emphysematous changes in the lungs. No evidence of active pulmonary disease. Electronically Signed   By: Burman NievesWilliam  Stevens M.D.   On: 02/26/2016 03:46     Assessment and Recommendation  60 y.o. male with the mixed hyperlipidemia essential hypertension with progressive unstable angina and minimal elevation of troponin consistent with non-ST elevation myocardial infarction and critical left main coronary artery disease by cardiac catheterization 1. Continue nitroglycerin paste with beta blocker for risk reduction of further chest discomfort 2. Re-heparinize for risk reduction of further cardiac and/or myocardial infarction 3. Proceed to coronary artery bypass surgery when able  Signed, Arnoldo HookerBruce Kowalski M.D. FACC

## 2016-02-28 ENCOUNTER — Encounter (HOSPITAL_COMMUNITY): Payer: Self-pay | Admitting: Certified Registered Nurse Anesthetist

## 2016-02-28 ENCOUNTER — Inpatient Hospital Stay (HOSPITAL_COMMUNITY): Payer: Medicare Other

## 2016-02-28 DIAGNOSIS — I2510 Atherosclerotic heart disease of native coronary artery without angina pectoris: Secondary | ICD-10-CM

## 2016-02-28 LAB — PULMONARY FUNCTION TEST
FEF 25-75 PRE: 3.65 L/s
FEF 25-75 Post: 4.14 L/sec
FEF2575-%Change-Post: 13 %
FEF2575-%PRED-PRE: 144 %
FEF2575-%Pred-Post: 163 %
FEV1-%Change-Post: 4 %
FEV1-%PRED-POST: 111 %
FEV1-%Pred-Pre: 106 %
FEV1-POST: 3.34 L
FEV1-PRE: 3.21 L
FEV1FVC-%CHANGE-POST: 4 %
FEV1FVC-%Pred-Pre: 110 %
FEV6-%CHANGE-POST: 0 %
FEV6-%PRED-POST: 100 %
FEV6-%PRED-PRE: 101 %
FEV6-PRE: 3.81 L
FEV6-Post: 3.81 L
FEV6FVC-%CHANGE-POST: 0 %
FEV6FVC-%PRED-PRE: 105 %
FEV6FVC-%Pred-Post: 105 %
FVC-%CHANGE-POST: 0 %
FVC-%Pred-Post: 96 %
FVC-%Pred-Pre: 96 %
FVC-Post: 3.81 L
FVC-Pre: 3.81 L
POST FEV1/FVC RATIO: 88 %
POST FEV6/FVC RATIO: 100 %
PRE FEV1/FVC RATIO: 84 %
Pre FEV6/FVC Ratio: 100 %

## 2016-02-28 LAB — HEMOGLOBIN A1C
HEMOGLOBIN A1C: 8.7 % — AB (ref 4.8–5.6)
MEAN PLASMA GLUCOSE: 203 mg/dL

## 2016-02-28 LAB — BASIC METABOLIC PANEL
Anion gap: 7 (ref 5–15)
BUN: 11 mg/dL (ref 6–20)
CALCIUM: 9.1 mg/dL (ref 8.9–10.3)
CO2: 26 mmol/L (ref 22–32)
CREATININE: 0.92 mg/dL (ref 0.61–1.24)
Chloride: 105 mmol/L (ref 101–111)
GFR calc non Af Amer: >60 mL/min (ref >60)
Glucose, Bld: 158 mg/dL — ABNORMAL HIGH (ref 65–99)
Potassium: 4.2 mmol/L (ref 3.5–5.1)
SODIUM: 138 mmol/L (ref 135–145)

## 2016-02-28 LAB — CBC
HCT: 39.1 % (ref 39.0–52.0)
Hemoglobin: 12.4 g/dL — ABNORMAL LOW (ref 13.0–17.0)
MCH: 25.8 pg — AB (ref 26.0–34.0)
MCHC: 31.7 g/dL (ref 30.0–36.0)
MCV: 81.5 fL (ref 78.0–100.0)
PLATELETS: 282 10*3/uL (ref 150–400)
RBC: 4.8 MIL/uL (ref 4.22–5.81)
RDW: 13.6 % (ref 11.5–15.5)
WBC: 7.6 10*3/uL (ref 4.0–10.5)

## 2016-02-28 LAB — URINALYSIS, ROUTINE W REFLEX MICROSCOPIC
BILIRUBIN URINE: NEGATIVE
GLUCOSE, UA: NEGATIVE mg/dL
HGB URINE DIPSTICK: NEGATIVE
Ketones, ur: NEGATIVE mg/dL
Leukocytes, UA: NEGATIVE
Nitrite: NEGATIVE
PH: 6.5 (ref 5.0–8.0)
Protein, ur: NEGATIVE mg/dL
SPECIFIC GRAVITY, URINE: 1.01 (ref 1.005–1.030)

## 2016-02-28 LAB — GLUCOSE, CAPILLARY
GLUCOSE-CAPILLARY: 153 mg/dL — AB (ref 65–99)
Glucose-Capillary: 130 mg/dL — ABNORMAL HIGH (ref 65–99)
Glucose-Capillary: 107 mg/dL — ABNORMAL HIGH (ref 65–99)

## 2016-02-28 LAB — HEPARIN LEVEL (UNFRACTIONATED)
HEPARIN UNFRACTIONATED: 0.64 [IU]/mL (ref 0.30–0.70)
HEPARIN UNFRACTIONATED: 0.65 [IU]/mL (ref 0.30–0.70)

## 2016-02-28 LAB — TYPE AND SCREEN
ABO/RH(D): A POS
Antibody Screen: NEGATIVE

## 2016-02-28 MED ORDER — PLASMA-LYTE 148 IV SOLN
INTRAVENOUS | Status: AC
  Administered 2016-02-29: 500 mL
  Filled 2016-02-28: qty 2.5

## 2016-02-28 MED ORDER — TEMAZEPAM 7.5 MG PO CAPS: 15.0000 mg | ORAL_CAPSULE | Freq: Once | ORAL | Status: DC | PRN

## 2016-02-28 MED ORDER — ALBUTEROL SULFATE (2.5 MG/3ML) 0.083% IN NEBU
2.5000 mg | INHALATION_SOLUTION | Freq: Once | RESPIRATORY_TRACT | Status: AC
  Administered 2016-02-28: 2.5 mg via RESPIRATORY_TRACT

## 2016-02-28 MED ORDER — SODIUM CHLORIDE 0.9 % IV SOLN
INTRAVENOUS | Status: AC
  Administered 2016-02-29: 1.1 [IU]/h via INTRAVENOUS
  Filled 2016-02-28: qty 2.5

## 2016-02-28 MED ORDER — BISACODYL 5 MG PO TBEC: 5.0000 mg | DELAYED_RELEASE_TABLET | Freq: Once | ORAL | Status: DC

## 2016-02-28 MED ORDER — SODIUM CHLORIDE 0.9 % IV SOLN
INTRAVENOUS | Status: AC
  Administered 2016-02-29: 69.8 mL/h via INTRAVENOUS
  Filled 2016-02-28: qty 40

## 2016-02-28 MED ORDER — CHLORHEXIDINE GLUCONATE CLOTH 2 % EX PADS
6.0000 | MEDICATED_PAD | Freq: Once | CUTANEOUS | Status: AC
  Administered 2016-02-28: 6 via TOPICAL

## 2016-02-28 MED ORDER — INSULIN ASPART 100 UNIT/ML ~~LOC~~ SOLN
0.0000 [IU] | SUBCUTANEOUS | Status: DC
Start: 2016-02-28 — End: 2016-02-29
  Administered 2016-02-28: 3 [IU] via SUBCUTANEOUS
  Administered 2016-02-28 – 2016-02-29 (×3): 2 [IU] via SUBCUTANEOUS

## 2016-02-28 MED ORDER — POTASSIUM CHLORIDE 2 MEQ/ML IV SOLN
80.0000 meq | INTRAVENOUS | Status: DC
  Filled 2016-02-28: qty 40

## 2016-02-28 MED ORDER — CEFUROXIME SODIUM 750 MG IJ SOLR
750.0000 mg | INTRAMUSCULAR | Status: DC
  Filled 2016-02-28: qty 750

## 2016-02-28 MED ORDER — EPINEPHRINE HCL 1 MG/ML IJ SOLN
0.0000 ug/min | INTRAMUSCULAR | Status: DC
  Filled 2016-02-28: qty 4

## 2016-02-28 MED ORDER — DEXMEDETOMIDINE HCL IN NACL 400 MCG/100ML IV SOLN
0.1000 ug/kg/h | INTRAVENOUS | Status: AC
Start: 2016-02-29 — End: 2016-02-29
  Administered 2016-02-29: .3 ug/kg/h via INTRAVENOUS
  Filled 2016-02-28: qty 100

## 2016-02-28 MED ORDER — DEXTROSE 5 % IV SOLN
1.5000 g | INTRAVENOUS | Status: AC
  Administered 2016-02-29: 1.5 g via INTRAVENOUS
  Administered 2016-02-29: .75 g via INTRAVENOUS
  Filled 2016-02-28: qty 1.5

## 2016-02-28 MED ORDER — VANCOMYCIN HCL 10 G IV SOLR
1250.0000 mg | INTRAVENOUS | Status: AC
  Administered 2016-02-29: 1250 mg via INTRAVENOUS
  Filled 2016-02-28: qty 1250

## 2016-02-28 MED ORDER — CHLORHEXIDINE GLUCONATE 0.12 % MT SOLN
15.0000 mL | Freq: Once | OROMUCOSAL | Status: AC
  Administered 2016-02-29: 15 mL via OROMUCOSAL
  Filled 2016-02-28: qty 15

## 2016-02-28 MED ORDER — DEXTROSE 5 % IV SOLN
30.0000 ug/min | INTRAVENOUS | Status: AC
  Administered 2016-02-29: 40 ug/min via INTRAVENOUS
  Filled 2016-02-28: qty 2

## 2016-02-28 MED ORDER — DIAZEPAM 5 MG PO TABS
5.0000 mg | ORAL_TABLET | Freq: Once | ORAL | Status: AC
  Administered 2016-02-29: 5 mg via ORAL
  Filled 2016-02-28: qty 1

## 2016-02-28 MED ORDER — ~~LOC~~ CARDIAC SURGERY, PATIENT & FAMILY EDUCATION
Freq: Once | Status: AC
  Administered 2016-02-28: 1
  Filled 2016-02-28: qty 1

## 2016-02-28 MED ORDER — METOPROLOL TARTRATE 12.5 MG HALF TABLET
12.5000 mg | ORAL_TABLET | Freq: Once | ORAL | Status: AC
  Administered 2016-02-29: 12.5 mg via ORAL
  Filled 2016-02-28: qty 1

## 2016-02-28 MED ORDER — HEPARIN SODIUM (PORCINE) 1000 UNIT/ML IJ SOLN
INTRAMUSCULAR | Status: DC
  Filled 2016-02-28: qty 30

## 2016-02-28 MED ORDER — DOPAMINE-DEXTROSE 3.2-5 MG/ML-% IV SOLN
0.0000 ug/kg/min | INTRAVENOUS | Status: DC
  Filled 2016-02-28: qty 250

## 2016-02-28 MED ORDER — NITROGLYCERIN IN D5W 200-5 MCG/ML-% IV SOLN
2.0000 ug/min | INTRAVENOUS | Status: DC
  Filled 2016-02-28: qty 250

## 2016-02-28 MED ORDER — MAGNESIUM SULFATE 50 % IJ SOLN
40.0000 meq | INTRAMUSCULAR | Status: DC
  Filled 2016-02-28: qty 10

## 2016-02-28 NOTE — Progress Notes (Signed)
Inpatient Diabetes Program Recommendations  AACE/ADA: New Consensus Statement on Inpatient Glycemic Control (2015)  Target Ranges:  Prepandial:   less than 140 mg/dL      Peak postprandial:   less than 180 mg/dL (1-2 hours)      Critically ill patients:  140 - 180 mg/dL  Results for Tyler KindredCOLEY, Darrek L (MRN [id number]) as of 02/28/2016 09:26  Ref. Range 02/27/2016 16:07 02/28/2016 05:35  Hemoglobin A1C Latest Ref Range: 4.8-5.6 % 8.7 (H)   Glucose Latest Ref Range: 65-99 mg/dL 409138 (H) 811158 (H)    Review of Glycemic Control  Diabetes history: No Outpatient Diabetes medications: NA Current orders for Inpatient glycemic control: None  Inpatient Diabetes Program Recommendations: Correction (SSI): Please consider ordering CBGs with Novolog correction scale. HgbA1C: Noted A1C 8.7% on 02/27/16 which meets ADA criteria for DM dx. If patient will be dx with DM, please inform patient and nursing staff so that patient can be educated by bedside nursing.  Thanks, Orlando PennerMarie Argus Caraher, RN, MSN, CDE Diabetes Coordinator Inpatient Diabetes Program [phone] (Team Pager from 8am to 5pm) [phone] ([facility] office) [phone] [facility] office) [phone] [facility] office)

## 2016-02-28 NOTE — Progress Notes (Signed)
Pre-op Cardiac Surgery  Carotid Findings:  Findings consistent with 1- 39 percent stenosis involving the right internal carotid artery and the left internal carotid artery.   Upper Extremity Right Left  Brachial Pressures 162 Triphasic 162 Triphasic  Radial Waveforms  Triphasic  Triphasic  Ulnar Waveforms  Triphasic  Triphasic  Palmar Arch (Allen's Test) normal Normal   Findings:   Palmer Arch evaluation-Doppler waveforms remained normal with both radial and ulnar artery compression at rest bilaterally.   Lower  Extremity Right Left  Anterior Tibial 179 Triphasic 159 Triphasic  Posterior Tibial 170 Triphasic 167 Triphasic  Ankle/Brachial Indices 1.10 1.03    Findings:   ABI's and Doppler waveforms bilaterally are with normal limits at rest.

## 2016-02-28 NOTE — Progress Notes (Signed)
Procedure(s) (LRB): CORONARY ARTERY BYPASS GRAFTING (CABG) (N/A) TRANSESOPHAGEAL ECHOCARDIOGRAM (TEE) (N/A) Subjective:  No chest pain or shortness of breath overnight  Objective: Vital signs in last 24 hours: Temp:  [97.5 F (36.4 C)-98 F (36.7 C)] 97.5 F (36.4 C) (06/15 0742) Pulse Rate:  [66-86] 75 (06/15 0700) Cardiac Rhythm:  [-] Normal sinus rhythm (06/14 2000) Resp:  [12-23] 15 (06/15 0700) BP: (112-161)/(62-94) 123/80 mmHg (06/15 0700) SpO2:  [95 %-100 %] 100 % (06/15 0700) Weight:  [74.571 kg (164 lb 6.4 oz)-75.1 kg (165 lb 9.1 oz)] 75.1 kg (165 lb 9.1 oz) (06/15 0600)  Hemodynamic parameters for last 24 hours:    Intake/Output from previous day: 06/14 0701 - 06/15 0700 In: 552.8 [P.O.:240; I.V.:312.8] Out: 2300 [Urine:2300] Intake/Output this shift: Total I/O In: -  Out: 250 [Urine:250]  General appearance: alert and cooperative Heart: regular rate and rhythm, S1, S2 normal, no murmur, click, rub or gallop Lungs: clear to auscultation bilaterally  Lab Results:  Recent Labs  02/27/16 0351 02/28/16 0535  WBC 6.3 7.6  HGB 12.8* 12.4*  HCT 38.3* 39.1  PLT 308 282   BMET:  Recent Labs  02/27/16 1607 02/28/16 0535  NA 138 138  K 4.0 4.2  CL 106 105  CO2 26 26  GLUCOSE 138* 158*  BUN 10 11  CREATININE 0.83 0.92  CALCIUM 9.0 9.1    PT/INR:  Recent Labs  02/27/16 1607  LABPROT 15.0  INR 1.16   ABG    Component Value Date/Time   TCO2 27 09/03/2011 1359   CBG (last 3)   Recent Labs  02/26/16 1644 02/26/16 2122 02/27/16 0824  GLUCAP 275* 152* 142*    Assessment/Plan:  Severe multi-vessel coronary artery disease s/p NSTEMI. He is stable on heparin and NTG. Plan CABG in the am.  LOS: 1 day    Alleen BorneBryan K Anshika Pethtel 02/28/2016

## 2016-02-28 NOTE — Care Management Note (Signed)
Case Management Note  Patient Details  Name: Tyler Russell MRN: [id number] Date of Birth: [birth year]  Subjective/Objective:  Pt admitted with STEMI - planned CABG 02/29/16                  Action/Plan:  PTA independent from home with wife.  Wife will be with pt post discharge for recommended time.  Wife raised concerns with pt discharging home; wife has multiple health concerns as well however completely functional ; CM assured wife that pt will be assessed prior to discharge for an needs and that there recommendation for 24/7 supervision is only to be able to call for help if needed.  CM will continue to monitor for disposition needs    Expected Discharge Date:                  Expected Discharge Plan:  Home/Self Care  In-House Referral:     Discharge planning Services  CM Consult  Post Acute Care Choice:    Choice offered to:     DME Arranged:    DME Agency:     HH Arranged:    HH Agency:     Status of Service:  In process, will continue to follow  Medicare Important Message Given:    Date Medicare IM Given:    Medicare IM give by:    Date Additional Medicare IM Given:    Additional Medicare Important Message give by:     If discussed at Long Length of Stay Meetings, dates discussed:    Additional Comments:  Cherylann ParrClaxton, Alyss Granato S, RN 02/28/2016, 11:31 AM

## 2016-02-28 NOTE — Progress Notes (Signed)
ANTICOAGULATION CONSULT NOTE - Follow-up Consult  Pharmacy Consult for Heparin Indication: ACS/NSTEMI - for CABG  Allergies  Allergen Reactions  . Vicodin [Hydrocodone-Acetaminophen] Itching  . Milk-Related Compounds Other (See Comments)    Sneezing and post nasal drip    Patient Measurements: Height: 5\' 5"  (165.1 cm) Weight: 165 lb 9.1 oz (75.1 kg) IBW/kg (Calculated) : 61.5Height: 65 inches  Weight: 164 lb 6.4 oz, 74.6 kg  Ideal body weight: 61.5 kg  Heparin dosing weight:  74.6 kg  Vital Signs: Temp: 97.8 F (36.6 C) (06/15 0404) Temp Source: Oral (06/15 0404) BP: 130/76 mmHg (06/15 0600) Pulse Rate: 70 (06/15 0600)  Labs:  Recent Labs  02/26/16 1454 02/26/16 1614 02/26/16 2252 02/27/16 0351 02/27/16 1607 02/27/16 2238 02/28/16 0535  HGB 12.6*  --   --  12.8*  --   --  12.4*  HCT 37.8*  --   --  38.3*  --   --  39.1  PLT 302  --   --  308  --   --  282  APTT  --   --   --   --  27  --   --   LABPROT  --   --   --  14.2 15.0  --   --   INR  --   --   --  1.08 1.16  --   --   HEPARINUNFRC  --   --   --   --   --  0.21* 0.64  CREATININE  --   --   --  0.72 0.83  --  0.92  TROPONINI  --  0.21* 0.19* 0.17*  --   --   --     Estimated Creatinine Clearance: 81.8 mL/min (by C-G formula based on Cr of 0.92).   Assessment: 60 yr [facility] cardiac cath 6/14 which showed 3-vessel CAD, for CABG on 02/29/16. Heparin level therapeutic on 1050 units/hr. CBC stable.  Goal of Therapy:  Heparin level 0.3-0.7 units/ml Monitor platelets by anticoagulation protocol: Yes   Plan:  Continue heparin drip at 1050 units/hr.  Will f/u 6hr heparin level to confirm therapeutic  Christoper Fabianaron Charlott Calvario, PharmD, BCPS Clinical pharmacist, pager [phone] 02/28/2016,7:02 AM

## 2016-02-28 NOTE — Progress Notes (Signed)
ANTICOAGULATION CONSULT NOTE - Follow-up Consult  Pharmacy Consult for Heparin Indication: ACS/NSTEMI - for CABG  Allergies  Allergen Reactions  . Vicodin [Hydrocodone-Acetaminophen] Itching  . Milk-Related Compounds Other (See Comments)    Sneezing and post nasal drip    Patient Measurements: Height: 5\' 5"  (165.1 cm) Weight: 165 lb 9.1 oz (75.1 kg) IBW/kg (Calculated) : 61.5Height: 65 inches  Weight: 164 lb 6.4 oz, 74.6 kg  Ideal body weight: 61.5 kg  Heparin dosing weight:  74.6 kg  Vital Signs: Temp: 97.5 F (36.4 C) (06/15 0742) Temp Source: Oral (06/15 0742) BP: 124/81 mmHg (06/15 1400) Pulse Rate: 70 (06/15 1400)  Labs:  Recent Labs  02/26/16 1454 02/26/16 1614 02/26/16 2252 02/27/16 0351 02/27/16 1607 02/27/16 2238 02/28/16 0535 02/28/16 1417  HGB 12.6*  --   --  12.8*  --   --  12.4*  --   HCT 37.8*  --   --  38.3*  --   --  39.1  --   PLT 302  --   --  308  --   --  282  --   APTT  --   --   --   --  27  --   --   --   LABPROT  --   --   --  14.2 15.0  --   --   --   INR  --   --   --  1.08 1.16  --   --   --   HEPARINUNFRC  --   --   --   --   --  0.21* 0.64 0.65  CREATININE  --   --   --  0.72 0.83  --  0.92  --   TROPONINI  --  0.21* 0.19* 0.17*  --   --   --   --     Estimated Creatinine Clearance: 81.8 mL/min (by C-G formula based on Cr of 0.92).   Assessment: 60 yr [facility] after cardiac cath 6/14 which showed 3-vessel CAD, for CABG on 02/29/16. Heparin levels 0.64 and now 0.65 remain in goal range.  Goal of Therapy:  Heparin level 0.3-0.7 units/ml Monitor platelets by anticoagulation protocol: Yes   Plan:  Continue heparin drip at 1050 units/hr.  CABG tomorrow.   Param Capri S. Merilynn Finlandobertson, PharmD, [facility] Clinical Staff Pharmacist Pager [phone]   02/28/2016,3:32 PM

## 2016-02-29 ENCOUNTER — Inpatient Hospital Stay (HOSPITAL_COMMUNITY): Payer: Medicare Other | Admitting: Certified Registered Nurse Anesthetist

## 2016-02-29 ENCOUNTER — Inpatient Hospital Stay (HOSPITAL_COMMUNITY): Payer: Medicare Other

## 2016-02-29 ENCOUNTER — Encounter (HOSPITAL_COMMUNITY)
Admission: AD | Disposition: A | Discharge status: Home Health | Payer: Self-pay | Source: Other Acute Inpatient Hospital | Attending: Surgery

## 2016-02-29 DIAGNOSIS — Z951 Presence of aortocoronary bypass graft: Secondary | ICD-10-CM

## 2016-02-29 HISTORY — PX: CORONARY ARTERY BYPASS GRAFT: SHX141

## 2016-02-29 HISTORY — PX: TEE WITHOUT CARDIOVERSION: SHX5443

## 2016-02-29 LAB — GLUCOSE, CAPILLARY
GLUCOSE-CAPILLARY: 99 mg/dL (ref 65–99)
Glucose-Capillary: 143 mg/dL — ABNORMAL HIGH (ref 65–99)
Glucose-Capillary: 134 mg/dL — ABNORMAL HIGH (ref 65–99)

## 2016-02-29 LAB — BLOOD GAS, ARTERIAL
Acid-base deficit: 1.7 mmol/L (ref 0.0–2.0)
Bicarbonate: 22 mEq/L (ref 20.0–24.0)
DRAWN BY: 41875
FIO2: 0.21
O2 Saturation: 97.8 %
PH ART: 7.428 (ref 7.350–7.450)
Patient temperature: 98.6
TCO2: 23.1 mmol/L (ref 0–100)
pCO2 arterial: 34 mmHg — ABNORMAL LOW (ref 35.0–45.0)
pO2, Arterial: 108 mmHg — ABNORMAL HIGH (ref 80.0–100.0)

## 2016-02-29 LAB — COMPREHENSIVE METABOLIC PANEL
ALBUMIN: 3.9 g/dL (ref 3.5–5.0)
ALK PHOS: 74 U/L (ref 38–126)
ALT: 62 U/L (ref 17–63)
ANION GAP: 9 (ref 5–15)
AST: 36 U/L (ref 15–41)
BUN: 12 mg/dL (ref 6–20)
CALCIUM: 9.2 mg/dL (ref 8.9–10.3)
CHLORIDE: 105 mmol/L (ref 101–111)
CO2: 24 mmol/L (ref 22–32)
Creatinine, Ser: 0.92 mg/dL (ref 0.61–1.24)
GFR calc Af Amer: >60 mL/min (ref >60)
GFR calc non Af Amer: >60 mL/min (ref >60)
GLUCOSE: 132 mg/dL — AB (ref 65–99)
Potassium: 3.9 mmol/L (ref 3.5–5.1)
SODIUM: 138 mmol/L (ref 135–145)
Total Bilirubin: 0.6 mg/dL (ref 0.3–1.2)
Total Protein: 6.6 g/dL (ref 6.5–8.1)

## 2016-02-29 LAB — CBC
HCT: 39.6 % (ref 39.0–52.0)
HCT: 30.2 % — ABNORMAL LOW (ref 39.0–52.0)
HEMATOCRIT: 30.5 % — AB (ref 39.0–52.0)
HEMOGLOBIN: 12.7 g/dL — AB (ref 13.0–17.0)
HEMOGLOBIN: 9.9 g/dL — AB (ref 13.0–17.0)
Hemoglobin: 10 g/dL — ABNORMAL LOW (ref 13.0–17.0)
MCH: 26.1 pg (ref 26.0–34.0)
MCH: 26.2 pg (ref 26.0–34.0)
MCH: 26.1 pg (ref 26.0–34.0)
MCHC: 32.1 g/dL (ref 30.0–36.0)
MCHC: 33.1 g/dL (ref 30.0–36.0)
MCHC: 32.5 g/dL (ref 30.0–36.0)
MCV: 81.5 fL (ref 78.0–100.0)
MCV: 79.3 fL (ref 78.0–100.0)
MCV: 80.3 fL (ref 78.0–100.0)
PLATELETS: 186 10*3/uL (ref 150–400)
Platelets: 279 10*3/uL (ref 150–400)
Platelets: 198 10*3/uL (ref 150–400)
RBC: 4.86 MIL/uL (ref 4.22–5.81)
RBC: 3.81 MIL/uL — AB (ref 4.22–5.81)
RBC: 3.8 MIL/uL — ABNORMAL LOW (ref 4.22–5.81)
RDW: 13.7 % (ref 11.5–15.5)
RDW: 13.5 % (ref 11.5–15.5)
RDW: 13.5 % (ref 11.5–15.5)
WBC: 7.3 10*3/uL (ref 4.0–10.5)
WBC: 8.9 10*3/uL (ref 4.0–10.5)
WBC: 11.7 10*3/uL — AB (ref 4.0–10.5)

## 2016-02-29 LAB — POCT I-STAT, CHEM 8
BUN: 14 mg/dL (ref 6–20)
BUN: 14 mg/dL (ref 6–20)
BUN: 14 mg/dL (ref 6–20)
BUN: 12 mg/dL (ref 6–20)
BUN: 12 mg/dL (ref 6–20)
BUN: 12 mg/dL (ref 6–20)
CALCIUM ION: 1.19 mmol/L (ref 1.12–1.23)
CALCIUM ION: 1.2 mmol/L (ref 1.12–1.23)
CALCIUM ION: 1.1 mmol/L — AB (ref 1.12–1.23)
CHLORIDE: 106 mmol/L (ref 101–111)
CHLORIDE: 105 mmol/L (ref 101–111)
CREATININE: 0.5 mg/dL — AB (ref 0.61–1.24)
Calcium, Ion: 1.02 mmol/L — ABNORMAL LOW (ref 1.12–1.23)
Calcium, Ion: 1.04 mmol/L — ABNORMAL LOW (ref 1.12–1.23)
Calcium, Ion: 1.05 mmol/L — ABNORMAL LOW (ref 1.12–1.23)
Chloride: 105 mmol/L (ref 101–111)
Chloride: 104 mmol/L (ref 101–111)
Chloride: 105 mmol/L (ref 101–111)
Chloride: 103 mmol/L (ref 101–111)
Creatinine, Ser: 0.5 mg/dL — ABNORMAL LOW (ref 0.61–1.24)
Creatinine, Ser: 0.5 mg/dL — ABNORMAL LOW (ref 0.61–1.24)
Creatinine, Ser: 0.5 mg/dL — ABNORMAL LOW (ref 0.61–1.24)
Creatinine, Ser: 0.6 mg/dL — ABNORMAL LOW (ref 0.61–1.24)
Creatinine, Ser: 0.7 mg/dL (ref 0.61–1.24)
GLUCOSE: 113 mg/dL — AB (ref 65–99)
GLUCOSE: 152 mg/dL — AB (ref 65–99)
Glucose, Bld: 106 mg/dL — ABNORMAL HIGH (ref 65–99)
Glucose, Bld: 118 mg/dL — ABNORMAL HIGH (ref 65–99)
Glucose, Bld: 129 mg/dL — ABNORMAL HIGH (ref 65–99)
Glucose, Bld: 157 mg/dL — ABNORMAL HIGH (ref 65–99)
HCT: 37 % — ABNORMAL LOW (ref 39.0–52.0)
HCT: 33 % — ABNORMAL LOW (ref 39.0–52.0)
HCT: 30 % — ABNORMAL LOW (ref 39.0–52.0)
HEMATOCRIT: 25 % — AB (ref 39.0–52.0)
HEMATOCRIT: 26 % — AB (ref 39.0–52.0)
HEMATOCRIT: 28 % — AB (ref 39.0–52.0)
HEMOGLOBIN: 12.6 g/dL — AB (ref 13.0–17.0)
HEMOGLOBIN: 8.5 g/dL — AB (ref 13.0–17.0)
HEMOGLOBIN: 9.5 g/dL — AB (ref 13.0–17.0)
Hemoglobin: 11.2 g/dL — ABNORMAL LOW (ref 13.0–17.0)
Hemoglobin: 8.8 g/dL — ABNORMAL LOW (ref 13.0–17.0)
Hemoglobin: 10.2 g/dL — ABNORMAL LOW (ref 13.0–17.0)
POTASSIUM: 4.4 mmol/L (ref 3.5–5.1)
Potassium: 4.3 mmol/L (ref 3.5–5.1)
Potassium: 4 mmol/L (ref 3.5–5.1)
Potassium: 4.7 mmol/L (ref 3.5–5.1)
Potassium: 5.6 mmol/L — ABNORMAL HIGH (ref 3.5–5.1)
Potassium: 3.9 mmol/L (ref 3.5–5.1)
SODIUM: 138 mmol/L (ref 135–145)
SODIUM: 139 mmol/L (ref 135–145)
SODIUM: 136 mmol/L (ref 135–145)
SODIUM: 139 mmol/L (ref 135–145)
SODIUM: 141 mmol/L (ref 135–145)
Sodium: 138 mmol/L (ref 135–145)
TCO2: 28 mmol/L (ref 0–100)
TCO2: 26 mmol/L (ref 0–100)
TCO2: 29 mmol/L (ref 0–100)
TCO2: 27 mmol/L (ref 0–100)
TCO2: 27 mmol/L (ref 0–100)
TCO2: 23 mmol/L (ref 0–100)

## 2016-02-29 LAB — POCT I-STAT 4, (NA,K, GLUC, HGB,HCT)
Glucose, Bld: 113 mg/dL — ABNORMAL HIGH (ref 65–99)
HCT: 31 % — ABNORMAL LOW (ref 39.0–52.0)
Hemoglobin: 10.5 g/dL — ABNORMAL LOW (ref 13.0–17.0)
Potassium: 3.4 mmol/L — ABNORMAL LOW (ref 3.5–5.1)
Sodium: 142 mmol/L (ref 135–145)

## 2016-02-29 LAB — POCT I-STAT 3, ART BLOOD GAS (G3+)
ACID-BASE DEFICIT: 2 mmol/L (ref 0.0–2.0)
ACID-BASE DEFICIT: 5 mmol/L — AB (ref 0.0–2.0)
ACID-BASE DEFICIT: 6 mmol/L — AB (ref 0.0–2.0)
ACID-BASE EXCESS: 2 mmol/L (ref 0.0–2.0)
BICARBONATE: 25.3 meq/L — AB (ref 20.0–24.0)
BICARBONATE: 22.7 meq/L (ref 20.0–24.0)
BICARBONATE: 19.9 meq/L — AB (ref 20.0–24.0)
Bicarbonate: 26.8 mEq/L — ABNORMAL HIGH (ref 20.0–24.0)
Bicarbonate: 20.8 mEq/L (ref 20.0–24.0)
O2 Saturation: 100 %
O2 Saturation: 100 %
O2 Saturation: 97 %
O2 Saturation: 98 %
O2 Saturation: 98 %
PCO2 ART: 44.4 mmHg (ref 35.0–45.0)
PCO2 ART: 42.9 mmHg (ref 35.0–45.0)
PH ART: 7.312 — AB (ref 7.350–7.450)
PH ART: 7.328 — AB (ref 7.350–7.450)
PO2 ART: 464 mmHg — AB (ref 80.0–100.0)
PO2 ART: 432 mmHg — AB (ref 80.0–100.0)
TCO2: 28 mmol/L (ref 0–100)
TCO2: 27 mmol/L (ref 0–100)
TCO2: 24 mmol/L (ref 0–100)
TCO2: 22 mmol/L (ref 0–100)
TCO2: 21 mmol/L (ref 0–100)
pCO2 arterial: 34.2 mmHg — ABNORMAL LOW (ref 35.0–45.0)
pCO2 arterial: 40.7 mmHg (ref 35.0–45.0)
pCO2 arterial: 37.8 mmHg (ref 35.0–45.0)
pH, Arterial: 7.389 (ref 7.350–7.450)
pH, Arterial: 7.379 (ref 7.350–7.450)
pH, Arterial: 7.424 (ref 7.350–7.450)
pO2, Arterial: 82 mmHg (ref 80.0–100.0)
pO2, Arterial: 120 mmHg — ABNORMAL HIGH (ref 80.0–100.0)
pO2, Arterial: 105 mmHg — ABNORMAL HIGH (ref 80.0–100.0)

## 2016-02-29 LAB — MAGNESIUM: Magnesium: 2.8 mg/dL — ABNORMAL HIGH (ref 1.7–2.4)

## 2016-02-29 LAB — PROTIME-INR
INR: 1.08 (ref 0.00–1.49)
INR: 1.37 (ref 0.00–1.49)
PROTHROMBIN TIME: 17 s — AB (ref 11.6–15.2)
Prothrombin Time: 14.2 seconds (ref 11.6–15.2)

## 2016-02-29 LAB — HEMOGLOBIN AND HEMATOCRIT, BLOOD
HEMATOCRIT: 27 % — AB (ref 39.0–52.0)
HEMOGLOBIN: 9.1 g/dL — AB (ref 13.0–17.0)

## 2016-02-29 LAB — CREATININE, SERUM: CREATININE: 0.76 mg/dL (ref 0.61–1.24)

## 2016-02-29 LAB — APTT
APTT: 33 s (ref 24–37)
aPTT: 115 seconds — ABNORMAL HIGH (ref 24–37)

## 2016-02-29 LAB — PLATELET COUNT: Platelets: 232 10*3/uL (ref 150–400)

## 2016-02-29 SURGERY — CORONARY ARTERY BYPASS GRAFTING (CABG)
Anesthesia: General | Site: Chest

## 2016-02-29 MED ORDER — DOCUSATE SODIUM 100 MG PO CAPS
200.0000 mg | ORAL_CAPSULE | Freq: Every day | ORAL | Status: DC
Start: 2016-03-01 — End: 2016-03-02
  Administered 2016-03-01 – 2016-03-02 (×2): 200 mg via ORAL
  Filled 2016-02-29 (×2): qty 2

## 2016-02-29 MED ORDER — THROMBIN 20000 UNITS EX SOLR
CUTANEOUS | Status: DC | PRN
  Administered 2016-02-29: 20000 [IU] via TOPICAL

## 2016-02-29 MED ORDER — PHENYLEPHRINE HCL 10 MG/ML IJ SOLN
INTRAMUSCULAR | Status: DC | PRN
  Administered 2016-02-29 (×3): 80 ug via INTRAVENOUS

## 2016-02-29 MED ORDER — LACTATED RINGERS IV SOLN
INTRAVENOUS | Status: DC | PRN
  Administered 2016-02-29: 10:00:00 via INTRAVENOUS

## 2016-02-29 MED ORDER — METOPROLOL TARTRATE 5 MG/5ML IV SOLN
2.5000 mg | INTRAVENOUS | Status: DC | PRN
  Administered 2016-03-01: 2.5 mg via INTRAVENOUS
  Administered 2016-03-01: 5 mg via INTRAVENOUS
  Filled 2016-02-29 (×2): qty 5

## 2016-02-29 MED ORDER — SODIUM CHLORIDE 0.9% FLUSH
3.0000 mL | Freq: Two times a day (BID) | INTRAVENOUS | Status: DC
  Administered 2016-03-01 – 2016-03-02 (×3): 3 mL via INTRAVENOUS

## 2016-02-29 MED ORDER — DEXTROSE 5 % IV SOLN
1.5000 g | Freq: Two times a day (BID) | INTRAVENOUS | Status: AC
  Administered 2016-02-29 – 2016-03-02 (×4): 1.5 g via INTRAVENOUS
  Filled 2016-02-29 (×4): qty 1.5

## 2016-02-29 MED ORDER — MORPHINE SULFATE (PF) 2 MG/ML IV SOLN
INTRAVENOUS | Status: AC
  Administered 2016-02-29: 2 mg via INTRAVENOUS
  Filled 2016-02-29: qty 1

## 2016-02-29 MED ORDER — PANTOPRAZOLE SODIUM 40 MG PO TBEC
40.0000 mg | DELAYED_RELEASE_TABLET | Freq: Every day | ORAL | Status: DC
  Administered 2016-03-02: 40 mg via ORAL
  Filled 2016-02-29: qty 1

## 2016-02-29 MED ORDER — HEMOSTATIC AGENTS (NO CHARGE) OPTIME
TOPICAL | Status: DC | PRN
  Administered 2016-02-29: 1 via TOPICAL

## 2016-02-29 MED ORDER — SODIUM CHLORIDE 0.9 % IV SOLN: 250.0000 mL | INTRAVENOUS | Status: DC

## 2016-02-29 MED ORDER — METOPROLOL TARTRATE 25 MG/10 ML ORAL SUSPENSION
12.5000 mg | Freq: Two times a day (BID) | ORAL | Status: DC
Start: 2016-02-29 — End: 2016-03-01

## 2016-02-29 MED ORDER — THROMBIN 20000 UNITS EX SOLR
OROMUCOSAL | Status: DC | PRN
  Administered 2016-02-29 (×4): 4 mL via TOPICAL

## 2016-02-29 MED ORDER — SODIUM CHLORIDE 0.9% FLUSH: 3.0000 mL | INTRAVENOUS | Status: DC | PRN

## 2016-02-29 MED ORDER — THROMBIN 20000 UNITS EX SOLR
CUTANEOUS | Status: AC
  Filled 2016-02-29: qty 20000

## 2016-02-29 MED ORDER — ACETAMINOPHEN 160 MG/5ML PO SOLN: 1000.0000 mg | Freq: Four times a day (QID) | ORAL | Status: DC

## 2016-02-29 MED ORDER — METOPROLOL TARTRATE 12.5 MG HALF TABLET: 12.5000 mg | ORAL_TABLET | Freq: Two times a day (BID) | ORAL | Status: DC

## 2016-02-29 MED ORDER — PROPOFOL 10 MG/ML IV BOLUS
INTRAVENOUS | Status: DC | PRN
  Administered 2016-02-29: 30 mg via INTRAVENOUS

## 2016-02-29 MED ORDER — MIDAZOLAM HCL 50 MG/10ML IJ SOLN
INTRAMUSCULAR | Status: AC
  Filled 2016-02-29: qty 1

## 2016-02-29 MED ORDER — PROTAMINE SULFATE 10 MG/ML IV SOLN
INTRAVENOUS | Status: DC | PRN
  Administered 2016-02-29: 290 mg via INTRAVENOUS
  Administered 2016-02-29: 10 mg via INTRAVENOUS

## 2016-02-29 MED ORDER — FENTANYL CITRATE (PF) 250 MCG/5ML IJ SOLN
INTRAMUSCULAR | Status: AC
  Filled 2016-02-29: qty 5

## 2016-02-29 MED ORDER — METOCLOPRAMIDE HCL 5 MG/ML IJ SOLN
10.0000 mg | Freq: Four times a day (QID) | INTRAMUSCULAR | Status: AC
  Administered 2016-02-29 – 2016-03-01 (×4): 10 mg via INTRAVENOUS
  Filled 2016-02-29 (×3): qty 2

## 2016-02-29 MED ORDER — FENTANYL CITRATE (PF) 100 MCG/2ML IJ SOLN
50.0000 ug | INTRAMUSCULAR | Status: DC | PRN
  Administered 2016-02-29 – 2016-03-01 (×5): 100 ug via INTRAVENOUS
  Filled 2016-02-29 (×5): qty 2

## 2016-02-29 MED ORDER — PHENYLEPHRINE HCL 10 MG/ML IJ SOLN
0.0000 ug/min | INTRAVENOUS | Status: DC
  Filled 2016-02-29: qty 2

## 2016-02-29 MED ORDER — ESMOLOL HCL 100 MG/10ML IV SOLN
INTRAVENOUS | Status: DC | PRN
  Administered 2016-02-29: 30 mg via INTRAVENOUS

## 2016-02-29 MED ORDER — FENTANYL CITRATE (PF) 100 MCG/2ML IJ SOLN
INTRAMUSCULAR | Status: DC | PRN
  Administered 2016-02-29: 200 ug via INTRAVENOUS
  Administered 2016-02-29: 100 ug via INTRAVENOUS
  Administered 2016-02-29: 50 ug via INTRAVENOUS
  Administered 2016-02-29: 500 ug via INTRAVENOUS
  Administered 2016-02-29 (×2): 100 ug via INTRAVENOUS
  Administered 2016-02-29: 50 ug via INTRAVENOUS
  Administered 2016-02-29 (×2): 100 ug via INTRAVENOUS

## 2016-02-29 MED ORDER — ACETAMINOPHEN 650 MG RE SUPP
650.0000 mg | Freq: Once | RECTAL | Status: AC
  Administered 2016-02-29: 650 mg via RECTAL

## 2016-02-29 MED ORDER — ALBUMIN HUMAN 5 % IV SOLN
250.0000 mL | INTRAVENOUS | Status: DC | PRN
  Administered 2016-02-29 (×2): 250 mL via INTRAVENOUS

## 2016-02-29 MED ORDER — LACTATED RINGERS IV SOLN: 500.0000 mL | Freq: Once | INTRAVENOUS | Status: DC | PRN

## 2016-02-29 MED ORDER — MIDAZOLAM HCL 2 MG/2ML IJ SOLN: 2.0000 mg | INTRAMUSCULAR | Status: DC | PRN

## 2016-02-29 MED ORDER — LIDOCAINE 2% (20 MG/ML) 5 ML SYRINGE
INTRAMUSCULAR | Status: DC | PRN
  Administered 2016-02-29: 100 mg via INTRAVENOUS

## 2016-02-29 MED ORDER — DEXMEDETOMIDINE HCL IN NACL 200 MCG/50ML IV SOLN
0.0000 ug/kg/h | INTRAVENOUS | Status: DC
  Filled 2016-02-29: qty 50

## 2016-02-29 MED ORDER — SODIUM CHLORIDE 0.45 % IV SOLN
INTRAVENOUS | Status: DC | PRN
  Administered 2016-02-29: 16:00:00 via INTRAVENOUS

## 2016-02-29 MED ORDER — POTASSIUM CHLORIDE 10 MEQ/50ML IV SOLN
10.0000 meq | Freq: Once | INTRAVENOUS | Status: AC
  Administered 2016-02-29: 10 meq via INTRAVENOUS

## 2016-02-29 MED ORDER — POTASSIUM CHLORIDE 10 MEQ/50ML IV SOLN
10.0000 meq | INTRAVENOUS | Status: AC
  Administered 2016-02-29 (×3): 10 meq via INTRAVENOUS

## 2016-02-29 MED ORDER — ANTISEPTIC ORAL RINSE SOLUTION (CORINZ)
7.0000 mL | Freq: Four times a day (QID) | OROMUCOSAL | Status: DC
  Administered 2016-03-01 (×4): 7 mL via OROMUCOSAL

## 2016-02-29 MED ORDER — CHLORHEXIDINE GLUCONATE 0.12 % MT SOLN
15.0000 mL | OROMUCOSAL | Status: AC
  Administered 2016-02-29: 15 mL via OROMUCOSAL
  Filled 2016-02-29: qty 15

## 2016-02-29 MED ORDER — ACETAMINOPHEN 160 MG/5ML PO SOLN: 650.0000 mg | Freq: Once | ORAL | Status: AC

## 2016-02-29 MED ORDER — FENTANYL CITRATE (PF) 250 MCG/5ML IJ SOLN
INTRAMUSCULAR | Status: AC
  Filled 2016-02-29: qty 25

## 2016-02-29 MED ORDER — ACETAMINOPHEN 500 MG PO TABS
1000.0000 mg | ORAL_TABLET | Freq: Four times a day (QID) | ORAL | Status: DC
  Administered 2016-02-29 – 2016-03-02 (×6): 1000 mg via ORAL
  Filled 2016-02-29 (×6): qty 2

## 2016-02-29 MED ORDER — BISACODYL 5 MG PO TBEC
10.0000 mg | DELAYED_RELEASE_TABLET | Freq: Every day | ORAL | Status: DC
  Administered 2016-03-01 – 2016-03-02 (×2): 10 mg via ORAL
  Filled 2016-02-29 (×2): qty 2

## 2016-02-29 MED ORDER — MIDAZOLAM HCL 2 MG/2ML IJ SOLN
INTRAMUSCULAR | Status: AC
  Filled 2016-02-29: qty 2

## 2016-02-29 MED ORDER — LACTATED RINGERS IV SOLN
INTRAVENOUS | Status: DC
  Administered 2016-02-29: 10:00:00 via INTRAVENOUS
  Administered 2016-02-29: 50 mL/h via INTRAVENOUS

## 2016-02-29 MED ORDER — SUCCINYLCHOLINE CHLORIDE 20 MG/ML IJ SOLN
INTRAMUSCULAR | Status: DC | PRN
  Administered 2016-02-29: 120 mg via INTRAVENOUS

## 2016-02-29 MED ORDER — INSULIN REGULAR HUMAN 100 UNIT/ML IJ SOLN
INTRAMUSCULAR | Status: DC
  Administered 2016-02-29: 20:00:00 via INTRAVENOUS
  Filled 2016-02-29: qty 2.5

## 2016-02-29 MED ORDER — FAMOTIDINE IN NACL 20-0.9 MG/50ML-% IV SOLN
20.0000 mg | Freq: Two times a day (BID) | INTRAVENOUS | Status: DC
  Administered 2016-02-29: 20 mg via INTRAVENOUS

## 2016-02-29 MED ORDER — BISACODYL 10 MG RE SUPP: 10.0000 mg | Freq: Every day | RECTAL | Status: DC

## 2016-02-29 MED ORDER — MIDAZOLAM HCL 10 MG/2ML IJ SOLN
INTRAMUSCULAR | Status: AC
  Filled 2016-02-29: qty 2

## 2016-02-29 MED ORDER — CHLORHEXIDINE GLUCONATE 0.12% ORAL RINSE (MEDLINE KIT)
15.0000 mL | Freq: Two times a day (BID) | OROMUCOSAL | Status: DC
Start: 2016-02-29 — End: 2016-03-01
  Administered 2016-03-01: 15 mL via OROMUCOSAL

## 2016-02-29 MED ORDER — MAGNESIUM SULFATE 4 GM/100ML IV SOLN
4.0000 g | Freq: Once | INTRAVENOUS | Status: AC
  Administered 2016-02-29: 4 g via INTRAVENOUS
  Filled 2016-02-29: qty 100

## 2016-02-29 MED ORDER — LACTATED RINGERS IV SOLN: INTRAVENOUS | Status: DC

## 2016-02-29 MED ORDER — PROPOFOL 10 MG/ML IV BOLUS
INTRAVENOUS | Status: AC
  Filled 2016-02-29: qty 20

## 2016-02-29 MED ORDER — MIDAZOLAM HCL 5 MG/5ML IJ SOLN
INTRAMUSCULAR | Status: DC | PRN
  Administered 2016-02-29: 2 mg via INTRAVENOUS
  Administered 2016-02-29: 3 mg via INTRAVENOUS

## 2016-02-29 MED ORDER — 0.9 % SODIUM CHLORIDE (POUR BTL) OPTIME
TOPICAL | Status: DC | PRN
  Administered 2016-02-29: 6000 mL

## 2016-02-29 MED ORDER — FENTANYL CITRATE (PF) 100 MCG/2ML IJ SOLN
INTRAMUSCULAR | Status: AC
  Administered 2016-02-29: 50 ug
  Filled 2016-02-29: qty 2

## 2016-02-29 MED ORDER — TRAMADOL HCL 50 MG PO TABS
50.0000 mg | ORAL_TABLET | ORAL | Status: DC | PRN
  Administered 2016-03-01 – 2016-03-03 (×8): 100 mg via ORAL
  Filled 2016-02-29 (×8): qty 2

## 2016-02-29 MED ORDER — VECURONIUM BROMIDE 10 MG IV SOLR
INTRAVENOUS | Status: DC | PRN
  Administered 2016-02-29: 3 mg via INTRAVENOUS
  Administered 2016-02-29 (×2): 5 mg via INTRAVENOUS

## 2016-02-29 MED ORDER — SODIUM CHLORIDE 0.9 % IV SOLN
INTRAVENOUS | Status: DC
  Administered 2016-02-29: 16:00:00 via INTRAVENOUS

## 2016-02-29 MED ORDER — INSULIN REGULAR BOLUS VIA INFUSION
0.0000 [IU] | Freq: Three times a day (TID) | INTRAVENOUS | Status: DC
  Administered 2016-03-01: 2 [IU] via INTRAVENOUS
  Filled 2016-02-29: qty 10

## 2016-02-29 MED ORDER — NITROGLYCERIN IN D5W 200-5 MCG/ML-% IV SOLN: 0.0000 ug/min | INTRAVENOUS | Status: DC

## 2016-02-29 MED ORDER — ASPIRIN 81 MG PO CHEW: 324.0000 mg | CHEWABLE_TABLET | Freq: Every day | ORAL | Status: DC

## 2016-02-29 MED ORDER — ROCURONIUM BROMIDE 10 MG/ML (PF) SYRINGE
PREFILLED_SYRINGE | INTRAVENOUS | Status: DC | PRN
  Administered 2016-02-29 (×2): 50 mg via INTRAVENOUS

## 2016-02-29 MED ORDER — VANCOMYCIN HCL IN DEXTROSE 1-5 GM/200ML-% IV SOLN
1000.0000 mg | Freq: Once | INTRAVENOUS | Status: AC
  Administered 2016-02-29: 1000 mg via INTRAVENOUS
  Filled 2016-02-29: qty 200

## 2016-02-29 MED ORDER — ASPIRIN EC 325 MG PO TBEC
325.0000 mg | DELAYED_RELEASE_TABLET | Freq: Every day | ORAL | Status: DC
  Administered 2016-03-01 – 2016-03-02 (×2): 325 mg via ORAL
  Filled 2016-02-29 (×2): qty 1

## 2016-02-29 MED ORDER — ROCURONIUM BROMIDE 50 MG/5ML IV SOLN
INTRAVENOUS | Status: AC
  Filled 2016-02-29: qty 2

## 2016-02-29 MED ORDER — ALBUMIN HUMAN 5 % IV SOLN
INTRAVENOUS | Status: DC | PRN
  Administered 2016-02-29: 15:00:00 via INTRAVENOUS

## 2016-02-29 MED ORDER — HEPARIN SODIUM (PORCINE) 1000 UNIT/ML IJ SOLN
INTRAMUSCULAR | Status: DC | PRN
  Administered 2016-02-29: 35000 [IU] via INTRAVENOUS

## 2016-02-29 MED ORDER — ONDANSETRON HCL 4 MG/2ML IJ SOLN: 4.0000 mg | Freq: Four times a day (QID) | INTRAMUSCULAR | Status: DC | PRN

## 2016-02-29 MED FILL — Heparin Sodium (Porcine) Inj 1000 Unit/ML: INTRAMUSCULAR | Qty: 30 | Status: AC

## 2016-02-29 MED FILL — Magnesium Sulfate Inj 50%: INTRAMUSCULAR | Qty: 10 | Status: AC

## 2016-02-29 MED FILL — Potassium Chloride Inj 2 mEq/ML: INTRAVENOUS | Qty: 40 | Status: AC

## 2016-02-29 SURGICAL SUPPLY — 108 items
ADH SKN CLS APL DERMABOND .7 (GAUZE/BANDAGES/DRESSINGS) ×2
BAG DECANTER FOR FLEXI CONT (MISCELLANEOUS) ×3 IMPLANT
BANDAGE ELASTIC 4 VELCRO ST LF (GAUZE/BANDAGES/DRESSINGS) ×3 IMPLANT
BANDAGE ELASTIC 6 VELCRO ST LF (GAUZE/BANDAGES/DRESSINGS) ×3 IMPLANT
BASKET HEART (ORDER IN 25'S) (MISCELLANEOUS) ×1
BASKET HEART (ORDER IN 25S) (MISCELLANEOUS) ×2 IMPLANT
BLADE STERNUM SYSTEM 6 (BLADE) ×3 IMPLANT
BNDG GAUZE ELAST 4 BULKY (GAUZE/BANDAGES/DRESSINGS) ×3 IMPLANT
CANISTER SUCTION 2500CC (MISCELLANEOUS) ×3 IMPLANT
CATH ROBINSON RED A/P 18FR (CATHETERS) ×6 IMPLANT
CATH THORACIC 28FR (CATHETERS) ×3 IMPLANT
CATH THORACIC 36FR (CATHETERS) ×3 IMPLANT
CATH THORACIC 36FR RT ANG (CATHETERS) ×3 IMPLANT
CLIP TI MEDIUM 24 (CLIP) IMPLANT
CLIP TI WIDE RED SMALL 24 (CLIP) ×1 IMPLANT
COVER SURGICAL LIGHT HANDLE (MISCELLANEOUS) ×2 IMPLANT
CRADLE DONUT ADULT HEAD (MISCELLANEOUS) ×3 IMPLANT
DERMABOND ADVANCED (GAUZE/BANDAGES/DRESSINGS) ×1
DERMABOND ADVANCED .7 DNX12 (GAUZE/BANDAGES/DRESSINGS) IMPLANT
DRAPE CARDIOVASCULAR INCISE (DRAPES) ×3
DRAPE SLUSH/WARMER DISC (DRAPES) ×3 IMPLANT
DRAPE SRG 135X102X78XABS (DRAPES) ×2 IMPLANT
DRSG COVADERM 4X14 (GAUZE/BANDAGES/DRESSINGS) ×3 IMPLANT
ELECT CAUTERY BLADE 6.4 (BLADE) ×3 IMPLANT
ELECT REM PT RETURN 9FT ADLT (ELECTROSURGICAL) ×6
ELECTRODE REM PT RTRN 9FT ADLT (ELECTROSURGICAL) ×4 IMPLANT
FELT TEFLON 1X6 (MISCELLANEOUS) ×3 IMPLANT
GAUZE SPONGE 4X4 12PLY STRL (GAUZE/BANDAGES/DRESSINGS) ×6 IMPLANT
GLOVE BIO SURGEON STRL SZ 6 (GLOVE) ×1 IMPLANT
GLOVE BIO SURGEON STRL SZ 6.5 (GLOVE) ×2 IMPLANT
GLOVE BIO SURGEON STRL SZ7 (GLOVE) IMPLANT
GLOVE BIO SURGEON STRL SZ7.5 (GLOVE) IMPLANT
GLOVE BIOGEL PI IND STRL 6 (GLOVE) IMPLANT
GLOVE BIOGEL PI IND STRL 6.5 (GLOVE) IMPLANT
GLOVE BIOGEL PI IND STRL 7.0 (GLOVE) IMPLANT
GLOVE BIOGEL PI INDICATOR 6 (GLOVE) ×1
GLOVE BIOGEL PI INDICATOR 6.5 (GLOVE) ×3
GLOVE BIOGEL PI INDICATOR 7.0 (GLOVE) ×1
GLOVE EUDERMIC 7 POWDERFREE (GLOVE) ×6 IMPLANT
GLOVE ORTHO TXT STRL SZ7.5 (GLOVE) IMPLANT
GOWN STRL REUS W/ TWL LRG LVL3 (GOWN DISPOSABLE) ×8 IMPLANT
GOWN STRL REUS W/ TWL XL LVL3 (GOWN DISPOSABLE) ×2 IMPLANT
GOWN STRL REUS W/TWL LRG LVL3 (GOWN DISPOSABLE) ×18
GOWN STRL REUS W/TWL XL LVL3 (GOWN DISPOSABLE) ×3
HEMOSTAT POWDER SURGIFOAM 1G (HEMOSTASIS) ×10 IMPLANT
HEMOSTAT SURGICEL 2X14 (HEMOSTASIS) ×3 IMPLANT
INSERT FOGARTY 61MM (MISCELLANEOUS) IMPLANT
INSERT FOGARTY XLG (MISCELLANEOUS) IMPLANT
KIT BASIN OR (CUSTOM PROCEDURE TRAY) ×3 IMPLANT
KIT CATH CPB BARTLE (MISCELLANEOUS) ×3 IMPLANT
KIT ROOM TURNOVER OR (KITS) ×3 IMPLANT
KIT SUCTION CATH 14FR (SUCTIONS) ×3 IMPLANT
KIT VASOVIEW 6 PRO VH 2400 (KITS) ×3 IMPLANT
NS IRRIG 1000ML POUR BTL (IV SOLUTION) ×16 IMPLANT
PACK OPEN HEART (CUSTOM PROCEDURE TRAY) ×3 IMPLANT
PAD ARMBOARD 7.5X6 YLW CONV (MISCELLANEOUS) ×6 IMPLANT
PAD ELECT DEFIB RADIOL ZOLL (MISCELLANEOUS) ×3 IMPLANT
PENCIL BUTTON HOLSTER BLD 10FT (ELECTRODE) ×3 IMPLANT
PUNCH AORTIC ROTATE 4.0MM (MISCELLANEOUS) IMPLANT
PUNCH AORTIC ROTATE 4.5MM 8IN (MISCELLANEOUS) ×3 IMPLANT
PUNCH AORTIC ROTATE 5MM 8IN (MISCELLANEOUS) IMPLANT
SET CARDIOPLEGIA MPS 5001102 (MISCELLANEOUS) ×1 IMPLANT
SOLUTION ANTI FOG 6CC (MISCELLANEOUS) ×1 IMPLANT
SPONGE INTESTINAL PEANUT (DISPOSABLE) IMPLANT
SPONGE LAP 18X18 X RAY DECT (DISPOSABLE) IMPLANT
SPONGE LAP 4X18 X RAY DECT (DISPOSABLE) ×3 IMPLANT
SURGIFLO W/THROMBIN 8M KIT (HEMOSTASIS) IMPLANT
SUT BONE WAX W31G (SUTURE) ×3 IMPLANT
SUT ETHIBOND 2 0 SH (SUTURE) ×12
SUT ETHIBOND 2 0 SH 36X2 (SUTURE) IMPLANT
SUT MNCRL AB 4-0 PS2 18 (SUTURE) ×1 IMPLANT
SUT PROLENE 3 0 SH DA (SUTURE) IMPLANT
SUT PROLENE 3 0 SH1 36 (SUTURE) ×3 IMPLANT
SUT PROLENE 4 0 RB 1 (SUTURE) ×3
SUT PROLENE 4 0 SH DA (SUTURE) IMPLANT
SUT PROLENE 4-0 RB1 .5 CRCL 36 (SUTURE) IMPLANT
SUT PROLENE 5 0 C 1 36 (SUTURE) ×2 IMPLANT
SUT PROLENE 6 0 C 1 30 (SUTURE) ×2 IMPLANT
SUT PROLENE 7 0 BV 1 (SUTURE) IMPLANT
SUT PROLENE 7 0 BV1 MDA (SUTURE) ×3 IMPLANT
SUT PROLENE 8 0 BV175 6 (SUTURE) ×3 IMPLANT
SUT SILK  1 MH (SUTURE) ×3
SUT SILK 1 MH (SUTURE) IMPLANT
SUT SILK 2 0 SH CR/8 (SUTURE) ×2 IMPLANT
SUT SILK 2 0 TIES 10X30 (SUTURE) ×1 IMPLANT
SUT SILK 3 0 SH CR/8 (SUTURE) ×1 IMPLANT
SUT SILK 4 0 TIE 10X30 (SUTURE) ×1 IMPLANT
SUT STEEL STERNAL CCS#1 18IN (SUTURE) IMPLANT
SUT STEEL SZ 6 DBL 3X14 BALL (SUTURE) IMPLANT
SUT TEM PAC WIRE 2 0 SH (SUTURE) ×4 IMPLANT
SUT VIC AB 1 CTX 36 (SUTURE) ×12
SUT VIC AB 1 CTX36XBRD ANBCTR (SUTURE) ×4 IMPLANT
SUT VIC AB 2-0 CT1 27 (SUTURE) ×3
SUT VIC AB 2-0 CT1 TAPERPNT 27 (SUTURE) IMPLANT
SUT VIC AB 2-0 CTX 27 (SUTURE) ×2 IMPLANT
SUT VIC AB 3-0 SH 27 (SUTURE)
SUT VIC AB 3-0 SH 27X BRD (SUTURE) IMPLANT
SUT VIC AB 3-0 X1 27 (SUTURE) ×2 IMPLANT
SUT VICRYL 4-0 PS2 18IN ABS (SUTURE) IMPLANT
SUTURE E-PAK OPEN HEART (SUTURE) ×2 IMPLANT
SYSTEM SAHARA CHEST DRAIN ATS (WOUND CARE) ×3 IMPLANT
TOWEL OR 17X24 6PK STRL BLUE (TOWEL DISPOSABLE) ×3 IMPLANT
TOWEL OR 17X26 10 PK STRL BLUE (TOWEL DISPOSABLE) ×3 IMPLANT
TRAY FOLEY IC TEMP SENS 16FR (CATHETERS) ×3 IMPLANT
TUBE SUCTION CARDIAC 10FR (CANNULA) ×1 IMPLANT
TUBING INSUFFLATION (TUBING) ×3 IMPLANT
UNDERPAD 30X30 INCONTINENT (UNDERPADS AND DIAPERS) ×3 IMPLANT
WATER STERILE IRR 1000ML POUR (IV SOLUTION) ×6 IMPLANT

## 2016-02-29 NOTE — Procedures (Signed)
Extubation Procedure Note  Patient Details:   Name: Tyler Russell DOB: 12-26-1955 MRN: [id number]   Airway Documentation:     Evaluation  O2 sats: stable throughout Complications: No apparent complications Patient did tolerate procedure well. Bilateral Breath Sounds: Clear   Yes, pt able to cough to clear secretions as well as hoarsely vocalize name. Pt had Nif of -35 and VC of 9L. Pt positive for cuff leak before extubation. Rt placed pt on 4L nasal cannula and pt is tolerating well at this time. RT to monitor as needed.  Tacy Learnurriff, Hayley Horn E 02/29/2016, 8:17 PM

## 2016-02-29 NOTE — Anesthesia Postprocedure Evaluation (Signed)
Anesthesia Post Note  Patient: Tyler KindredFreddie L Ragone  Procedure(s) Performed: Procedure(s) (LRB): CORONARY ARTERY BYPASS GRAFTING (CABG), ON PUMP, TIMES THREE, USING LEFT INTERNAL MAMMARY ARTERY, RIGHT GREATER SAPHENOUS VEIN HARVESTED ENDOSCOPICALLY (N/A) TRANSESOPHAGEAL ECHOCARDIOGRAM (TEE) (N/A)  Patient location during evaluation: SICU Anesthesia Type: General Level of consciousness: patient remains intubated per anesthesia plan and sedated Pain management: pain level controlled Vital Signs Assessment: post-procedure vital signs reviewed and stable Respiratory status: patient remains intubated per anesthesia plan Cardiovascular status: stable Anesthetic complications: no    Last Vitals:  Filed Vitals:   02/29/16 1845 02/29/16 1846  BP:  119/55  Pulse: 91 93  Temp: 36 C   Resp: 15 14    Last Pain:  Filed Vitals:   02/29/16 1855  PainSc: Ardean LarsenAsleep                 Linetta Regner E

## 2016-02-29 NOTE — Progress Notes (Signed)
Patient ID: Tyler Russell, male   DOB: Jan 27, 1956, 60 y.o.   MRN: [id number]   SICU Evening Rounds:   Hemodynamically stable  CI = 2  Still asleep on vent  Urine output good  CT output low  CBC    Component Value Date/Time   WBC 8.9 02/29/2016 1610   WBC 11.3* 03/15/2012 2342   RBC 3.81* 02/29/2016 1610   RBC 4.44 03/15/2012 2342   HGB 10.5* 02/29/2016 1611   HGB 10.9* 03/15/2012 2342   HCT 31.0* 02/29/2016 1611   HCT 35.0* 03/15/2012 2342   PLT 186 02/29/2016 1610   PLT 338 03/15/2012 2342   MCV 79.3 02/29/2016 1610   MCV 79* 03/15/2012 2342   MCH 26.2 02/29/2016 1610   MCH 24.7* 03/15/2012 2342   MCHC 33.1 02/29/2016 1610   MCHC 31.3* 03/15/2012 2342   RDW 13.5 02/29/2016 1610   RDW 16.4* 03/15/2012 2342   LYMPHSABS 2.5 09/03/2011 1327   MONOABS 0.8 09/03/2011 1327   EOSABS 0.1 09/03/2011 1327   BASOSABS 0.0 09/03/2011 1327     BMET    Component Value Date/Time   NA 142 02/29/2016 1611   NA 140 03/15/2012 2342   K 3.4* 02/29/2016 1611   K 3.2* 03/15/2012 2342   CL 103 02/29/2016 1455   CL 106 03/15/2012 2342   CO2 24 02/29/2016 0317   CO2 23 03/15/2012 2342   GLUCOSE 113* 02/29/2016 1611   GLUCOSE 253* 03/15/2012 2342   BUN 12 02/29/2016 1455   BUN 19* 03/15/2012 2342   CREATININE 0.50* 02/29/2016 1455   CREATININE 1.20 03/15/2012 2342   CALCIUM 9.2 02/29/2016 0317   CALCIUM 7.6* 03/15/2012 2342   GFRNONAA >60 02/29/2016 0317   GFRNONAA >60 03/15/2012 2342   GFRAA >60 02/29/2016 0317   GFRAA >60 03/15/2012 2342     A/P:  Stable postop course. Continue current plans

## 2016-02-29 NOTE — Anesthesia Procedure Notes (Addendum)
Central Venous Catheter Insertion Performed by: anesthesiologist Patient location: Pre-op. Preanesthetic checklist: patient identified, IV checked, site marked, risks and benefits discussed, surgical consent, monitors and equipment checked, pre-op evaluation, timeout performed and anesthesia consent Position: Trendelenburg Lidocaine 1% used for infiltration Landmarks identified and Seldinger technique used Catheter size: 8.5 Fr Central line was placed.Sheath introducer Swan type and PA catheter depth:thermodilation and 50PA Cath depth:50 Procedure performed using ultrasound guided technique. Attempts: 3 Following insertion, line sutured and dressing applied. Post procedure assessment: blood return through all ports, free fluid flow and no air. Patient tolerated the procedure well with no immediate complications.   Procedure Name: Intubation Date/Time: 02/29/2016 11:37 AM Performed by: Dairl PonderJIANG, Kiela Shisler Pre-anesthesia Checklist: Timeout performed, Suction available, Patient being monitored, Patient identified and Emergency Drugs available Patient Re-evaluated:Patient Re-evaluated prior to inductionOxygen Delivery Method: Circle system utilized Preoxygenation: Pre-oxygenation with 100% oxygen Intubation Type: IV induction Ventilation: Oral airway inserted - appropriate to patient size Laryngoscope Size: Glidescope and 3 Grade View: Grade I Tube type: Oral Tube size: 8.0 mm Number of attempts: 1 Airway Equipment and Method: Stylet and Video-laryngoscopy Placement Confirmation: ETT inserted through vocal cords under direct vision,  positive ETCO2 and breath sounds checked- equal and bilateral Secured at: 22 cm Tube secured with: Tape Dental Injury: Teeth and Oropharynx as per pre-operative assessment

## 2016-02-29 NOTE — Transfer of Care (Signed)
Immediate Anesthesia Transfer of Care Note  Patient: Tyler Russell  Procedure(s) Performed: Procedure(s) with comments: CORONARY ARTERY BYPASS GRAFTING (CABG), ON PUMP, TIMES THREE, USING LEFT INTERNAL MAMMARY ARTERY, RIGHT GREATER SAPHENOUS VEIN HARVESTED ENDOSCOPICALLY (N/A) - LIMA to LAD, SVG to RAMUS INTERMEDIATE, SVG to OM TRANSESOPHAGEAL ECHOCARDIOGRAM (TEE) (N/A)  Patient Location: SICU  Anesthesia Type:General  Level of Consciousness: Patient remains intubated per anesthesia plan  Airway & Oxygen Therapy: Patient remains intubated per anesthesia plan  Post-op Assessment: Report given to RN and Post -op Vital signs reviewed and stable  Post vital signs: Reviewed and stable  Last Vitals:  Filed Vitals:   02/29/16 1040 02/29/16 1602  BP: 149/88 111/60  Pulse: 69 95  Temp:    Resp: 13 12    Last Pain:  Filed Vitals:   02/29/16 1604  PainSc: Asleep      Patients Stated Pain Goal: 0 (02/28/16 1900)  Complications: No apparent anesthesia complications

## 2016-02-29 NOTE — Progress Notes (Signed)
RT Note: Wean protocol initiated at this time, pt did not tolerate due to decreased pt effort, will try again later when pt more awake.

## 2016-02-29 NOTE — Op Note (Signed)
CARDIOVASCULAR SURGERY OPERATIVE NOTE  02/29/2016  Surgeon:  Alleen Borne, MD  First Assistant: Lowella Dandy, PA-C   Preoperative Diagnosis:  Severe multi-vessel coronary artery disease   Postoperative Diagnosis:  Same   Procedure:  1. Median Sternotomy 2. Extracorporeal circulation 3.   Coronary artery bypass grafting x 3   Left internal mammary graft to the LAD  SVG to Ramus  SVG to OM   4.   Endoscopic vein harvest from the right leg   Anesthesia:  General Endotracheal   Clinical History/Surgical Indication:  The patient is a 60 year old with hypertension, hypercholesterolemia, DM on no meds, previous smoking and a family history of CAD who presented with a NSTEMI to [facility]. Cath shows a 99% ostial LAD stenosis, 70% proximal LAD stenosis, 85% proximal LCX stenosis, 80% ramus stenosis and 80% proximal to mid RCA stenosis. EF is 60%. It was felt that CABG was the best treatment and he was transferred here for surgery. I discussed the operative procedure with the patient and family including alternatives, benefits and risks; including but not limited to bleeding, blood transfusion, infection, stroke, myocardial infarction, graft failure, heart block requiring a permanent pacemaker, organ dysfunction, and death.  Tora Kindred understands and agrees to proceed.    Preparation:  The patient was seen in the preoperative holding area and the correct patient, correct operation were confirmed with the patient after reviewing the medical record and catheterization. The consent was signed by me. Preoperative antibiotics were given. A pulmonary arterial line and radial arterial line were placed by the anesthesia team. The patient was taken back to the operating room and positioned supine on the operating room table. After being placed under general endotracheal anesthesia by the  anesthesia team a foley catheter was placed. The neck, chest, abdomen, and both legs were prepped with betadine soap and solution and draped in the usual sterile manner. A surgical time-out was taken and the correct patient and operative procedure were confirmed with the nursing and anesthesia staff.   Cardiopulmonary Bypass:  A median sternotomy was performed. The pericardium was opened in the midline. Right ventricular function appeared normal. The ascending aorta was of normal size and had no palpable plaque. There were no contraindications to aortic cannulation or cross-clamping. The patient was fully systemically heparinized and the ACT was maintained > 400 sec. The proximal aortic arch was cannulated with a 22 F aortic cannula for arterial inflow. Venous cannulation was performed via the right atrial appendage using a two-staged venous cannula. An antegrade cardioplegia/vent cannula was inserted into the mid-ascending aorta. Aortic occlusion was performed with a single cross-clamp. Systemic cooling to 32 degrees Centigrade and topical cooling of the heart with iced saline were used. Hyperkalemic antegrade cold blood cardioplegia was used to induce diastolic arrest and was then given at about 20 minute intervals throughout the period of arrest to maintain myocardial temperature at or below 10 degrees centigrade. A temperature probe was inserted into the interventricular septum and an insulating pad was placed in the pericardium.   Left internal mammary harvest:  The left side of the sternum was retracted using the Rultract retractor. The left internal mammary artery was harvested as a pedicle graft. All side branches were clipped. It was a small-sized vessel of good quality with excellent blood flow. It was ligated distally and divided. It was sprayed with topical papaverine solution to prevent vasospasm.   Endoscopic vein harvest:  The right greater saphenous vein was harvested endoscopically  through a 2 cm incision medial to the right knee. It was harvested from the upper thigh to below the knee. It was a medium-sized vein of good quality. The side branches were all ligated with 4-0 silk ties.    Coronary arteries:  The coronary arteries were examined.   LAD:  Calcified plaque in the proximal vessel with mild segmental disease in the proximal and distal vessel.  LCX:  Ramus diffusely diseased extending out into the distal vessel. There was one small area that was soft enough to graft. The OM was small caliber as was the PDA off the left circumflex but they had no disease and communicated freely on cath. The PDA was smaller so the OM was grafted.   RCA:  Small and nondominant with the only terminal branch being a small acute marginal that was diffusely diseased.   Grafts:  1. LIMA to the LAD: 1.6 mm. It was sewn end to side using 8-0 prolene continuous suture. 2. SVG to Ramus:  1.6 mm. It was sewn end to side using 7-0 prolene continuous suture. 3. SVG to OM:  1.6 mm. It was sewn end to side using 7-0 prolene continuous suture.   The proximal vein graft anastomoses were performed to the mid-ascending aorta using continuous 6-0 prolene suture. Graft markers were placed around the proximal anastomoses.   Completion:  The patient was rewarmed to 37 degrees Centigrade. The clamp was removed from the LIMA pedicle and there was rapid warming of the septum and return of ventricular fibrillation. The crossclamp was removed with a time of 70 minutes. There was spontaneous return of sinus rhythm. The distal and proximal anastomoses were checked for hemostasis. The position of the grafts was satisfactory. Two temporary epicardial pacing wires were placed on the right atrium and two on the right ventricle. The patient was weaned from CPB without difficulty on no inotropes. CPB time was 87 minutes. Cardiac output was 4 LPM. Heparin was fully reversed with protamine and the aortic and venous  cannulas removed. Hemostasis was achieved. Mediastinal and left pleural drainage tubes were placed. The sternum was closed with double #6 stainless steel wires. The fascia was closed with continuous # 1 vicryl suture. The subcutaneous tissue was closed with 2-0 vicryl continuous suture. The skin was closed with 3-0 vicryl subcuticular suture. All sponge, needle, and instrument counts were reported correct at the end of the case. Dry sterile dressings were placed over the incisions and around the chest tubes which were connected to pleurevac suction. The patient was then transported to the surgical intensive care unit in critical but stable condition.

## 2016-02-29 NOTE — Anesthesia Preprocedure Evaluation (Addendum)
Anesthesia Evaluation  Patient identified by MRN, date of birth, ID band Patient awake    Reviewed: Allergy & Precautions, NPO status , Patient's Chart, lab work & pertinent test results, reviewed documented beta blocker date and time   Airway Mallampati: II  TM Distance: >3 FB Neck ROM: Full    Dental  (+) Dental Advisory Given   Pulmonary former smoker,    breath sounds clear to auscultation       Cardiovascular hypertension, Pt. on medications and Pt. on home beta blockers + CAD and + Past MI   Rhythm:Regular Rate:Normal     Neuro/Psych negative neurological ROS     GI/Hepatic negative GI ROS, Neg liver ROS,   Endo/Other  negative endocrine ROS  Renal/GU Renal disease     Musculoskeletal  (+) Arthritis ,   Abdominal   Peds  Hematology  (+) anemia ,   Anesthesia Other Findings   Reproductive/Obstetrics                            Lab Results  Component Value Date   WBC 7.3 02/29/2016   HGB 12.7* 02/29/2016   HCT 39.6 02/29/2016   MCV 81.5 02/29/2016   PLT 279 02/29/2016   Lab Results  Component Value Date   CREATININE 0.92 02/29/2016   BUN 12 02/29/2016   NA 138 02/29/2016   K 3.9 02/29/2016   CL 105 02/29/2016   CO2 24 02/29/2016   Lab Results  Component Value Date   INR 1.08 02/29/2016   INR 1.16 02/27/2016   INR 1.08 02/27/2016    Anesthesia Physical Anesthesia Plan  ASA: IV  Anesthesia Plan: General   Post-op Pain Management:    Induction: Intravenous  Airway Management Planned: Oral ETT  Additional Equipment: Arterial line, CVP, TEE, PA Cath and Ultrasound Guidance Line Placement  Intra-op Plan:   Post-operative Plan: Post-operative intubation/ventilation  Informed Consent: I have reviewed the patients History and Physical, chart, labs and discussed the procedure including the risks, benefits and alternatives for the proposed anesthesia with the  patient or authorized representative who has indicated his/her understanding and acceptance.   Dental advisory given  Plan Discussed with: CRNA  Anesthesia Plan Comments:         Anesthesia Quick Evaluation

## 2016-02-29 NOTE — Brief Op Note (Signed)
02/27/2016 - 02/29/2016  2:16 PM  PATIENT:  Tyler Russell  60 y.o. male  PRE-OPERATIVE DIAGNOSIS:  CAD LMD  POST-OPERATIVE DIAGNOSIS:  CAD LMD  PROCEDURE:  Procedure(s):  CORONARY ARTERY BYPASS GRAFTINGx 3 -LIMA to LAD -SVG to RAMUS INTERMEDIATE -SVG to OM  ENDOSCOPIC HARVEST GREATER SAPHENOUS VEIN -Right Thigh to Below the Knee  TRANSESOPHAGEAL ECHOCARDIOGRAM (TEE) (N/A)  SURGEON:  Surgeon(s) and Role:    * Alleen BorneBryan K Bartle, MD - Primary  PHYSICIAN ASSISTANT: Erin Barrett PA-C  ANESTHESIA:   general  EBL:  Total I/O In: 21.4 [I.V.:21.4] Out: 850 [Urine:850]  BLOOD ADMINISTERED: CELLSAVER  DRAINS: Left Pleural Chest Tube, Mediastinal Chest Drains   LOCAL MEDICATIONS USED:  NONE  SPECIMEN:  No Specimen  DISPOSITION OF SPECIMEN:  N/A  COUNTS:  YES  TOURNIQUET:  * No tourniquets in log *  DICTATION: .Dragon Dictation  PLAN OF CARE: Admit to inpatient   PATIENT DISPOSITION:  ICU - intubated and hemodynamically stable.   Delay start of Pharmacological VTE agent (>24hrs) due to surgical blood loss or risk of bleeding: yes

## 2016-03-01 ENCOUNTER — Inpatient Hospital Stay (HOSPITAL_COMMUNITY): Payer: Medicare Other

## 2016-03-01 LAB — GLUCOSE, CAPILLARY
GLUCOSE-CAPILLARY: 100 mg/dL — AB (ref 65–99)
GLUCOSE-CAPILLARY: 107 mg/dL — AB (ref 65–99)
GLUCOSE-CAPILLARY: 108 mg/dL — AB (ref 65–99)
GLUCOSE-CAPILLARY: 116 mg/dL — AB (ref 65–99)
GLUCOSE-CAPILLARY: 125 mg/dL — AB (ref 65–99)
GLUCOSE-CAPILLARY: 142 mg/dL — AB (ref 65–99)
GLUCOSE-CAPILLARY: 144 mg/dL — AB (ref 65–99)
GLUCOSE-CAPILLARY: 157 mg/dL — AB (ref 65–99)
GLUCOSE-CAPILLARY: 178 mg/dL — AB (ref 65–99)
GLUCOSE-CAPILLARY: 184 mg/dL — AB (ref 65–99)
GLUCOSE-CAPILLARY: 83 mg/dL (ref 65–99)
GLUCOSE-CAPILLARY: 99 mg/dL (ref 65–99)
Glucose-Capillary: 130 mg/dL — ABNORMAL HIGH (ref 65–99)
Glucose-Capillary: 127 mg/dL — ABNORMAL HIGH (ref 65–99)
Glucose-Capillary: 178 mg/dL — ABNORMAL HIGH (ref 65–99)
Glucose-Capillary: 122 mg/dL — ABNORMAL HIGH (ref 65–99)
Glucose-Capillary: 103 mg/dL — ABNORMAL HIGH (ref 65–99)
Glucose-Capillary: 123 mg/dL — ABNORMAL HIGH (ref 65–99)
Glucose-Capillary: 78 mg/dL (ref 65–99)
Glucose-Capillary: 111 mg/dL — ABNORMAL HIGH (ref 65–99)

## 2016-03-01 LAB — CBC
HEMATOCRIT: 30.8 % — AB (ref 39.0–52.0)
HEMATOCRIT: 31.4 % — AB (ref 39.0–52.0)
Hemoglobin: 9.9 g/dL — ABNORMAL LOW (ref 13.0–17.0)
Hemoglobin: 10.1 g/dL — ABNORMAL LOW (ref 13.0–17.0)
MCH: 25.9 pg — AB (ref 26.0–34.0)
MCH: 26.1 pg (ref 26.0–34.0)
MCHC: 32.1 g/dL (ref 30.0–36.0)
MCHC: 32.2 g/dL (ref 30.0–36.0)
MCV: 80.6 fL (ref 78.0–100.0)
MCV: 81.1 fL (ref 78.0–100.0)
PLATELETS: 217 10*3/uL (ref 150–400)
Platelets: 220 10*3/uL (ref 150–400)
RBC: 3.82 MIL/uL — ABNORMAL LOW (ref 4.22–5.81)
RBC: 3.87 MIL/uL — ABNORMAL LOW (ref 4.22–5.81)
RDW: 13.7 % (ref 11.5–15.5)
RDW: 13.9 % (ref 11.5–15.5)
WBC: 9.8 10*3/uL (ref 4.0–10.5)
WBC: 12.6 10*3/uL — AB (ref 4.0–10.5)

## 2016-03-01 LAB — POCT I-STAT, CHEM 8
BUN: 9 mg/dL (ref 6–20)
CALCIUM ION: 1.19 mmol/L (ref 1.12–1.23)
CREATININE: 0.6 mg/dL — AB (ref 0.61–1.24)
Chloride: 100 mmol/L — ABNORMAL LOW (ref 101–111)
GLUCOSE: 151 mg/dL — AB (ref 65–99)
HCT: 32 % — ABNORMAL LOW (ref 39.0–52.0)
HEMOGLOBIN: 10.9 g/dL — AB (ref 13.0–17.0)
POTASSIUM: 4.2 mmol/L (ref 3.5–5.1)
Sodium: 135 mmol/L (ref 135–145)
TCO2: 23 mmol/L (ref 0–100)

## 2016-03-01 LAB — BASIC METABOLIC PANEL
ANION GAP: 8 (ref 5–15)
BUN: 9 mg/dL (ref 6–20)
CALCIUM: 8 mg/dL — AB (ref 8.9–10.3)
CO2: 23 mmol/L (ref 22–32)
Chloride: 106 mmol/L (ref 101–111)
Creatinine, Ser: 0.69 mg/dL (ref 0.61–1.24)
GFR calc Af Amer: >60 mL/min (ref >60)
GLUCOSE: 98 mg/dL (ref 65–99)
POTASSIUM: 4 mmol/L (ref 3.5–5.1)
Sodium: 137 mmol/L (ref 135–145)

## 2016-03-01 LAB — CREATININE, SERUM
Creatinine, Ser: 0.74 mg/dL (ref 0.61–1.24)
GFR calc non Af Amer: >60 mL/min (ref >60)

## 2016-03-01 LAB — MAGNESIUM
Magnesium: 2.4 mg/dL (ref 1.7–2.4)
Magnesium: 2.2 mg/dL (ref 1.7–2.4)

## 2016-03-01 MED ORDER — INSULIN DETEMIR 100 UNIT/ML ~~LOC~~ SOLN
20.0000 [IU] | Freq: Every day | SUBCUTANEOUS | Status: DC
  Filled 2016-03-01: qty 0.2

## 2016-03-01 MED ORDER — METOPROLOL TARTRATE 25 MG PO TABS
25.0000 mg | ORAL_TABLET | Freq: Two times a day (BID) | ORAL | Status: DC
  Administered 2016-03-01 – 2016-03-02 (×3): 25 mg via ORAL
  Filled 2016-03-01 (×3): qty 1

## 2016-03-01 MED ORDER — INSULIN ASPART 100 UNIT/ML ~~LOC~~ SOLN
0.0000 [IU] | SUBCUTANEOUS | Status: DC
  Administered 2016-03-01: 4 [IU] via SUBCUTANEOUS
  Administered 2016-03-01 – 2016-03-02 (×3): 2 [IU] via SUBCUTANEOUS

## 2016-03-01 MED ORDER — ATORVASTATIN CALCIUM 40 MG PO TABS
40.0000 mg | ORAL_TABLET | Freq: Every day | ORAL | Status: DC
  Administered 2016-03-01 – 2016-03-03 (×3): 40 mg via ORAL
  Filled 2016-03-01 (×3): qty 1

## 2016-03-01 MED ORDER — METOPROLOL TARTRATE 25 MG/10 ML ORAL SUSPENSION: 25.0000 mg | Freq: Two times a day (BID) | ORAL | Status: DC

## 2016-03-01 MED ORDER — GEMFIBROZIL 600 MG PO TABS
600.0000 mg | ORAL_TABLET | Freq: Two times a day (BID) | ORAL | Status: DC
  Administered 2016-03-01 – 2016-03-04 (×6): 600 mg via ORAL
  Filled 2016-03-01 (×8): qty 1

## 2016-03-01 MED ORDER — INSULIN DETEMIR 100 UNIT/ML ~~LOC~~ SOLN
20.0000 [IU] | Freq: Once | SUBCUTANEOUS | Status: AC
  Administered 2016-03-01: 20 [IU] via SUBCUTANEOUS
  Filled 2016-03-01: qty 0.2

## 2016-03-01 MED ORDER — ENOXAPARIN SODIUM 40 MG/0.4ML ~~LOC~~ SOLN
40.0000 mg | Freq: Every day | SUBCUTANEOUS | Status: DC
  Administered 2016-03-01 – 2016-03-03 (×3): 40 mg via SUBCUTANEOUS
  Filled 2016-03-01 (×3): qty 0.4

## 2016-03-01 NOTE — Progress Notes (Signed)
1 Day Post-Op Procedure(s) (LRB): CORONARY ARTERY BYPASS GRAFTING (CABG), ON PUMP, TIMES THREE, USING LEFT INTERNAL MAMMARY ARTERY, RIGHT GREATER SAPHENOUS VEIN HARVESTED ENDOSCOPICALLY (N/A) TRANSESOPHAGEAL ECHOCARDIOGRAM (TEE) (N/A) Subjective:  No complaints  Objective: Vital signs in last 24 hours: Temp:  [96.1 F (35.6 C)-98.6 F (37 C)] 98.2 F (36.8 C) (06/17 0742) Pulse Rate:  [69-101] 86 (06/17 0742) Cardiac Rhythm:  [-] Normal sinus rhythm;Sinus tachycardia (06/17 0800) Resp:  [9-24] 16 (06/17 0742) BP: (79-149)/(52-88) 119/83 mmHg (06/17 0720) SpO2:  [95 %-100 %] 100 % (06/17 0742) Arterial Line BP: (78-181)/(37-72) 161/60 mmHg (06/17 0742) FiO2 (%):  [40 %-50 %] 40 % (06/16 1846) Weight:  [77.474 kg (170 lb 12.8 oz)] 77.474 kg (170 lb 12.8 oz) (06/17 0500)  Hemodynamic parameters for last 24 hours: PAP: (11-27)/(0-17) 23/12 mmHg CO:  [3.7 L/min-8.7 L/min] 4.4 L/min CI:  [2.1 L/min/m2-4.8 L/min/m2] 2.4 L/min/m2  Intake/Output from previous day: 06/16 0701 - 06/17 0700 In: 4377.5 [I.V.:2544.5; Blood:283; IV Piggyback:1550] Out: 2740 [Urine:2430; Chest Tube:310] Intake/Output this shift: Total I/O In: 43.4 [I.V.:43.4] Out: 90 [Urine:70; Chest Tube:20]  General appearance: alert and cooperative Neurologic: intact Heart: regular rate and rhythm, S1, S2 normal, no murmur, click, rub or gallop Lungs: clear to auscultation bilaterally Extremities: edema mild Wound: dressings dry  Lab Results:  Recent Labs  02/29/16 2205 03/01/16 0430  WBC 11.7* 9.8  HGB 9.9*  10.2* 9.9*  HCT 30.5*  30.0* 30.8*  PLT 198 217   BMET:  Recent Labs  02/29/16 0317  02/29/16 2205 03/01/16 0430  NA 138  < > 141 137  K 3.9  < > 3.9 4.0  CL 105  < > 105 106  CO2 24  --   --  23  GLUCOSE 132*  < > 152* 98  BUN 12  < > 12 9  CREATININE 0.92  < > 0.76  0.70 0.69  CALCIUM 9.2  --   --  8.0*  < > = values in this interval not displayed.  PT/INR:  Recent Labs   02/29/16 1610  LABPROT 17.0*  INR 1.37   ABG    Component Value Date/Time   PHART 7.328* 02/29/2016 2058   HCO3 19.9* 02/29/2016 2058   TCO2 23 02/29/2016 2205   ACIDBASEDEF 6.0* 02/29/2016 2058   O2SAT 98.0 02/29/2016 2058   CBG (last 3)   Recent Labs  03/01/16 0219 03/01/16 0316 03/01/16 0403  GLUCAP 108* 103* 99   CXR: clear  ECG: sinus, non-specific changes  Assessment/Plan: S/P Procedure(s) (LRB): CORONARY ARTERY BYPASS GRAFTING (CABG), ON PUMP, TIMES THREE, USING LEFT INTERNAL MAMMARY ARTERY, RIGHT GREATER SAPHENOUS VEIN HARVESTED ENDOSCOPICALLY (N/A) TRANSESOPHAGEAL ECHOCARDIOGRAM (TEE) (N/A)  He has been hemodynamically stable postop. Continue beta blocker. Mobilize Diuresis Diabetes control: preop Hgb A1c was 8.7. Will transition insulin drip to Levemir and SSI. He was not on any meds preop and says he was told by his PCP that he had borderline DM. He will need oral med and dietary education.  Hyperlipidemia: LDL 158, HDL 41. Will resume Lopid and start Lipitor. d/c tubes/lines Continue foley due to diuresing patient and patient in ICU See progression orders   LOS: 3 days    Alleen BorneBryan K Bartle 03/01/2016

## 2016-03-01 NOTE — Progress Notes (Signed)
Patient ID: Tyler KindredFreddie L Russell, male   DOB: 15-Sep-1956, 60 y.o.   MRN: [id number]  SICU Evening Rounds:  Hemodynamically stable in sinus rhythm.  Urine output ok  PM labs ok

## 2016-03-02 ENCOUNTER — Inpatient Hospital Stay (HOSPITAL_COMMUNITY): Payer: Medicare Other

## 2016-03-02 LAB — GLUCOSE, CAPILLARY
GLUCOSE-CAPILLARY: 113 mg/dL — AB (ref 65–99)
GLUCOSE-CAPILLARY: 142 mg/dL — AB (ref 65–99)
GLUCOSE-CAPILLARY: 191 mg/dL — AB (ref 65–99)
GLUCOSE-CAPILLARY: 151 mg/dL — AB (ref 65–99)
Glucose-Capillary: 124 mg/dL — ABNORMAL HIGH (ref 65–99)
Glucose-Capillary: 163 mg/dL — ABNORMAL HIGH (ref 65–99)

## 2016-03-02 LAB — CBC
HCT: 28.6 % — ABNORMAL LOW (ref 39.0–52.0)
Hemoglobin: 9.2 g/dL — ABNORMAL LOW (ref 13.0–17.0)
MCH: 26.1 pg (ref 26.0–34.0)
MCHC: 32.2 g/dL (ref 30.0–36.0)
MCV: 81.3 fL (ref 78.0–100.0)
PLATELETS: 186 10*3/uL (ref 150–400)
RBC: 3.52 MIL/uL — ABNORMAL LOW (ref 4.22–5.81)
RDW: 14.2 % (ref 11.5–15.5)
WBC: 9.2 10*3/uL (ref 4.0–10.5)

## 2016-03-02 LAB — BASIC METABOLIC PANEL
Anion gap: 6 (ref 5–15)
BUN: 8 mg/dL (ref 6–20)
CHLORIDE: 102 mmol/L (ref 101–111)
CO2: 25 mmol/L (ref 22–32)
Calcium: 8.2 mg/dL — ABNORMAL LOW (ref 8.9–10.3)
Creatinine, Ser: 0.68 mg/dL (ref 0.61–1.24)
GFR calc Af Amer: >60 mL/min (ref >60)
GFR calc non Af Amer: >60 mL/min (ref >60)
GLUCOSE: 138 mg/dL — AB (ref 65–99)
POTASSIUM: 3.9 mmol/L (ref 3.5–5.1)
Sodium: 133 mmol/L — ABNORMAL LOW (ref 135–145)

## 2016-03-02 MED ORDER — ONDANSETRON HCL 4 MG PO TABS
4.0000 mg | ORAL_TABLET | Freq: Four times a day (QID) | ORAL | Status: DC | PRN
  Administered 2016-03-02: 4 mg via ORAL
  Filled 2016-03-02: qty 1

## 2016-03-02 MED ORDER — METFORMIN HCL 850 MG PO TABS
850.0000 mg | ORAL_TABLET | Freq: Two times a day (BID) | ORAL | Status: DC
  Administered 2016-03-02 – 2016-03-04 (×4): 850 mg via ORAL
  Filled 2016-03-02 (×6): qty 1

## 2016-03-02 MED ORDER — METOCLOPRAMIDE HCL 5 MG/ML IJ SOLN
10.0000 mg | Freq: Four times a day (QID) | INTRAMUSCULAR | Status: DC
  Administered 2016-03-02 – 2016-03-03 (×4): 10 mg via INTRAVENOUS
  Filled 2016-03-02 (×4): qty 2

## 2016-03-02 MED ORDER — LACTULOSE 10 GM/15ML PO SOLN: 10.0000 g | Freq: Every day | ORAL | Status: DC | PRN

## 2016-03-02 MED ORDER — POTASSIUM CHLORIDE CRYS ER 20 MEQ PO TBCR
40.0000 meq | EXTENDED_RELEASE_TABLET | Freq: Once | ORAL | Status: AC
  Administered 2016-03-02: 40 meq via ORAL
  Filled 2016-03-02: qty 2

## 2016-03-02 MED ORDER — MOVING RIGHT ALONG BOOK
Freq: Once | Status: DC
  Filled 2016-03-02: qty 1

## 2016-03-02 MED ORDER — SODIUM CHLORIDE 0.9% FLUSH
3.0000 mL | Freq: Two times a day (BID) | INTRAVENOUS | Status: DC
  Administered 2016-03-02 – 2016-03-03 (×3): 3 mL via INTRAVENOUS

## 2016-03-02 MED ORDER — BISACODYL 10 MG RE SUPP
10.0000 mg | Freq: Every day | RECTAL | Status: DC | PRN
  Administered 2016-03-03: 10 mg via RECTAL
  Filled 2016-03-02: qty 1

## 2016-03-02 MED ORDER — DOCUSATE SODIUM 100 MG PO CAPS
200.0000 mg | ORAL_CAPSULE | Freq: Every day | ORAL | Status: DC
  Administered 2016-03-03 – 2016-03-04 (×2): 200 mg via ORAL
  Filled 2016-03-02 (×2): qty 2

## 2016-03-02 MED ORDER — SODIUM CHLORIDE 0.9 % IV SOLN: 250.0000 mL | INTRAVENOUS | Status: DC | PRN

## 2016-03-02 MED ORDER — ASPIRIN EC 325 MG PO TBEC
325.0000 mg | DELAYED_RELEASE_TABLET | Freq: Every day | ORAL | Status: DC
  Administered 2016-03-03 – 2016-03-04 (×2): 325 mg via ORAL
  Filled 2016-03-02 (×2): qty 1

## 2016-03-02 MED ORDER — BISACODYL 5 MG PO TBEC: 10.0000 mg | DELAYED_RELEASE_TABLET | Freq: Every day | ORAL | Status: DC | PRN

## 2016-03-02 MED ORDER — POTASSIUM CHLORIDE CRYS ER 20 MEQ PO TBCR
20.0000 meq | EXTENDED_RELEASE_TABLET | Freq: Two times a day (BID) | ORAL | Status: AC
  Administered 2016-03-03 (×2): 20 meq via ORAL
  Filled 2016-03-02 (×2): qty 1

## 2016-03-02 MED ORDER — SODIUM CHLORIDE 0.9% FLUSH
3.0000 mL | INTRAVENOUS | Status: DC | PRN
  Administered 2016-03-02: 21:00:00 via INTRAVENOUS
  Filled 2016-03-02: qty 3

## 2016-03-02 MED ORDER — FUROSEMIDE 40 MG PO TABS
40.0000 mg | ORAL_TABLET | Freq: Every day | ORAL | Status: AC
  Administered 2016-03-03: 40 mg via ORAL
  Filled 2016-03-02: qty 1

## 2016-03-02 MED ORDER — INSULIN ASPART 100 UNIT/ML ~~LOC~~ SOLN
0.0000 [IU] | Freq: Three times a day (TID) | SUBCUTANEOUS | Status: DC
  Administered 2016-03-02 (×2): 4 [IU] via SUBCUTANEOUS
  Administered 2016-03-03 – 2016-03-04 (×4): 2 [IU] via SUBCUTANEOUS

## 2016-03-02 MED ORDER — FUROSEMIDE 10 MG/ML IJ SOLN
40.0000 mg | Freq: Once | INTRAMUSCULAR | Status: AC
  Administered 2016-03-02: 40 mg via INTRAVENOUS
  Filled 2016-03-02: qty 4

## 2016-03-02 MED ORDER — PANTOPRAZOLE SODIUM 40 MG PO TBEC
40.0000 mg | DELAYED_RELEASE_TABLET | Freq: Every day | ORAL | Status: DC
  Administered 2016-03-03 – 2016-03-04 (×2): 40 mg via ORAL
  Filled 2016-03-02 (×2): qty 1

## 2016-03-02 MED ORDER — METOPROLOL TARTRATE 50 MG PO TABS
50.0000 mg | ORAL_TABLET | Freq: Two times a day (BID) | ORAL | Status: DC
  Administered 2016-03-02 – 2016-03-04 (×4): 50 mg via ORAL
  Filled 2016-03-02 (×4): qty 1

## 2016-03-02 MED ORDER — ONDANSETRON HCL 4 MG/2ML IJ SOLN
4.0000 mg | Freq: Four times a day (QID) | INTRAMUSCULAR | Status: DC | PRN
  Administered 2016-03-03: 4 mg via INTRAVENOUS
  Filled 2016-03-02: qty 2

## 2016-03-02 NOTE — Progress Notes (Signed)
Orders received for transfer to 2W tele. Report called to receiving RN. Pt ambulated with minimal assist to new room. Receiving RN in room and patients family aware of patients new assigned room.  Evern BioVernon, Jancie Kercher E

## 2016-03-02 NOTE — Progress Notes (Signed)
2 Days Post-Op Procedure(s) (LRB): CORONARY ARTERY BYPASS GRAFTING (CABG), ON PUMP, TIMES THREE, USING LEFT INTERNAL MAMMARY ARTERY, RIGHT GREATER SAPHENOUS VEIN HARVESTED ENDOSCOPICALLY (N/A) TRANSESOPHAGEAL ECHOCARDIOGRAM (TEE) (N/A) Subjective:  No complaints  Objective: Vital signs in last 24 hours: Temp:  [97.4 F (36.3 C)-98.6 F (37 C)] 97.4 F (36.3 C) (06/18 0449) Pulse Rate:  [83-100] 100 (06/18 0800) Cardiac Rhythm:  [-] Normal sinus rhythm;Sinus tachycardia (06/18 0800) Resp:  [11-23] 18 (06/18 0800) BP: (110-149)/(61-82) 149/82 mmHg (06/18 0800) SpO2:  [90 %-99 %] 97 % (06/18 0800) Arterial Line BP: (158-191)/(53-71) 191/68 mmHg (06/17 1200) Weight:  [76.431 kg (168 lb 8 oz)] 76.431 kg (168 lb 8 oz) (06/18 0500)  Hemodynamic parameters for last 24 hours: PAP: (20)/(6-8) 20/8 mmHg  Intake/Output from previous day: 06/17 0701 - 06/18 0700 In: 716.6 [P.O.:60; I.V.:556.6; IV Piggyback:100] Out: 2560 [Urine:2460; Chest Tube:100] Intake/Output this shift: Total I/O In: 20 [I.V.:20] Out: -   General appearance: alert and cooperative Neurologic: intact Heart: regular rate and rhythm, S1, S2 normal, no murmur, click, rub or gallop Lungs: clear to auscultation bilaterally Extremities: extremities normal, atraumatic, no cyanosis or edema Wound: incision ok  Lab Results:  Recent Labs  03/01/16 1610 03/01/16 1618 03/02/16 0413  WBC 12.6*  --  9.2  HGB 10.1* 10.9* 9.2*  HCT 31.4* 32.0* 28.6*  PLT 220  --  186   BMET:  Recent Labs  03/01/16 0430  03/01/16 1618 03/02/16 0413  NA 137  --  135 133*  K 4.0  --  4.2 3.9  CL 106  --  100* 102  CO2 23  --   --  25  GLUCOSE 98  --  151* 138*  BUN 9  --  9 8  CREATININE 0.69  < > 0.60* 0.68  CALCIUM 8.0*  --   --  8.2*  < > = values in this interval not displayed.  PT/INR:  Recent Labs  02/29/16 1610  LABPROT 17.0*  INR 1.37   ABG    Component Value Date/Time   PHART 7.328* 02/29/2016 2058   HCO3  19.9* 02/29/2016 2058   TCO2 23 03/01/2016 1618   ACIDBASEDEF 6.0* 02/29/2016 2058   O2SAT 98.0 02/29/2016 2058   CBG (last 3)   Recent Labs  03/01/16 1935 03/01/16 2326 03/02/16 0447  GLUCAP 184* 113* 124*   CLINICAL DATA: Patient status post CABG procedure.  EXAM: PORTABLE CHEST 1 VIEW  COMPARISON: Chest radiograph 03/01/2016.  FINDINGS: Right IJ sheath projects over the superior vena cava. Interval retraction PA catheter. Interval removal of left chest tube and mediastinal drain. Monitoring leads overlie the patient. Cervical spinal fusion hardware. Stable cardiac mediastinal contours. Minimal heterogeneous opacities left lung base. No pleural effusion or pneumothorax.  IMPRESSION: Minimal left basilar atelectasis.   Electronically Signed  By: Annia Beltrew Davis M.D.  On: 03/02/2016 08:54  Assessment/Plan: S/P Procedure(s) (LRB): CORONARY ARTERY BYPASS GRAFTING (CABG), ON PUMP, TIMES THREE, USING LEFT INTERNAL MAMMARY ARTERY, RIGHT GREATER SAPHENOUS VEIN HARVESTED ENDOSCOPICALLY (N/A) TRANSESOPHAGEAL ECHOCARDIOGRAM (TEE) (N/A)  He has been hemodynamically stable in sinus rhythm. Continue BB  Mild volume excess: weight is 6 lbs over preop. Continue diuretic  DM: glucose is under reasonable control. Will start Metformin and stop Levemir and continue SSI.   Remove sleeve and transfer to 2W     LOS: 4 days    Alleen BorneBryan K Romell Wolden 03/02/2016

## 2016-03-03 ENCOUNTER — Encounter (HOSPITAL_COMMUNITY): Payer: Self-pay | Admitting: Surgery

## 2016-03-03 LAB — BASIC METABOLIC PANEL
Anion gap: 9 (ref 5–15)
BUN: 12 mg/dL (ref 6–20)
CALCIUM: 8.9 mg/dL (ref 8.9–10.3)
CHLORIDE: 101 mmol/L (ref 101–111)
CO2: 25 mmol/L (ref 22–32)
CREATININE: 0.75 mg/dL (ref 0.61–1.24)
GFR calc Af Amer: >60 mL/min (ref >60)
GFR calc non Af Amer: >60 mL/min (ref >60)
Glucose, Bld: 143 mg/dL — ABNORMAL HIGH (ref 65–99)
Potassium: 3.9 mmol/L (ref 3.5–5.1)
SODIUM: 135 mmol/L (ref 135–145)

## 2016-03-03 LAB — CBC
HEMATOCRIT: 29.8 % — AB (ref 39.0–52.0)
HEMOGLOBIN: 9.6 g/dL — AB (ref 13.0–17.0)
MCH: 26.3 pg (ref 26.0–34.0)
MCHC: 32.2 g/dL (ref 30.0–36.0)
MCV: 81.6 fL (ref 78.0–100.0)
Platelets: 246 10*3/uL (ref 150–400)
RBC: 3.65 MIL/uL — AB (ref 4.22–5.81)
RDW: 14.3 % (ref 11.5–15.5)
WBC: 11.4 10*3/uL — ABNORMAL HIGH (ref 4.0–10.5)

## 2016-03-03 LAB — GLUCOSE, CAPILLARY
GLUCOSE-CAPILLARY: 114 mg/dL — AB (ref 65–99)
Glucose-Capillary: 150 mg/dL — ABNORMAL HIGH (ref 65–99)
Glucose-Capillary: 125 mg/dL — ABNORMAL HIGH (ref 65–99)
Glucose-Capillary: 130 mg/dL — ABNORMAL HIGH (ref 65–99)

## 2016-03-03 MED ORDER — LIVING WELL WITH DIABETES BOOK
Freq: Once | Status: AC
  Administered 2016-03-03: 1
  Filled 2016-03-03: qty 1

## 2016-03-03 MED ORDER — LIVING WELL WITH DIABETES BOOK
Freq: Once | Status: AC
  Filled 2016-03-03: qty 1

## 2016-03-03 MED FILL — Electrolyte-R (PH 7.4) Solution: INTRAVENOUS | Qty: 4000 | Status: AC

## 2016-03-03 MED FILL — Sodium Chloride IV Soln 0.9%: INTRAVENOUS | Qty: 2000 | Status: AC

## 2016-03-03 MED FILL — Sodium Bicarbonate IV Soln 8.4%: INTRAVENOUS | Qty: 50 | Status: AC

## 2016-03-03 MED FILL — Mannitol IV Soln 20%: INTRAVENOUS | Qty: 500 | Status: AC

## 2016-03-03 MED FILL — Heparin Sodium (Porcine) Inj 1000 Unit/ML: INTRAMUSCULAR | Qty: 10 | Status: AC

## 2016-03-03 MED FILL — Lidocaine HCl IV Inj 20 MG/ML: INTRAVENOUS | Qty: 5 | Status: AC

## 2016-03-03 NOTE — Care Management Note (Signed)
Case Management Note Previous CM note initiated by Raynald BlendSamantha Claxton RN, CM  Patient Details  Name: Tyler Russell MRN: [id number] Date of Birth: Jul 12, 1956  Subjective/Objective:  Pt admitted with STEMI - planned CABG 02/29/16                  Action/Plan:  PTA independent from home with wife.  Wife will be with pt post discharge for recommended time.  Wife raised concerns with pt discharging home; wife has multiple health concerns as well however completely functional ; CM assured wife that pt will be assessed prior to discharge for an needs and that there recommendation for 24/7 supervision is only to be able to call for help if needed.  CM will continue to monitor for disposition needs    Expected Discharge Date:                  Expected Discharge Plan:  Home/Self Care  In-House Referral:     Discharge planning Services  CM Consult  Post Acute Care Choice:    Choice offered to:     DME Arranged:    DME Agency:     HH Arranged:    HH Agency:     Status of Service:  In process, will continue to follow  Medicare Important Message Given:  Yes Date Medicare IM Given:    Medicare IM give by:    Date Additional Medicare IM Given:    Additional Medicare Important Message give by:     If discussed at Long Length of Stay Meetings, dates discussed:    Additional Comments:  03/03/16- 1530- Donn PieriniKristi Dedee Liss RN BSN- received call from pt's wife- Mrs. Jain- regarding d/c concerns- pt's wife has concerns regarding being able to provide the needed assistance to pt at discharge due to her own health problems- pt walked 600 ft today with cardiac rehab- would not really qualify for STSNF- pt's wife requesting that pt and her be "shown" what to do not just explained what is needed -when it comes to the d/c education for discharge- [facility] with disease management-DM and education.  Pt would also like a new PCP- explained [facility] to wife and will place phone# on  AVS- wife also requesting f/u with [place] cardiologist - Dr. Arnoldo HookerBruce Kowalski if possible- have left sticky note for MD-- CM will f/u with cardiac rehab tomorrow regarding educational needs for discharge.  Darrold SpanWebster, Taher Vannote Hall, RN 03/03/2016, 3:29 PM

## 2016-03-03 NOTE — Progress Notes (Signed)
CARDIAC REHAB PHASE I   PRE:  Rate/Rhythm: 110 ST  BP:  Sitting: 129/68        SaO2: 97 RA  MODE:  Ambulation: 600 ft   POST:  Rate/Rhythm: 114 ST  BP:  Sitting: 137/71         SaO2: 96 RA  Pt fairly independent, did need verbal reminder cues for sternal precautions. Pt ambulated 600 ft on RA, hand held assist, steady gait, tolerated well with no complaints, VSS. Encouraged additional ambulation x2 today, IS. Pt to chair after walk, wife at bedside, call bell within reach. Will follow.  [phone] Joylene GrapesEmily C Taishaun Levels, RN, BSN 03/03/2016 9:10 AM

## 2016-03-03 NOTE — Progress Notes (Signed)
3 Days Post-Op Procedure(s) (LRB): CORONARY ARTERY BYPASS GRAFTING (CABG), ON PUMP, TIMES THREE, USING LEFT INTERNAL MAMMARY ARTERY, RIGHT GREATER SAPHENOUS VEIN HARVESTED ENDOSCOPICALLY (N/A) TRANSESOPHAGEAL ECHOCARDIOGRAM (TEE) (N/A) Subjective:  No complaints  Objective: Vital signs in last 24 hours: Temp:  [97.7 F (36.5 C)-98 F (36.7 C)] 97.7 F (36.5 C) (06/19 0500) Pulse Rate:  [98-117] 101 (06/19 0500) Cardiac Rhythm:  [-] Normal sinus rhythm (06/19 0700) Resp:  [17-23] 18 (06/19 0500) BP: (121-159)/(68-88) 124/75 mmHg (06/19 0500) SpO2:  [91 %-98 %] 91 % (06/19 0500) Weight:  [73.755 kg (162 lb 9.6 oz)] 73.755 kg (162 lb 9.6 oz) (06/19 0400)  Hemodynamic parameters for last 24 hours:    Intake/Output from previous day: 06/18 0701 - 06/19 0700 In: 980 [P.O.:960; I.V.:20] Out: 2825 [Urine:2825] Intake/Output this shift:    General appearance: alert and cooperative Heart: regular rate and rhythm, S1, S2 normal, no murmur, click, rub or gallop Lungs: clear to auscultation bilaterally Extremities: extremities normal, atraumatic, no cyanosis or edema Wound: incisions ok  Lab Results:  Recent Labs  03/02/16 0413 03/03/16 0226  WBC 9.2 11.4*  HGB 9.2* 9.6*  HCT 28.6* 29.8*  PLT 186 246   BMET:  Recent Labs  03/02/16 0413 03/03/16 0226  NA 133* 135  K 3.9 3.9  CL 102 101  CO2 25 25  GLUCOSE 138* 143*  BUN 8 12  CREATININE 0.68 0.75  CALCIUM 8.2* 8.9    PT/INR:  Recent Labs  02/29/16 1610  LABPROT 17.0*  INR 1.37   ABG    Component Value Date/Time   PHART 7.328* 02/29/2016 2058   HCO3 19.9* 02/29/2016 2058   TCO2 23 03/01/2016 1618   ACIDBASEDEF 6.0* 02/29/2016 2058   O2SAT 98.0 02/29/2016 2058   CBG (last 3)   Recent Labs  03/02/16 1622 03/02/16 2057 03/03/16 0652  GLUCAP 151* 163* 150*    Assessment/Plan: S/P Procedure(s) (LRB): CORONARY ARTERY BYPASS GRAFTING (CABG), ON PUMP, TIMES THREE, USING LEFT INTERNAL MAMMARY ARTERY,  RIGHT GREATER SAPHENOUS VEIN HARVESTED ENDOSCOPICALLY (N/A) TRANSESOPHAGEAL ECHOCARDIOGRAM (TEE) (N/A)  He is hemodynamically stable in sinus rhythm.  Weight is back to baseline so will stop diuretic.  Diabetes: glucose under adequate control at this time. Metformin started. Will need diabetes education.  Continue IS and ambulation.   Plan home tomorrow if no changes.   LOS: 5 days    Alleen BorneBryan K Bartle 03/03/2016

## 2016-03-03 NOTE — Progress Notes (Signed)
Utilization review completed.  

## 2016-03-03 NOTE — Care Management Important Message (Signed)
Important Message  Patient Details  Name: Tyler KindredFreddie L Geffre MRN: [id number] Date of Birth: 18-Apr-1956   Medicare Important Message Given:  Yes    Kyla BalzarineShealy, Khyren Hing Abena 03/03/2016, 11:16 AM

## 2016-03-03 NOTE — Care Management Important Message (Signed)
Important Message  Patient Details  Name: Tyler Russell MRN: [id number] Date of Birth: [birth year]   Medicare Important Message Given:  Yes    [name] 03/03/2016, 11:13 AM

## 2016-03-03 NOTE — Progress Notes (Signed)
Inpatient Diabetes Program Recommendations  AACE/ADA: New Consensus Statement on Inpatient Glycemic Control (2015)  Target Ranges:  Prepandial:   less than 140 mg/dL      Peak postprandial:   less than 180 mg/dL (1-2 hours)      Critically ill patients:  140 - 180 mg/dL   Lab Results  Component Value Date   GLUCAP 150* 03/03/2016   HGBA1C 8.7* 02/27/2016    Review of Glycemic Control  Diabetes history: No prior DM dx Current orders for Inpatient glycemic control: Novolog correction 0-24 units tid with meals + Metformin 850 mg bid  Inpatient Diabetes Program Recommendations: Spoke with pt about new diagnosis. Discussed A1C results with them and explained what an A1C is, basic pathophysiology of DM Type 2, basic home care, basic diabetes diet nutrition principles, importance of checking CBGs and maintaining good CBG control to prevent long-term and short-term complications. Also reviewed blood sugar goals at home.  RNs to provide ongoing basic DM education at bedside with this patient. Have ordered educational booklet, and DM videos. Have also placed RD consult for DM diet education for this patient. Spoke with RN Hansel Starlingdrienne to review education plan of care. Patient will need order on discharge for meter and strips (order #[id number]) and [facility]. Patient and wife prefers one on one education if available. Patient and wife also request physicians to refer to new PCP on discharge.  Thank you, Billy FischerJudy E. Basim Bartnik, RN, MSN, CDE Inpatient Glycemic Control Team Team Pager [phone] (8am-5pm) 03/03/2016 11:20 AM

## 2016-03-04 LAB — GLUCOSE, CAPILLARY
GLUCOSE-CAPILLARY: 121 mg/dL — AB (ref 65–99)
Glucose-Capillary: 114 mg/dL — ABNORMAL HIGH (ref 65–99)

## 2016-03-04 MED ORDER — ASPIRIN 325 MG PO TBEC: 325.0000 mg | DELAYED_RELEASE_TABLET | Freq: Every day | ORAL | Status: DC

## 2016-03-04 MED ORDER — LISINOPRIL 2.5 MG PO TABS: 2.5000 mg | ORAL_TABLET | Freq: Every day | ORAL | Status: DC

## 2016-03-04 MED ORDER — TRAMADOL HCL 50 MG PO TABS: 50.0000 mg | ORAL_TABLET | ORAL | Status: DC | PRN

## 2016-03-04 MED ORDER — METFORMIN HCL 850 MG PO TABS: 850.0000 mg | ORAL_TABLET | Freq: Two times a day (BID) | ORAL | Status: DC

## 2016-03-04 MED ORDER — BLOOD GLUCOSE METER KIT: PACK | Status: AC

## 2016-03-04 MED ORDER — METOPROLOL TARTRATE 50 MG PO TABS: 50.0000 mg | ORAL_TABLET | Freq: Two times a day (BID) | ORAL | Status: DC

## 2016-03-04 MED ORDER — ATORVASTATIN CALCIUM 40 MG PO TABS: 40.0000 mg | ORAL_TABLET | Freq: Every day | ORAL | Status: DC

## 2016-03-04 NOTE — Discharge Summary (Signed)
Physician Discharge Summary  Patient ID: ELRIDGE STEMM MRN: [id number] DOB/AGE: 1955/10/30 60 y.o.  Admit date: 02/27/2016 Discharge date: 03/04/2016  Admission Diagnoses:  Patient Active Problem List   Diagnosis Date Noted  . Non-ST elevated myocardial infarction (non-STEMI) ([place]) 02/27/2016  . NSTEMI (non-ST elevated myocardial infarction) ([place]) 02/26/2016   Discharge Diagnoses:  Active Problems:   Non-ST elevated myocardial infarction (non-STEMI) ([facility])   S/P CABG x 3  Discharged Condition: good  History of Present Illness:  [name] is a 60 yo male who presented to [facility] for complaints of chest pain.  The patient's symptoms developed on 02/26/2016.  However he had a few episodes of exertional chest pressure the days prior which he felt was reflux symptoms.  He was recently diagnosed with a hiatal hernia with gastritis/esophagtitis and is taking Prilosec for this.  On evaluation in the ED his initial Troponin level was negative, however the subsequent levels became positive.  Therefore, he was admitted for further evaluation and treatment.  Cardiology consult was obtained and ruled the patient in for NSTEMI.  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  He was transferred to [facility] for further evaluation.      Hospital Course:   Upon arrival the patient was chest pain free.  He was evaluated by Dr. Cyndia Bent who was in agreement the patient would require coronary bypass grafting procedure.  The risks and benefits of the procedure were explained to the patient and he was agreeable to proceed.  He was taken to the operating room on 02/29/2016.  He underwent CABG x 3 utilizing LIMA to LAD, SVG to Ramus Intermediate, and SVG to OM.  He also underwent Endoscopic harvest of greater saphenous vein from the right leg.  He tolerated the procedure without difficulty and was taken to the SICU in stable condition.  The patient was extubated the  evening of surgery.  During his stay in the SICU the patient progressed without difficulty.  He is a new diabetic and was weaned off insulin drip as tolerated.  He was started on Metformin for glycemic control.  Diabetes education was completed and the patient and his wife requested more one on one counseling in an outpatient setting.  His chest tubes and arterial lines were removed without difficulty.  He was ambulating without assistance.  He is maintaining NSR and felt medically stable for transfer to the telemetry unit.  The patient continued to make progress.  His pacing wires were removed without difficulty.  He continues to ambulate independently.  His pain is well controlled.  He is tolerating a diabetic diet.  He is felt medically stable for discharge home today.  Significant Diagnostic Studies: angiography:   1. LM lesion, 99% stenosed. 2. Ost LM lesion, 80% stenosed. 3. Ost Cx to Prox Cx lesion, 85% stenosed. 4. Ost Ramus to Ramus lesion, 80% stenosed. 5. Mid Cx lesion, 50% stenosed. 6. Prox LAD to Mid LAD lesion, 70% stenosed. 7. Prox RCA to Mid RCA lesion, 80% stenosed. 8. Mid RCA lesion, 85% stenosed  Treatments: surgery:   9. Median Sternotomy 10. Extracorporeal circulation 3. Coronary artery bypass grafting x 3   Left internal mammary graft to the LAD  SVG to Ramus  SVG to OM   4. Endoscopic vein harvest from the right leg  Disposition:  Home  Discharge Medications:  The patient has been discharged on:   1.Beta Blocker:  Yes [ x  ]  No   [   ]                              If No, reason:  2.Ace Inhibitor/ARB: Yes [ x  ]                                     No  [    ]                                     If No, reason:  3.Statin:   Yes [x   ]                  No  [   ]                  If No, reason:  4.Ecasa:  Yes  [ x  ]                  No   [   ]                  If No, reason:         Discharge Instructions     Ambulatory referral to Nutrition and Diabetic Education    Complete by:  As directed   New onset. Post CABG     Ambulatory referral to Nutrition and Diabetic Education    Complete by:  As directed   New onset post CABG. Requests one on one instruction.            Medication List    TAKE these medications        aspirin 325 MG EC tablet  Take 1 tablet (325 mg total) by mouth daily.     atorvastatin 40 MG tablet  Commonly known as:  LIPITOR  Take 1 tablet (40 mg total) by mouth daily at 6 PM.     blood glucose meter kit and supplies  Dispense based on patient and insurance preference. Use up to four times daily as directed. (FOR ICD-9 250.00, 250.01).     gemfibrozil 600 MG tablet  Commonly known as:  LOPID  Take 600 mg by mouth 2 (two) times daily before a meal.     lisinopril 2.5 MG tablet  Commonly known as:  ZESTRIL  Take 1 tablet (2.5 mg total) by mouth daily.     metFORMIN 850 MG tablet  Commonly known as:  GLUCOPHAGE  Take 1 tablet (850 mg total) by mouth 2 (two) times daily with a meal.     metoprolol 50 MG tablet  Commonly known as:  LOPRESSOR  Take 1 tablet (50 mg total) by mouth 2 (two) times daily.     omeprazole 20 MG capsule  Commonly known as:  PRILOSEC  Take 20 mg by mouth daily.     traMADol 50 MG tablet  Commonly known as:  ULTRAM  Take 1-2 tablets (50-100 mg total) by mouth every 4 (four) hours as needed for moderate pain.       Follow-up Information    Follow up with [facility].   Contact information:   Please call [phone]- for physician referral list assistance in finding a new primary care doctor.  Follow up with Gaye Pollack, MD On 04/02/2016.   Specialty:  Cardiothoracic Surgery   Why:  Appointment is at 10:00   Contact information:   [address] [place] [address] [phone]       Follow up with [facility] On 04/02/2016.   Why:  Please get CXR 30 min prior to your appointment with  Dr. Lyn Henri information:   [facility]       Follow up with Corey Skains, MD.   Specialty:  Internal Medicine   Why:  contact office to set up follow up appointment   Contact information:   [place] [address] [phone]       Follow up with [facility].   Why:  HH-RN/PT arranged- they will contact you to set up home visits   Contact information:   [address] [place] [address] [phone]       Follow up with [facility] On 03/11/2016.   Specialty:  Cardiothoracic Surgery   Why:  Appointment is a 11:00, For suture removal   Contact information:   [address] [place], [place] [phone]      Signed: Ellwood Handler 03/04/2016, 1:36 PM

## 2016-03-04 NOTE — Progress Notes (Signed)
Patient education given for removal of pacer wires patient verbalised understanding, 4 pacer wires pulled as ordered, will continue to monitor

## 2016-03-04 NOTE — Discharge Instructions (Signed)
Coronary Artery Bypass Grafting, Care After °Refer to this sheet in the next few weeks. These instructions provide you with information on caring for yourself after your procedure. Your health care provider may also give you more specific instructions. Your treatment has been planned according to current medical practices, but problems sometimes occur. Call your health care provider if you have any problems or questions after your procedure. °WHAT TO EXPECT AFTER THE PROCEDURE °Recovery from surgery will be different for everyone. Some people feel well after 3 or 4 weeks, while for others it takes longer. After your procedure, it is typical to have the following: °· Nausea and a lack of appetite.   °· Constipation. °· Weakness and fatigue.   °· Depression or irritability.   °· Pain or discomfort at your incision site. °HOME CARE INSTRUCTIONS °· Take medicines only as directed by your health care provider. Do not stop taking medicines or start any new medicines without first checking with your health care provider. °· Take your pulse as directed by your health care provider. °· Perform deep breathing as directed by your health care provider. If you were given a device called an incentive spirometer, use it to practice deep breathing several times a day. Support your chest with a pillow or your arms when you take deep breaths or cough. °· Keep incision areas clean, dry, and protected. Remove or change any bandages (dressings) only as directed by your health care provider. You may have skin adhesive strips over the incision areas. Do not take the strips off. They will fall off on their own. °· Check incision areas daily for any swelling, redness, or drainage. °· If incisions were made in your legs, do the following: °¨ Avoid crossing your legs.   °¨ Avoid sitting for long periods of time. Change positions every 30 minutes.   °¨ Elevate your legs when you are sitting. °· Wear compression stockings as directed by your  health care provider. These stockings help keep blood clots from forming in your legs. °· Take showers once your health care provider approves. Until then, only take sponge baths. Pat incisions dry. Do not rub incisions with a washcloth or towel. Do not take baths, swim, or use a hot tub until your health care provider approves. °· Eat foods that are high in fiber, such as raw fruits and vegetables, whole grains, beans, and nuts. Meats should be lean cut. Avoid canned, processed, and fried foods. °· Drink enough fluid to keep your urine clear or pale yellow. °· Weigh yourself every day. This helps identify if you are retaining fluid that may make your heart and lungs work harder. °· Rest and limit activity as directed by your health care provider. You may be instructed to: °¨ Stop any activity at once if you have chest pain, shortness of breath, irregular heartbeats, or dizziness. Get help right away if you have any of these symptoms. °¨ Move around frequently for short periods or take short walks as directed by your health care provider. Increase your activities gradually. You may need physical therapy or cardiac rehabilitation to help strengthen your muscles and build your endurance. °¨ Avoid lifting, pushing, or pulling anything heavier than 10 lb (4.5 kg) for at least 6 weeks after surgery. °· Do not drive until your health care provider approves.  °· Ask your health care provider when you may return to work. °· Ask your health care provider when you may resume sexual activity. °· Keep all follow-up visits as directed by your health care   provider. This is important. °SEEK MEDICAL CARE IF: °· You have swelling, redness, increasing pain, or drainage at the site of an incision. °· You have a fever. °· You have swelling in your ankles or legs. °· You have pain in your legs.   °· You gain 2 or more pounds (0.9 kg) a day. °· You are nauseous or vomit. °· You have diarrhea.  °SEEK IMMEDIATE MEDICAL CARE IF: °· You have  chest pain that goes to your jaw or arms. °· You have shortness of breath.   °· You have a fast or irregular heartbeat.   °· You notice a "clicking" in your breastbone (sternum) when you move.   °· You have numbness or weakness in your arms or legs. °· You feel dizzy or light-headed.   °MAKE SURE YOU: °· Understand these instructions. °· Will watch your condition. °· Will get help right away if you are not doing well or get worse. °  °This information is not intended to replace advice given to you by your health care provider. Make sure you discuss any questions you have with your health care provider. °  °Document Released: 03/21/2005 Document Revised: 09/22/2014 Document Reviewed: 02/08/2013 °Elsevier Interactive Patient Education ©2016 [employer] ° °Endoscopic Saphenous Vein Harvesting, Care After °Refer to this sheet in the next few weeks. These instructions provide you with information on caring for yourself after your procedure. Your health care provider may also give you more specific instructions. Your treatment has been planned according to current medical practices, but problems sometimes occur. Call your health care provider if you have any problems or questions after your procedure. °HOME CARE INSTRUCTIONS °Medicine °· Take whatever pain medicine your surgeon prescribes. Follow the directions carefully. Do not take over-the-counter pain medicine unless your surgeon says it is okay. Some pain medicine can cause bleeding problems for several weeks after surgery. °· Follow your surgeon's instructions about driving. You will probably not be permitted to drive after heart surgery. °· Take any medicines your surgeon prescribes. Any medicines you took before your heart surgery should be checked with your health care provider before you start taking them again. °Wound care °· If your surgeon has prescribed an elastic bandage or stocking, ask how long you should wear it. °· Check the area around your surgical  cuts (incisions) whenever your bandages (dressings) are changed. Look for any redness or swelling. °· You will need to return to have the stitches (sutures) or staples taken out. Ask your surgeon when to do that. °· Ask your surgeon when you can shower or bathe. °Activity °· Try to keep your legs raised when you are sitting. °· Do any exercises your health care providers have given you. These may include deep breathing exercises, coughing, walking, or other exercises. °SEEK MEDICAL CARE IF: °· You have any questions about your medicines. °· You have more leg pain, especially if your pain medicine stops working. °· New or growing bruises develop on your leg. °· Your leg swells, feels tight, or becomes red. °· You have numbness in your leg. °SEEK IMMEDIATE MEDICAL CARE IF: °· Your pain gets much worse. °· Blood or fluid leaks from any of the incisions. °· Your incisions become warm, swollen, or red. °· You have chest pain. °· You have trouble breathing. °· You have a fever. °· You have more pain near your leg incision. °MAKE SURE YOU: °· Understand these instructions. °· Will watch your condition. °· Will get help right away if you are not doing well or   get worse. °  °This information is not intended to replace advice given to you by your health care provider. Make sure you discuss any questions you have with your health care provider. °  °Document Released: 05/14/2011 Document Revised: 09/22/2014 Document Reviewed: 05/14/2011 °Elsevier Interactive Patient Education ©2016 [employer] Inc. ° ° °

## 2016-03-04 NOTE — Progress Notes (Signed)
Patient in stable state, this RN went over discharge instructions with patient and family, prescriptions given to patient,they verbalised understanding, iv removed, cardiac monitor dc, ccmd notified, patient taken off the unit on a wheelchair

## 2016-03-04 NOTE — Progress Notes (Signed)
Nutrition Consult/Brief Note  RD consulted for nutrition education regarding diabetes.   Lab Results  Component Value Date   HGBA1C 8.7* 02/27/2016    RD provided "Carbohydrate Counting for People with Diabetes" handout from the Academy of Nutrition [employer] Dietetics.  Pt opted to review information on his own time >> declined discussion with RD.  Body mass index is 26.73 kg/(m^2). Pt meets criteria for Overweight based on current BMI.  Current diet order is Heart Healthy/Carbohydrate Modified, patient is consuming approximately 100% of meals at this time. Labs and medications reviewed.   No further nutrition interventions warranted at this time.  If additional nutrition issues arise, please re-consult RD.  Tyler Russell, RD, LDN Pager #: [phone] After-Hours Pager #: [phone]

## 2016-03-04 NOTE — Progress Notes (Signed)
CARDIAC REHAB PHASE I   Ed completed with pt and wife. Good questions asked. Will send referral to [facility]. Set up d/c video.  [phone]  Harriet MassonRandi Kristan Lynsee Wands CES, ACSM 03/04/2016 11:21 AM

## 2016-03-04 NOTE — Progress Notes (Addendum)
      [address]       [place] [id number]             [phone]      4 Days Post-Op Procedure(s) (LRB): CORONARY ARTERY BYPASS GRAFTING (CABG), ON PUMP, TIMES THREE, USING LEFT INTERNAL MAMMARY ARTERY, RIGHT GREATER SAPHENOUS VEIN HARVESTED ENDOSCOPICALLY (N/A) TRANSESOPHAGEAL ECHOCARDIOGRAM (TEE) (N/A)   Subjective:  [name] has no complaints.  He is ready to go home today.  + ambulation   + BM  Objective: Vital signs in last 24 hours: Temp:  [97.8 F (36.6 C)-98.2 F (36.8 C)] 98.2 F (36.8 C) (06/20 0605) Pulse Rate:  [100-109] 100 (06/20 0605) Cardiac Rhythm:  [-] Sinus tachycardia (06/19 1920) Resp:  [18] 18 (06/20 0605) BP: (121-151)/(67-82) 121/67 mmHg (06/20 0605) SpO2:  [94 %] 94 % (06/20 0605) Weight:  [160 lb 9.6 oz (72.848 kg)] 160 lb 9.6 oz (72.848 kg) (06/20 0605)  Intake/Output from previous day: 06/19 0701 - 06/20 0700 In: -  Out: 1100 [Urine:1100]  General appearance: alert, cooperative and no distress Heart: regular rate and rhythm Lungs: clear to auscultation bilaterally Abdomen: soft, non-tender; bowel sounds normal; no masses,  no organomegaly Extremities: edema trace Wound: clean and dry, ecchymosis RLE  Lab Results:  Recent Labs  03/02/16 0413 03/03/16 0226  WBC 9.2 11.4*  HGB 9.2* 9.6*  HCT 28.6* 29.8*  PLT 186 246   BMET:  Recent Labs  03/02/16 0413 03/03/16 0226  NA 133* 135  K 3.9 3.9  CL 102 101  CO2 25 25  GLUCOSE 138* 143*  BUN 8 12  CREATININE 0.68 0.75  CALCIUM 8.2* 8.9    PT/INR: No results for input(s): LABPROT, INR in the last 72 hours. ABG    Component Value Date/Time   PHART 7.328* 02/29/2016 2058   HCO3 19.9* 02/29/2016 2058   TCO2 23 03/01/2016 1618   ACIDBASEDEF 6.0* 02/29/2016 2058   O2SAT 98.0 02/29/2016 2058   CBG (last 3)   Recent Labs  03/03/16 1707 03/03/16 2125 03/04/16 0603  GLUCAP 130* 114* 121*    Assessment/Plan: S/P Procedure(s) (LRB): CORONARY ARTERY  BYPASS GRAFTING (CABG), ON PUMP, TIMES THREE, USING LEFT INTERNAL MAMMARY ARTERY, RIGHT GREATER SAPHENOUS VEIN HARVESTED ENDOSCOPICALLY (N/A) TRANSESOPHAGEAL ECHOCARDIOGRAM (TEE) (N/A)  1. CV- maintaining NSR- continue Lopressor, will add low dose ACE for HTN 2. Pulm- no acute issues, continue IS 3. Renal- creatinine WNL, weight trending down he is below baseline not currently on Lasix 4. DM- diabetes education complete, will discharge on Metformin, plan for outpatient follow up 5. Dispo- patient stable, d/c EPW today, plan for d/c later today if remains stable   LOS: 6 days    Lowella DandyBARRETT, ERIN 03/04/2016   Chart reviewed, patient examined, agree with above.

## 2016-03-04 NOTE — Care Management Note (Addendum)
Case Management Note Previous CM note initiated by Raynald BlendSamantha Claxton RN, CM  Patient Details  Name: Tyler KindredFreddie L Marcinek MRN: [id number] Date of Birth: Aug 03, 1956  Subjective/Objective:  Pt admitted with STEMI - planned CABG 02/29/16                  Action/Plan:  PTA independent from home with wife.  Wife will be with pt post discharge for recommended time.  Wife raised concerns with pt discharging home; wife has multiple health concerns as well however completely functional ; CM assured wife that pt will be assessed prior to discharge for an needs and that there recommendation for 24/7 supervision is only to be able to call for help if needed.  CM will continue to monitor for disposition needs    Expected Discharge Date:                  Expected Discharge Plan:  Home w Home Health Services  In-House Referral:     Discharge planning Services  CM Consult  Post Acute Care Choice:  Home Health Choice offered to:  Spouse  DME Arranged:  N/A DME Agency:  NA  HH Arranged:  RN, PT, Disease Management HH Agency:  [facility]  Status of Service:  Completed, signed off  Medicare Important Message Given:  Yes Date Medicare IM Given:    Medicare IM give by:    Date Additional Medicare IM Given:    Additional Medicare Important Message give by:     If discussed at Long Length of Stay Meetings, dates discussed:    Discharge Disposition: Home/home health   Additional Comments:  Alternate contact for home #- [phone]  03/04/16- 1115- Donn PieriniKristi Chioke Noxon RN, BSN- pt for d/c home today with wife- orders have been placed for HHRN/PT- spoke with pt and wife at bedside- [facility] [place]- per wife [facility]- [facility]-pt will need disease management for new DM and f/u education for CABG. Pt and wife had no further d/c needs or questions for CM at this time.   03/03/16- 1530- Donn PieriniKristi  Nicolena Schurman RN BSN- received call from pt's wife- Mrs. Breach- regarding d/c concerns- pt's wife has concerns regarding being able to provide the needed assistance to pt at discharge due to her own health problems- pt walked 600 ft today with cardiac rehab- would not really qualify for STSNF- pt's wife requesting that pt and her be "shown" what to do not just explained what is needed -when it comes to the d/c education for discharge- [facility] disease management-DM and education.  Pt would also like a new PCP- explained [facility] to wife and will place phone# on AVS- wife also requesting f/u with [place] cardiologist - Dr. Arnoldo HookerBruce Kowalski if possible- have left sticky note for MD-- CM will f/u with cardiac rehab tomorrow regarding educational needs for discharge.  Darrold SpanWebster, Alexx Giambra Hall, RN 03/04/2016, 11:37 AM

## 2016-03-06 ENCOUNTER — Telehealth: Payer: Self-pay | Admitting: *Deleted

## 2016-03-06 NOTE — Telephone Encounter (Signed)
Mr. Izora RibasColey is s/p CABG, discharged on 03/05/15.  He says that he had diarrhea and nausea in the hospital and it is continuing.  He gags when he tries to eat and he is having diarrhea of a yellow liquid nature around six times a day. I called Dr. Laneta SimmersBartle to relate these issues. He advised to stop two new meds he had been prescribed.Marland Kitchen.Marland Kitchen.Lipitor and Metformin. He and his wife agreed.

## 2016-03-11 ENCOUNTER — Ambulatory Visit (INDEPENDENT_AMBULATORY_CARE_PROVIDER_SITE_OTHER): Payer: Self-pay

## 2016-03-11 DIAGNOSIS — Z4802 Encounter for removal of sutures: Secondary | ICD-10-CM

## 2016-03-11 DIAGNOSIS — Z951 Presence of aortocoronary bypass graft: Secondary | ICD-10-CM

## 2016-03-11 NOTE — Progress Notes (Signed)
Removed 3 sutures from chest tube sites with no signs of infection and patient tolerated well. 

## 2016-03-26 DIAGNOSIS — R0602 Shortness of breath: Secondary | ICD-10-CM | POA: Diagnosis not present

## 2016-03-26 DIAGNOSIS — I2510 Atherosclerotic heart disease of native coronary artery without angina pectoris: Secondary | ICD-10-CM | POA: Diagnosis not present

## 2016-03-26 DIAGNOSIS — E1165 Type 2 diabetes mellitus with hyperglycemia: Secondary | ICD-10-CM | POA: Diagnosis not present

## 2016-03-26 DIAGNOSIS — Z23 Encounter for immunization: Secondary | ICD-10-CM | POA: Diagnosis not present

## 2016-03-26 DIAGNOSIS — E782 Mixed hyperlipidemia: Secondary | ICD-10-CM | POA: Diagnosis not present

## 2016-03-31 ENCOUNTER — Encounter: Payer: Medicare Other | Attending: Surgery | Admitting: *Deleted

## 2016-03-31 VITALS — Ht 66.5 in | Wt 152.0 lb

## 2016-03-31 DIAGNOSIS — I214 Non-ST elevation (NSTEMI) myocardial infarction: Secondary | ICD-10-CM

## 2016-03-31 DIAGNOSIS — Z951 Presence of aortocoronary bypass graft: Secondary | ICD-10-CM | POA: Diagnosis not present

## 2016-03-31 NOTE — Progress Notes (Signed)
Cardiac Individual Treatment Plan  Patient Details  Name: Tyler Russell MRN: [id number] Date of Birth: [birth year] Referring Provider:        Cardiac Rehab from 03/31/2016 in [facility] Cardiac and Pulmonary Rehab   Referring Provider  Bartle      Initial Encounter Date:       Cardiac Rehab from 03/31/2016 in [facility] Cardiac and Pulmonary Rehab   Date  03/31/16   Referring Provider  Bartle      Visit Diagnosis: NSTEMI (non-ST elevated myocardial infarction) ([place])  S/P CABG x 3  Patient's Home Medications on Admission:  Current outpatient prescriptions:  .  aspirin 81 MG tablet, Take 81 mg by mouth daily., Disp: , Rfl:  .  gemfibrozil (LOPID) 600 MG tablet, Take 600 mg by mouth 2 (two) times daily before a meal., Disp: , Rfl:  .  metFORMIN (GLUCOPHAGE) 850 MG tablet, Take 1 tablet (850 mg total) by mouth 2 (two) times daily with a meal., Disp: 60 tablet, Rfl: 3 .  metoprolol (LOPRESSOR) 50 MG tablet, Take 1 tablet (50 mg total) by mouth 2 (two) times daily., Disp: 60 tablet, Rfl: 3 .  omeprazole (PRILOSEC) 20 MG capsule, Take 20 mg by mouth daily. , Disp: , Rfl:  .  aspirin EC 325 MG EC tablet, Take 1 tablet (325 mg total) by mouth daily. (Patient not taking: Reported on 03/31/2016), Disp: 30 tablet, Rfl: 0 .  atorvastatin (LIPITOR) 40 MG tablet, Take 1 tablet (40 mg total) by mouth daily at 6 PM. (Patient not taking: Reported on 03/31/2016), Disp: 30 tablet, Rfl: 3 .  blood glucose meter kit and supplies, Dispense based on patient and insurance preference. Use up to four times daily as directed. (FOR ICD-9 250.00, 250.01)., Disp: 1 each, Rfl: 0 .  lisinopril (ZESTRIL) 2.5 MG tablet, Take 1 tablet (2.5 mg total) by mouth daily., Disp: 30 tablet, Rfl: 3 .  traMADol (ULTRAM) 50 MG tablet, Take 1-2 tablets (50-100 mg total) by mouth every 4 (four) hours as needed for moderate pain., Disp: 30 tablet, Rfl: 0  Past Medical History: Past Medical History  Diagnosis Date  . Hypercholesteremia     no  medication at this time  . Chronic kidney disease   . Ulcer     on medication  . Headache(784.0)   . Arthritis   . Complication of anesthesia     had difficulty with breathing, had difficulty waking up  . Trigger finger     on both hands  . Chronic back pain   . Alcohol abuse   . Gonorrhea   . Hypertension     Tobacco Use: History  Smoking status  . Former Smoker  . Types: Cigarettes  Smokeless tobacco  . Not on file    Comment: stopped at age 85 years    Labs: Recent Review Flowsheet Data    Labs for ITP Cardiac and Pulmonary Rehab Latest Ref Rng 02/29/2016 02/29/2016 02/29/2016 02/29/2016 03/01/2016   PHART 7.350 - 7.450 7.424 7.312(L) 7.328(L) - -   PCO2ART 35.0 - 45.0 mmHg 34.2(L) 40.7 37.8 - -   HCO3 20.0 - 24.0 mEq/L 22.7 20.8 19.9(L) - -   TCO2 0 - 100 mmol/L '24 22 21 23 23   '$ ACIDBASEDEF 0.0 - 2.0 mmol/L 2.0 5.0(H) 6.0(H) - -   O2SAT - 97.0 98.0 98.0 - -       Exercise Target Goals: Date: 03/31/16  Exercise Program Goal: Individual exercise prescription set with THRR, safety & activity  barriers. Participant demonstrates ability to understand and report RPE using BORG scale, to self-measure pulse accurately, and to acknowledge the importance of the exercise prescription.  Exercise Prescription Goal: Starting with aerobic activity 30 plus minutes a day, 3 days per week for initial exercise prescription. Provide home exercise prescription and guidelines that participant acknowledges understanding prior to discharge.  Activity Barriers & Risk Stratification:     Activity Barriers & Cardiac Risk Stratification - 03/31/16 1659    Activity Barriers & Cardiac Risk Stratification   Activity Barriers Arthritis;Shortness of Breath;Joint Problems;Deconditioning  arthritis /joints- back, elbows,shoulders. SOB"I think is more sinus since my surgery"   Cardiac Risk Stratification High      6 Minute Walk:     6 Minute Walk      03/31/16 1511       6 Minute Walk    Distance 1422 feet     Walk Time 6 minutes     MPH 2.7     METS 4     RPE 11     VO2 Peak 14.04     Symptoms No     Resting HR 78 bpm     Max Ex. HR 112 bpm     Max Ex. BP 132/64 mmHg        Initial Exercise Prescription:     Initial Exercise Prescription - 03/31/16 1500    Date of Initial Exercise RX and Referring Provider   Date 03/31/16   Referring Provider Bartle   Treadmill   MPH 2.5   Grade 2.5   Minutes 15   METs 3.78   Recumbant Bike   Level 3   RPM 60   Watts 40   Minutes 15   METs 3.8   NuStep   Level 3   Watts 60   Minutes 15   METs 3.8   Elliptical   Level 1   Speed 50   Minutes 5   REL-XR   Level 3   Watts 60   Minutes 15   METs 3.8   T5 Nustep   Level 2   Watts 60   Minutes 15   METs 3.8   Prescription Details   Frequency (times per week) 3   Duration Progress to 30 minutes of continuous aerobic without signs/symptoms of physical distress   Intensity   THRR 40-80% of Max Heartrate 111-144   Ratings of Perceived Exertion 11-13   Progression   Progression Continue to progress workloads to maintain intensity without signs/symptoms of physical distress.   Resistance Training   Training Prescription Yes   Weight 3      Perform Capillary Blood Glucose checks as needed.  Exercise Prescription Changes:     Exercise Prescription Changes      03/31/16 1230           Response to Exercise   Blood Pressure (Exercise) 132/64 mmHg       Blood Pressure (Exit) 114/70 mmHg       Heart Rate (Admit) 67 bpm       Heart Rate (Exercise) 112 bpm       Heart Rate (Exit) 73 bpm       Rating of Perceived Exertion (Exercise) 11          Exercise Comments:     Exercise Comments      03/31/16 1522           Exercise Comments Mr. Saleeby has no injuries that affect exercise.  Discharge Exercise Prescription (Final Exercise Prescription Changes):     Exercise Prescription Changes - 03/31/16 1230    Response to Exercise   Blood  Pressure (Exercise) 132/64 mmHg   Blood Pressure (Exit) 114/70 mmHg   Heart Rate (Admit) 67 bpm   Heart Rate (Exercise) 112 bpm   Heart Rate (Exit) 73 bpm   Rating of Perceived Exertion (Exercise) 11      Nutrition:  Target Goals: Understanding of nutrition guidelines, daily intake of sodium '1500mg'$ , cholesterol '200mg'$ , calories 30% from fat and 7% or less from saturated fats, daily to have 5 or more servings of fruits and vegetables.  Biometrics:     Pre Biometrics - 03/31/16 1521    Pre Biometrics   Height 5' 6.5" (1.689 m)   Weight 152 lb (68.947 kg)   Waist Circumference 35.25 inches   Hip Circumference 38.5 inches   Waist to Hip Ratio 0.92 %   BMI (Calculated) 24.2   Single Leg Stand 30 seconds       Nutrition Therapy Plan and Nutrition Goals:     Nutrition Therapy & Goals - 03/31/16 1652    Intervention Plan   Intervention Prescribe, educate and counsel regarding individualized specific dietary modifications aiming towards targeted core components such as weight, hypertension, lipid management, diabetes, heart failure and other comorbidities.   Expected Outcomes Short Term Goal: Understand basic principles of dietary content, such as calories, fat, sodium, cholesterol and nutrients.;Short Term Goal: A plan has been developed with personal nutrition goals set during dietitian appointment.;Long Term Goal: Adherence to prescribed nutrition plan.      Nutrition Discharge: Rate Your Plate Scores:   Nutrition Goals Re-Evaluation:   Psychosocial: Target Goals: Acknowledge presence or absence of depression, maximize coping skills, provide positive support system. Participant is able to verbalize types and ability to use techniques and skills needed for reducing stress and depression.  Initial Review & Psychosocial Screening:     Initial Psych Review & Screening - 03/31/16 1654    Initial Review   Current issues with Current Stress Concerns  Concerned about daughter  with problems that are stressful   Comments Family conerns is causing stress   Family Dynamics   Good Support System? Yes  Wife, family and church   Barriers   Psychosocial barriers to participate in program There are no identifiable barriers or psychosocial needs.;The patient should benefit from training in stress management and relaxation.   Screening Interventions   Interventions Encouraged to exercise      Quality of Life Scores:     Quality of Life - 03/31/16 1434    Quality of Life Scores   Health/Function Pre 22.57 %   Socioeconomic Pre 22 %   Psych/Spiritual Pre 29.64 %   Family Pre 13.2 %   GLOBAL Pre 22.53 %      PHQ-9:     Recent Review Flowsheet Data    Depression screen [facility] 2/9 03/31/2016   Decreased Interest 0   Down, Depressed, Hopeless 0   PHQ - 2 Score 0   Altered sleeping 0   Tired, decreased energy 3   Change in appetite 0   Feeling bad or failure about yourself  0   Trouble concentrating 3   Moving slowly or fidgety/restless 0   Suicidal thoughts 0   PHQ-9 Score 6   Difficult doing work/chores Somewhat difficult      Psychosocial Evaluation and Intervention:   Psychosocial Re-Evaluation:   Vocational Rehabilitation: Provide vocational rehab assistance  to qualifying candidates.   Vocational Rehab Evaluation & Intervention:     Vocational Rehab - 03/31/16 1702    Initial Vocational Rehab Evaluation & Intervention   Assessment shows need for Vocational Rehabilitation No      Education: Education Goals: Education classes will be provided on a weekly basis, covering required topics. Participant will state understanding/return demonstration of topics presented.  Learning Barriers/Preferences:     Learning Barriers/Preferences - 03/31/16 1701    Learning Barriers/Preferences   Learning Barriers Sight;Hearing;Inability to learn new things;Reading  left school at 7 th grade, always trouble learning.  Worked ever since left school.     Learning Preferences Individual Instruction      Education Topics: General Nutrition Guidelines/Fats and Fiber: -Group instruction provided by verbal, written material, models and posters to present the general guidelines for heart healthy nutrition. Gives an explanation and review of dietary fats and fiber.   Controlling Sodium/Reading Food Labels: -Group verbal and written material supporting the discussion of sodium use in heart healthy nutrition. Review and explanation with models, verbal and written materials for utilization of the food label.   Exercise Physiology & Risk Factors: - Group verbal and written instruction with models to review the exercise physiology of the cardiovascular system and associated critical values. Details cardiovascular disease risk factors and the goals associated with each risk factor.   Aerobic Exercise & Resistance Training: - Gives group verbal and written discussion on the health impact of inactivity. On the components of aerobic and resistive training programs and the benefits of this training and how to safely progress through these programs.   Flexibility, Balance, General Exercise Guidelines: - Provides group verbal and written instruction on the benefits of flexibility and balance training programs. Provides general exercise guidelines with specific guidelines to those with heart or lung disease. Demonstration and skill practice provided.   Stress Management: - Provides group verbal and written instruction about the health risks of elevated stress, cause of high stress, and healthy ways to reduce stress.   Depression: - Provides group verbal and written instruction on the correlation between heart/lung disease and depressed mood, treatment options, and the stigmas associated with seeking treatment.   Anatomy & Physiology of the Heart: - Group verbal and written instruction and models provide basic cardiac anatomy and physiology, with the  coronary electrical and arterial systems. Review of: AMI, Angina, Valve disease, Heart Failure, Cardiac Arrhythmia, Pacemakers, and the ICD.   Cardiac Procedures: - Group verbal and written instruction and models to describe the testing methods done to diagnose heart disease. Reviews the outcomes of the test results. Describes the treatment choices: Medical Management, Angioplasty, or Coronary Bypass Surgery.   Cardiac Medications: - Group verbal and written instruction to review commonly prescribed medications for heart disease. Reviews the medication, class of the drug, and side effects. Includes the steps to properly store meds and maintain the prescription regimen.   Go Sex-Intimacy & Heart Disease, Get SMART - Goal Setting: - Group verbal and written instruction through game format to discuss heart disease and the return to sexual intimacy. Provides group verbal and written material to discuss and apply goal setting through the application of the S.M.A.R.T. Method.   Other Matters of the Heart: - Provides group verbal, written materials and models to describe Heart Failure, Angina, Valve Disease, and Diabetes in the realm of heart disease. Includes description of the disease process and treatment options available to the cardiac patient.   Exercise & Equipment Safety: - Individual  verbal instruction and demonstration of equipment use and safety with use of the equipment.          Cardiac Rehab from 03/31/2016 in [facility] Cardiac and Pulmonary Rehab   Date  03/31/16   Educator  sb   Instruction Review Code  2- meets goals/outcomes      Infection Prevention: - Provides verbal and written material to individual with discussion of infection control including proper hand washing and proper equipment cleaning during exercise session.      Cardiac Rehab from 03/31/2016 in [facility] Cardiac and Pulmonary Rehab   Date  03/31/16   Educator  sb   Instruction Review Code  2- meets goals/outcomes       Falls Prevention: - Provides verbal and written material to individual with discussion of falls prevention and safety.   Diabetes: - Individual verbal and written instruction to review signs/symptoms of diabetes, desired ranges of glucose level fasting, after meals and with exercise. Advice that pre and post exercise glucose checks will be done for 3 sessions at entry of program.      Cardiac Rehab from 03/31/2016 in [facility] Cardiac and Pulmonary Rehab   Date  03/31/16   Educator  sb   Instruction Review Code  2- meets goals/outcomes       Knowledge Questionnaire Score:   Core Components/Risk Factors/Patient Goals at Admission:     Personal Goals and Risk Factors at Admission - 03/31/16 1652    Core Components/Risk Factors/Patient Goals on Admission   Increase Strength and Stamina Yes   Intervention Provide advice, education, support and counseling about physical activity/exercise needs.;Develop an individualized exercise prescription for aerobic and resistive training based on initial evaluation findings, risk stratification, comorbidities and participant's personal goals.   Expected Outcomes Achievement of increased cardiorespiratory fitness and enhanced flexibility, muscular endurance and strength shown through measurements of functional capacity and personal statement of participant.   Diabetes Yes   Intervention Provide education about signs/symptoms and action to take for hypo/hyperglycemia.;Provide education about proper nutrition, including hydration, and aerobic/resistive exercise prescription along with prescribed medications to achieve blood glucose in normal ranges: Fasting glucose 65-99 mg/dL   Expected Outcomes Short Term: Participant verbalizes understanding of the signs/symptoms and immediate care of hyper/hypoglycemia, proper foot care and importance of medication, aerobic/resistive exercise and nutrition plan for blood glucose control.;Long Term: Attainment of HbA1C <  7%.   Hypertension Yes   Intervention Provide education on lifestyle modifcations including regular physical activity/exercise, weight management, moderate sodium restriction and increased consumption of fresh fruit, vegetables, and low fat dairy, alcohol moderation, and smoking cessation.;Monitor prescription use compliance.   Expected Outcomes Short Term: Continued assessment and intervention until BP is < 140/45m HG in hypertensive participants. < 130/830mHG in hypertensive participants with diabetes, heart failure or chronic kidney disease.;Long Term: Maintenance of blood pressure at goal levels.   Lipids Yes   Intervention Provide education and support for participant on nutrition & aerobic/resistive exercise along with prescribed medications to achieve LDL '70mg'$ , HDL >'40mg'$ .   Expected Outcomes Short Term: Participant states understanding of desired cholesterol values and is compliant with medications prescribed. Participant is following exercise prescription and nutrition guidelines.;Long Term: Cholesterol controlled with medications as prescribed, with individualized exercise RX and with personalized nutrition plan. Value goals: LDL < '70mg'$ , HDL > 40 mg.   Stress Yes   Intervention Offer individual and/or small group education and counseling on adjustment to heart disease, stress management and health-related lifestyle change. Teach and support self-help strategies.;Refer participants  experiencing significant psychosocial distress to appropriate mental health specialists for further evaluation and treatment. When possible, include family members and significant others in education/counseling sessions.   Expected Outcomes Short Term: Participant demonstrates changes in health-related behavior, relaxation and other stress management skills, ability to obtain effective social support, and compliance with psychotropic medications if prescribed.;Long Term: Emotional wellbeing is indicated by absence of  clinically significant psychosocial distress or social isolation.      Core Components/Risk Factors/Patient Goals Review:    Core Components/Risk Factors/Patient Goals at Discharge (Final Review):    ITP Comments:     ITP Comments      03/31/16 1637 03/31/16 1643 03/31/16 1707       ITP Comments Medical review and Intitial ITP completed. Documentation of diagnosis Documentation of diagnosis in [place]  03/04/2016 Medical review and Initial ITP completed today        Comments:

## 2016-03-31 NOTE — Patient Instructions (Signed)
Patient Instructions  Patient Details  Name: Tyler Russell MRN: [id number] Date of Birth: 22-Apr-1956 Referring Provider:  Alleen Borne, MD  Below are the personal goals you chose as well as exercise and nutrition goals. Our goal is to help you keep on track towards obtaining and maintaining your goals. We will be discussing your progress on these goals with you throughout the program.  Initial Exercise Prescription:     Initial Exercise Prescription - 03/31/16 1500    Date of Initial Exercise RX and Referring Provider   Date 03/31/16   Referring Provider Bartle   Treadmill   MPH 2.5   Grade 2.5   Minutes 15   METs 3.78   Recumbant Bike   Level 3   RPM 60   Watts 40   Minutes 15   METs 3.8   NuStep   Level 3   Watts 60   Minutes 15   METs 3.8   Elliptical   Level 1   Speed 50   Minutes 5   REL-XR   Level 3   Watts 60   Minutes 15   METs 3.8   T5 Nustep   Level 2   Watts 60   Minutes 15   METs 3.8   Prescription Details   Frequency (times per week) 3   Duration Progress to 30 minutes of continuous aerobic without signs/symptoms of physical distress   Intensity   THRR 40-80% of Max Heartrate 111-144   Ratings of Perceived Exertion 11-13   Progression   Progression Continue to progress workloads to maintain intensity without signs/symptoms of physical distress.   Resistance Training   Training Prescription Yes   Weight 3      Exercise Goals: Frequency: Be able to perform aerobic exercise three times per week working toward 3-5 days per week.  Intensity: Work with a perceived exertion of 11 (fairly light) - 15 (hard) as tolerated. Follow your new exercise prescription and watch for changes in prescription as you progress with the program. Changes will be reviewed with you when they are made.  Duration: You should be able to do 30 minutes of continuous aerobic exercise in addition to a 5 minute warm-up and a 5 minute cool-down routine.  Nutrition  Goals: Your personal nutrition goals will be established when you do your nutrition analysis with the dietician.  The following are nutrition guidelines to follow: Cholesterol < /day Sodium < /day Fiber: Men over 60 yrs - 30 grams per day  Personal Goals:     Personal Goals and Risk Factors at Admission - 03/31/16 1652    Core Components/Risk Factors/Patient Goals on Admission   Increase Strength and Stamina Yes   Intervention Provide advice, education, support and counseling about physical activity/exercise needs.;Develop an individualized exercise prescription for aerobic and resistive training based on initial evaluation findings, risk stratification, comorbidities and participant's personal goals.   Expected Outcomes Achievement of increased cardiorespiratory fitness and enhanced flexibility, muscular endurance and strength shown through measurements of functional capacity and personal statement of participant.   Diabetes Yes   Intervention Provide education about signs/symptoms and action to take for hypo/hyperglycemia.;Provide education about proper nutrition, including hydration, and aerobic/resistive exercise prescription along with prescribed medications to achieve blood glucose in normal ranges: Fasting glucose 65-99 mg/dL   Expected Outcomes Short Term: Participant verbalizes understanding of the signs/symptoms and immediate care of hyper/hypoglycemia, proper foot care and importance of medication, aerobic/resistive exercise and nutrition plan for blood glucose control.;Long Term:  Attainment of HbA1C < 7%.   Hypertension Yes   Intervention Provide education on lifestyle modifcations including regular physical activity/exercise, weight management, moderate sodium restriction and increased consumption of fresh fruit, vegetables, and low fat dairy, alcohol moderation, and smoking cessation.;Monitor prescription use compliance.   Expected Outcomes Short Term: Continued  assessment and intervention until BP is < 140/3790mm HG in hypertensive participants. < 130/8180mm HG in hypertensive participants with diabetes, heart failure or chronic kidney disease.;Long Term: Maintenance of blood pressure at goal levels.   Lipids Yes   Intervention Provide education and support for participant on nutrition & aerobic/resistive exercise along with prescribed medications to achieve LDL 70mg , HDL >40mg .   Expected Outcomes Short Term: Participant states understanding of desired cholesterol values and is compliant with medications prescribed. Participant is following exercise prescription and nutrition guidelines.;Long Term: Cholesterol controlled with medications as prescribed, with individualized exercise RX and with personalized nutrition plan. Value goals: LDL < 70mg , HDL > 40 mg.   Stress Yes   Intervention Offer individual and/or small group education and counseling on adjustment to heart disease, stress management and health-related lifestyle change. Teach and support self-help strategies.;Refer participants experiencing significant psychosocial distress to appropriate mental health specialists for further evaluation and treatment. When possible, include family members and significant others in education/counseling sessions.   Expected Outcomes Short Term: Participant demonstrates changes in health-related behavior, relaxation and other stress management skills, ability to obtain effective social support, and compliance with psychotropic medications if prescribed.;Long Term: Emotional wellbeing is indicated by absence of clinically significant psychosocial distress or social isolation.      Tobacco Use Initial Evaluation: History  Smoking status  . Former Smoker  . Types: Cigarettes  Smokeless tobacco  . Not on file    Comment: stopped at age 60 years years    Copy of goals given to participant.

## 2016-04-01 ENCOUNTER — Other Ambulatory Visit: Payer: Self-pay | Admitting: Surgery

## 2016-04-01 DIAGNOSIS — Z951 Presence of aortocoronary bypass graft: Secondary | ICD-10-CM

## 2016-04-02 ENCOUNTER — Encounter: Payer: Self-pay | Admitting: Surgery

## 2016-04-02 ENCOUNTER — Ambulatory Visit (INDEPENDENT_AMBULATORY_CARE_PROVIDER_SITE_OTHER): Payer: Self-pay | Admitting: Surgery

## 2016-04-02 ENCOUNTER — Encounter: Payer: Self-pay | Admitting: *Deleted

## 2016-04-02 ENCOUNTER — Ambulatory Visit
Admission: RE | Admit: 2016-04-02 | Discharge: 2016-04-02 | Disposition: A | Discharge status: Home / Self Care | Payer: Medicare Other | Source: Ambulatory Visit | Attending: Surgery | Admitting: Surgery

## 2016-04-02 VITALS — BP 103/66 | HR 68 | Resp 16 | Ht 65.0 in | Wt 154.8 lb

## 2016-04-02 DIAGNOSIS — I2510 Atherosclerotic heart disease of native coronary artery without angina pectoris: Secondary | ICD-10-CM

## 2016-04-02 DIAGNOSIS — R918 Other nonspecific abnormal finding of lung field: Secondary | ICD-10-CM | POA: Diagnosis not present

## 2016-04-02 DIAGNOSIS — Z951 Presence of aortocoronary bypass graft: Secondary | ICD-10-CM

## 2016-04-02 DIAGNOSIS — I214 Non-ST elevation (NSTEMI) myocardial infarction: Secondary | ICD-10-CM

## 2016-04-02 NOTE — Progress Notes (Signed)
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 The patient's early postoperative recovery while in the hospital was notable for an uncomplicated postop course. His HgbA1c was noted to be 8.7 preop with no documented history of diabetes. He was started on Glucophage postop but developed nausea and diarrhea and it had to be stopped with resolution of his symptoms. His glucose was under reasonable control so we decided not to start another drug until he was seen in follow up. Since hospital discharge the patient reports that he has been feeling well. He is walking a couple miles per day without chest pain or shortness of breath. He has been watching his diet closely and checking his glucose a few times per day. He has his records with him and the glucose has been in the 110-120's consistently. The highest I saw was 160 once. He has noticed brief dizziness when standing up that passes in a minute.   Current Outpatient Prescriptions  Medication Sig Dispense Refill  . aspirin 81 MG tablet Take 81 mg by mouth daily.    . blood glucose meter kit and supplies Dispense based on patient and insurance preference. Use up to four times daily as directed. (FOR ICD-9 250.00, 250.01). 1 each 0  . gemfibrozil (LOPID) 600 MG tablet Take 600 mg by mouth 2 (two) times daily before a meal.    . lisinopril (ZESTRIL) 2.5 MG tablet Take 1 tablet (2.5 mg total) by mouth daily. 30 tablet 3  . metoprolol (LOPRESSOR) 50 MG tablet Take 1 tablet (50 mg total) by mouth 2 (two) times daily. 60 tablet 3  . omeprazole (PRILOSEC) 20 MG capsule Take 20 mg by mouth daily.     Marland Kitchen aspirin EC 325 MG EC tablet Take 1 tablet (325 mg total) by mouth daily. (Patient not taking: Reported on 03/31/2016) 30 tablet 0  . atorvastatin (LIPITOR) 40 MG tablet Take 1 tablet (40 mg total) by mouth daily at 6 PM. (Patient not taking: Reported on 03/31/2016) 30 tablet 3  . metFORMIN (GLUCOPHAGE)  850 MG tablet Take 1 tablet (850 mg total) by mouth 2 (two) times daily with a meal. (Patient not taking: Reported on 04/02/2016) 60 tablet 3  . traMADol (ULTRAM) 50 MG tablet Take 1-2 tablets (50-100 mg total) by mouth every 4 (four) hours as needed for moderate pain. (Patient not taking: Reported on 04/02/2016) 30 tablet 0   No current facility-administered medications for this visit.    Physical Exam: BP 103/66 mmHg  Pulse 68  Resp 16  Ht _0  (1.651 m)  Wt 154 lb 12.8 oz (70.217 kg)  BMI 25.76 kg/m2  SpO2 98% He looks well. Lung exam is clear. Cardiac exam shows a regular rate and rhythm with normal heart sounds. Chest incision is healing well and sternum is stable. The leg incisions are healing well and there is no peripheral edema.  Diagnostic Tests:  CLINICAL DATA: Follow-up from CABG on February 29, 2016; no current complaints  EXAM: CHEST 2 VIEW  COMPARISON: Portable chest x-ray of March 02, 2016  FINDINGS: The lungs are mildly hyperinflated with hemidiaphragm flattening. There is no infiltrate, pleural effusion, or significant scarring. The heart and pulmonary vascularity are normal. The mediastinum is normal in width. The sternal wires are intact. The retrosternal soft tissues are normal.  IMPRESSION: Mild hyperinflation which may reflect chronic bronchitis. Post CABG changes. No pneumonia, CHF, nor other acute cardiopulmonary abnormality.  Electronically Signed  By: David Martinique M.D.  On: 04/02/2016 09:48   Impression:  Overall I think he is doing well. I encouraged him to continue walking. He is planning to participate in cardiac rehab. I told him he could drive his car but should not lift anything heavier than 10 lbs for three months postop. His BP is low normal and with his postural dizziness I asked him to decrease his Lopressor to 25 mg bid. He stopped taking his Lipitor and Lopid due to some nausea and will address that with cardiology at  his next visit.    Plan:  He is going to follow up with Dr. Nehemiah Massed in the next two weeks. He will return to see me if he has any problems with his incisions.   Gaye Pollack, MD [facility] [phone]

## 2016-04-02 NOTE — Progress Notes (Signed)
Cardiac Individual Treatment Plan  Patient Details  Name: Tyler Russell MRN: [id number] Date of Birth: 1955-09-28 Referring Provider:        Cardiac Rehab from 03/31/2016 in [facility] Cardiac and Pulmonary Rehab   Referring Provider  Bartle      Initial Encounter Date:       Cardiac Rehab from 03/31/2016 in [facility] Cardiac and Pulmonary Rehab   Date  03/31/16   Referring Provider  Bartle      Visit Diagnosis: NSTEMI (non-ST elevated myocardial infarction) ([place])  S/P CABG x 3  Patient's Home Medications on Admission:  Current outpatient prescriptions:  .  aspirin 81 MG tablet, Take 81 mg by mouth daily., Disp: , Rfl:  .  aspirin EC 325 MG EC tablet, Take 1 tablet (325 mg total) by mouth daily. (Patient not taking: Reported on 03/31/2016), Disp: 30 tablet, Rfl: 0 .  atorvastatin (LIPITOR) 40 MG tablet, Take 1 tablet (40 mg total) by mouth daily at 6 PM. (Patient not taking: Reported on 03/31/2016), Disp: 30 tablet, Rfl: 3 .  blood glucose meter kit and supplies, Dispense based on patient and insurance preference. Use up to four times daily as directed. (FOR ICD-9 250.00, 250.01)., Disp: 1 each, Rfl: 0 .  gemfibrozil (LOPID) 600 MG tablet, Take 600 mg by mouth 2 (two) times daily before a meal., Disp: , Rfl:  .  lisinopril (ZESTRIL) 2.5 MG tablet, Take 1 tablet (2.5 mg total) by mouth daily., Disp: 30 tablet, Rfl: 3 .  metFORMIN (GLUCOPHAGE) 850 MG tablet, Take 1 tablet (850 mg total) by mouth 2 (two) times daily with a meal., Disp: 60 tablet, Rfl: 3 .  metoprolol (LOPRESSOR) 50 MG tablet, Take 1 tablet (50 mg total) by mouth 2 (two) times daily., Disp: 60 tablet, Rfl: 3 .  omeprazole (PRILOSEC) 20 MG capsule, Take 20 mg by mouth daily. , Disp: , Rfl:  .  traMADol (ULTRAM) 50 MG tablet, Take 1-2 tablets (50-100 mg total) by mouth every 4 (four) hours as needed for moderate pain., Disp: 30 tablet, Rfl: 0  Past Medical History: Past Medical History  Diagnosis Date  . Hypercholesteremia     no  medication at this time  . Chronic kidney disease   . Ulcer     on medication  . Headache(784.0)   . Arthritis   . Complication of anesthesia     had difficulty with breathing, had difficulty waking up  . Trigger finger     on both hands  . Chronic back pain   . Alcohol abuse   . Gonorrhea   . Hypertension     Tobacco Use: History  Smoking status  . Former Smoker  . Types: Cigarettes  Smokeless tobacco  . Not on file    Comment: stopped at age 57 years    Labs: Recent Review Flowsheet Data    Labs for ITP Cardiac and Pulmonary Rehab Latest Ref Rng 02/29/2016 02/29/2016 02/29/2016 02/29/2016 03/01/2016   PHART 7.350 - 7.450 7.424 7.312(L) 7.328(L) - -   PCO2ART 35.0 - 45.0 mmHg 34.2(L) 40.7 37.8 - -   HCO3 20.0 - 24.0 mEq/L 22.7 20.8 19.9(L) - -   TCO2 0 - 100 mmol/L _0 ACIDBASEDEF 0.0 - 2.0 mmol/L 2.0 5.0(H) 6.0(H) - -   O2SAT - 97.0 98.0 98.0 - -       Exercise Target Goals:    Exercise Program Goal: Individual exercise prescription set with THRR, safety & activity  barriers. Participant demonstrates ability to understand and report RPE using BORG scale, to self-measure pulse accurately, and to acknowledge the importance of the exercise prescription.  Exercise Prescription Goal: Starting with aerobic activity 30 plus minutes a day, 3 days per week for initial exercise prescription. Provide home exercise prescription and guidelines that participant acknowledges understanding prior to discharge.  Activity Barriers & Risk Stratification:     Activity Barriers & Cardiac Risk Stratification - 03/31/16 1659    Activity Barriers & Cardiac Risk Stratification   Activity Barriers Arthritis;Shortness of Breath;Joint Problems;Deconditioning  arthritis /joints- back, elbows,shoulders. SOB"I think is more sinus since my surgery"   Cardiac Risk Stratification High      6 Minute Walk:     6 Minute Walk      03/31/16 1511       6 Minute Walk   Distance 1422  feet     Walk Time 6 minutes     MPH 2.7     METS 4     RPE 11     VO2 Peak 14.04     Symptoms No     Resting HR 78 bpm     Max Ex. HR 112 bpm     Max Ex. BP 132/64 mmHg        Initial Exercise Prescription:     Initial Exercise Prescription - 03/31/16 1500    Date of Initial Exercise RX and Referring Provider   Date 03/31/16   Referring Provider Bartle   Treadmill   MPH 2.5   Grade 2.5   Minutes 15   METs 3.78   Recumbant Bike   Level 3   RPM 60   Watts 40   Minutes 15   METs 3.8   NuStep   Level 3   Watts 60   Minutes 15   METs 3.8   Elliptical   Level 1   Speed 50   Minutes 5   REL-XR   Level 3   Watts 60   Minutes 15   METs 3.8   T5 Nustep   Level 2   Watts 60   Minutes 15   METs 3.8   Prescription Details   Frequency (times per week) 3   Duration Progress to 30 minutes of continuous aerobic without signs/symptoms of physical distress   Intensity   THRR 40-80% of Max Heartrate 111-144   Ratings of Perceived Exertion 11-13   Progression   Progression Continue to progress workloads to maintain intensity without signs/symptoms of physical distress.   Resistance Training   Training Prescription Yes   Weight 3      Perform Capillary Blood Glucose checks as needed.  Exercise Prescription Changes:     Exercise Prescription Changes      03/31/16 1230           Response to Exercise   Blood Pressure (Exercise) 132/64 mmHg       Blood Pressure (Exit) 114/70 mmHg       Heart Rate (Admit) 67 bpm       Heart Rate (Exercise) 112 bpm       Heart Rate (Exit) 73 bpm       Rating of Perceived Exertion (Exercise) 11          Exercise Comments:     Exercise Comments      03/31/16 1522           Exercise Comments Tyler Russell has no injuries that affect exercise.  Discharge Exercise Prescription (Final Exercise Prescription Changes):     Exercise Prescription Changes - 03/31/16 1230    Response to Exercise   Blood Pressure  (Exercise) 132/64 mmHg   Blood Pressure (Exit) 114/70 mmHg   Heart Rate (Admit) 67 bpm   Heart Rate (Exercise) 112 bpm   Heart Rate (Exit) 73 bpm   Rating of Perceived Exertion (Exercise) 11      Nutrition:  Target Goals: Understanding of nutrition guidelines, daily intake of sodium <1570m, cholesterol <2018m calories 30% from fat and 7% or less from saturated fats, daily to have 5 or more servings of fruits and vegetables.  Biometrics:     Pre Biometrics - 03/31/16 1521    Pre Biometrics   Height 5' 6.5" (1.689 m)   Weight 152 lb (68.947 kg)   Waist Circumference 35.25 inches   Hip Circumference 38.5 inches   Waist to Hip Ratio 0.92 %   BMI (Calculated) 24.2   Single Leg Stand 30 seconds       Nutrition Therapy Plan and Nutrition Goals:     Nutrition Therapy & Goals - 03/31/16 1652    Intervention Plan   Intervention Prescribe, educate and counsel regarding individualized specific dietary modifications aiming towards targeted core components such as weight, hypertension, lipid management, diabetes, heart failure and other comorbidities.   Expected Outcomes Short Term Goal: Understand basic principles of dietary content, such as calories, fat, sodium, cholesterol and nutrients.;Short Term Goal: A plan has been developed with personal nutrition goals set during dietitian appointment.;Long Term Goal: Adherence to prescribed nutrition plan.      Nutrition Discharge: Rate Your Plate Scores:   Nutrition Goals Re-Evaluation:   Psychosocial: Target Goals: Acknowledge presence or absence of depression, maximize coping skills, provide positive support system. Participant is able to verbalize types and ability to use techniques and skills needed for reducing stress and depression.  Initial Review & Psychosocial Screening:     Initial Psych Review & Screening - 03/31/16 1654    Initial Review   Current issues with Current Stress Concerns  Concerned about daughter with  problems that are stressful   Comments Family conerns is causing stress   Family Dynamics   Good Support System? Yes  Wife, family and church   Barriers   Psychosocial barriers to participate in program There are no identifiable barriers or psychosocial needs.;The patient should benefit from training in stress management and relaxation.   Screening Interventions   Interventions Encouraged to exercise      Quality of Life Scores:     Quality of Life - 03/31/16 1434    Quality of Life Scores   Health/Function Pre 22.57 %   Socioeconomic Pre 22 %   Psych/Spiritual Pre 29.64 %   Family Pre 13.2 %   GLOBAL Pre 22.53 %      PHQ-9:     Recent Review Flowsheet Data    [facility]   Decreased Interest 0   Down, Depressed, Hopeless 0   PHQ - 2 Score 0   Altered sleeping 0   Tired, decreased energy 3   Change in appetite 0   Feeling bad or failure about yourself  0   Trouble concentrating 3   Moving slowly or fidgety/restless 0   Suicidal thoughts 0   PHQ-9 Score 6   Difficult doing work/chores Somewhat difficult      Psychosocial Evaluation and Intervention:   Psychosocial Re-Evaluation:   Vocational Rehabilitation: Provide vocational rehab assistance  to qualifying candidates.   Vocational Rehab Evaluation & Intervention:     Vocational Rehab - 03/31/16 1702    Initial Vocational Rehab Evaluation & Intervention   Assessment shows need for Vocational Rehabilitation No      Education: Education Goals: Education classes will be provided on a weekly basis, covering required topics. Participant will state understanding/return demonstration of topics presented.  Learning Barriers/Preferences:     Learning Barriers/Preferences - 03/31/16 1701    Learning Barriers/Preferences   Learning Barriers Sight;Hearing;Inability to learn new things;Reading  left school at 7 th grade, always trouble learning.  Worked ever since left school.     Learning Preferences Individual Instruction      Education Topics: General Nutrition Guidelines/Fats and Fiber: -Group instruction provided by verbal, written material, models and posters to present the general guidelines for heart healthy nutrition. Gives an explanation and review of dietary fats and fiber.   Controlling Sodium/Reading Food Labels: -Group verbal and written material supporting the discussion of sodium use in heart healthy nutrition. Review and explanation with models, verbal and written materials for utilization of the food label.   Exercise Physiology & Risk Factors: - Group verbal and written instruction with models to review the exercise physiology of the cardiovascular system and associated critical values. Details cardiovascular disease risk factors and the goals associated with each risk factor.   Aerobic Exercise & Resistance Training: - Gives group verbal and written discussion on the health impact of inactivity. On the components of aerobic and resistive training programs and the benefits of this training and how to safely progress through these programs.   Flexibility, Balance, General Exercise Guidelines: - Provides group verbal and written instruction on the benefits of flexibility and balance training programs. Provides general exercise guidelines with specific guidelines to those with heart or lung disease. Demonstration and skill practice provided.   Stress Management: - Provides group verbal and written instruction about the health risks of elevated stress, cause of high stress, and healthy ways to reduce stress.   Depression: - Provides group verbal and written instruction on the correlation between heart/lung disease and depressed mood, treatment options, and the stigmas associated with seeking treatment.   Anatomy & Physiology of the Heart: - Group verbal and written instruction and models provide basic cardiac anatomy and physiology, with the  coronary electrical and arterial systems. Review of: AMI, Angina, Valve disease, Heart Failure, Cardiac Arrhythmia, Pacemakers, and the ICD.   Cardiac Procedures: - Group verbal and written instruction and models to describe the testing methods done to diagnose heart disease. Reviews the outcomes of the test results. Describes the treatment choices: Medical Management, Angioplasty, or Coronary Bypass Surgery.   Cardiac Medications: - Group verbal and written instruction to review commonly prescribed medications for heart disease. Reviews the medication, class of the drug, and side effects. Includes the steps to properly store meds and maintain the prescription regimen.   Go Sex-Intimacy & Heart Disease, Get SMART - Goal Setting: - Group verbal and written instruction through game format to discuss heart disease and the return to sexual intimacy. Provides group verbal and written material to discuss and apply goal setting through the application of the S.M.A.R.T. Method.   Other Matters of the Heart: - Provides group verbal, written materials and models to describe Heart Failure, Angina, Valve Disease, and Diabetes in the realm of heart disease. Includes description of the disease process and treatment options available to the cardiac patient.   Exercise & Equipment Safety: - Individual  verbal instruction and demonstration of equipment use and safety with use of the equipment.          Cardiac Rehab from 03/31/2016 in [facility] Cardiac and Pulmonary Rehab   Date  03/31/16   Educator  sb   Instruction Review Code  2- meets goals/outcomes      Infection Prevention: - Provides verbal and written material to individual with discussion of infection control including proper hand washing and proper equipment cleaning during exercise session.      Cardiac Rehab from 03/31/2016 in [facility] Cardiac and Pulmonary Rehab   Date  03/31/16   Educator  sb   Instruction Review Code  2- meets goals/outcomes       Falls Prevention: - Provides verbal and written material to individual with discussion of falls prevention and safety.   Diabetes: - Individual verbal and written instruction to review signs/symptoms of diabetes, desired ranges of glucose level fasting, after meals and with exercise. Advice that pre and post exercise glucose checks will be done for 3 sessions at entry of program.      Cardiac Rehab from 03/31/2016 in [facility] Cardiac and Pulmonary Rehab   Date  03/31/16   Educator  sb   Instruction Review Code  2- meets goals/outcomes       Knowledge Questionnaire Score:   Core Components/Risk Factors/Patient Goals at Admission:     Personal Goals and Risk Factors at Admission - 03/31/16 1652    Core Components/Risk Factors/Patient Goals on Admission   Increase Strength and Stamina Yes   Intervention Provide advice, education, support and counseling about physical activity/exercise needs.;Develop an individualized exercise prescription for aerobic and resistive training based on initial evaluation findings, risk stratification, comorbidities and participant's personal goals.   Expected Outcomes Achievement of increased cardiorespiratory fitness and enhanced flexibility, muscular endurance and strength shown through measurements of functional capacity and personal statement of participant.   Diabetes Yes   Intervention Provide education about signs/symptoms and action to take for hypo/hyperglycemia.;Provide education about proper nutrition, including hydration, and aerobic/resistive exercise prescription along with prescribed medications to achieve blood glucose in normal ranges: Fasting glucose 65-99 mg/dL   Expected Outcomes Short Term: Participant verbalizes understanding of the signs/symptoms and immediate care of hyper/hypoglycemia, proper foot care and importance of medication, aerobic/resistive exercise and nutrition plan for blood glucose control.;Long Term: Attainment of HbA1C <  7%.   Hypertension Yes   Intervention Provide education on lifestyle modifcations including regular physical activity/exercise, weight management, moderate sodium restriction and increased consumption of fresh fruit, vegetables, and low fat dairy, alcohol moderation, and smoking cessation.;Monitor prescription use compliance.   Expected Outcomes Short Term: Continued assessment and intervention until BP is < 140/16m HG in hypertensive participants. < 130/859mHG in hypertensive participants with diabetes, heart failure or chronic kidney disease.;Long Term: Maintenance of blood pressure at goal levels.   Lipids Yes   Intervention Provide education and support for participant on nutrition & aerobic/resistive exercise along with prescribed medications to achieve LDL '70mg'$ , HDL >'40mg'$ .   Expected Outcomes Short Term: Participant states understanding of desired cholesterol values and is compliant with medications prescribed. Participant is following exercise prescription and nutrition guidelines.;Long Term: Cholesterol controlled with medications as prescribed, with individualized exercise RX and with personalized nutrition plan. Value goals: LDL < '70mg'$ , HDL > 40 mg.   Stress Yes   Intervention Offer individual and/or small group education and counseling on adjustment to heart disease, stress management and health-related lifestyle change. Teach and support self-help strategies.;Refer participants  experiencing significant psychosocial distress to appropriate mental health specialists for further evaluation and treatment. When possible, include family members and significant others in education/counseling sessions.   Expected Outcomes Short Term: Participant demonstrates changes in health-related behavior, relaxation and other stress management skills, ability to obtain effective social support, and compliance with psychotropic medications if prescribed.;Long Term: Emotional wellbeing is indicated by absence of  clinically significant psychosocial distress or social isolation.      Core Components/Risk Factors/Patient Goals Review:    Core Components/Risk Factors/Patient Goals at Discharge (Final Review):    ITP Comments:     ITP Comments      03/31/16 1637 03/31/16 1643 03/31/16 1707 04/02/16 0837     ITP Comments Medical review and Intitial ITP completed. Documentation of diagnosis Documentation of diagnosis in [place]  03/04/2016 Medical review and Initial ITP completed today 30 day review. Continue with ITP. HAs completed med review and will start sessions next week       Comments:

## 2016-04-07 ENCOUNTER — Encounter: Payer: Medicare Other | Admitting: *Deleted

## 2016-04-07 DIAGNOSIS — Z951 Presence of aortocoronary bypass graft: Secondary | ICD-10-CM

## 2016-04-07 DIAGNOSIS — I214 Non-ST elevation (NSTEMI) myocardial infarction: Secondary | ICD-10-CM | POA: Diagnosis not present

## 2016-04-07 DIAGNOSIS — I25810 Atherosclerosis of coronary artery bypass graft(s) without angina pectoris: Secondary | ICD-10-CM | POA: Diagnosis not present

## 2016-04-07 DIAGNOSIS — E782 Mixed hyperlipidemia: Secondary | ICD-10-CM | POA: Diagnosis not present

## 2016-04-07 NOTE — Progress Notes (Signed)
Daily Session Note  Patient Details  Name: Tyler Russell MRN: [id number] Date of Birth: 1955/11/07 Referring Provider:   Flowsheet Row Cardiac Rehab from 03/31/2016 in [facility] Cardiac and Pulmonary Rehab  Referring Provider  Bartle      Encounter Date: 04/07/2016  Check In:     Session Check In - 04/07/16 0910      Check-In   Location [facility]-Cardiac & Pulmonary Rehab   Staff Present Alberteen Sam, MA, ACSM RCEP, Exercise Physiologist;Susanne Bice, RN, BSN, Laveda Norman, BS, ACSM CEP, Exercise Physiologist   Supervising physician immediately available to respond to emergencies See telemetry face sheet for immediately available ER MD   Medication changes reported     Yes   Comments decreased lisinopril to 2.5 mg and added ASA daily   Fall or balance concerns reported    No   Warm-up and Cool-down Performed on first and last piece of equipment   VAD Patient? No     Pain Assessment   Currently in Pain? No/denies   Multiple Pain Sites No         Goals Met:  Exercise tolerated well Personal goals reviewed Strength training completed today  Goals Unmet:  Not Applicable  Comments: First full day of exercise!  Patient was oriented to gym and equipment including functions, settings, policies, and procedures.  Patient's individual exercise prescription and treatment plan were reviewed.  All starting workloads were established based on the results of the 6 minute walk test done at initial orientation visit.  The plan for exercise progression was also introduced and progression will be customized based on patient's performance and goals.  After exercise was completed, Tyler Russell complained of feeling dizzy.  He was seated and checked.  Blood pressure was 96/54.  He was given 2 cups of water to increase blood pressure to 104/60.  He was able to do weights and was dizzy again after cool down.  Blood pressure was 92/52.  Then after water 96/60, CBG 96. Pt felt better and was able to sit  through education.   Dr. Emily Filbert is Medical Director for [place] and [facility].

## 2016-04-08 ENCOUNTER — Encounter (HOSPITAL_COMMUNITY): Payer: Self-pay | Admitting: *Deleted

## 2016-04-08 DIAGNOSIS — I2510 Atherosclerotic heart disease of native coronary artery without angina pectoris: Secondary | ICD-10-CM | POA: Insufficient documentation

## 2016-04-08 DIAGNOSIS — E782 Mixed hyperlipidemia: Secondary | ICD-10-CM | POA: Insufficient documentation

## 2016-04-11 ENCOUNTER — Encounter: Payer: Medicare Other | Admitting: *Deleted

## 2016-04-11 DIAGNOSIS — Z951 Presence of aortocoronary bypass graft: Secondary | ICD-10-CM | POA: Diagnosis not present

## 2016-04-11 DIAGNOSIS — I214 Non-ST elevation (NSTEMI) myocardial infarction: Secondary | ICD-10-CM

## 2016-04-11 LAB — GLUCOSE, CAPILLARY: GLUCOSE-CAPILLARY: 106 mg/dL — AB (ref 65–99)

## 2016-04-11 NOTE — Progress Notes (Signed)
Daily Session Note  Patient Details  Name: Tyler Russell MRN: [id number] Date of Birth: 02-25-1956 Referring Provider:   Flowsheet Row Cardiac Rehab from 03/31/2016 in [facility] Card[facility] Rehab  Referring Provider  Bartle      Encounter Date: 04/11/2016  Check In:     Session Check In - 04/11/16 1015      Check-In   Location [facility]-Cardiac & Pulmonary Rehab   Staff Present Nyoka Cowden, RN, BSN, MA;Carroll Enterkin, RN, Levie Heritage, MA, ACSM RCEP, Exercise Physiologist   Supervising physician immediately available to respond to emergencies See telemetry face sheet for immediately available ER MD   Medication changes reported     No   Fall or balance concerns reported    No   Warm-up and Cool-down Performed on first and last piece of equipment   Resistance Training Performed Yes   VAD Patient? No     Pain Assessment   Currently in Pain? No/denies   Multiple Pain Sites No         Goals Met:  Independence with exercise equipment Exercise tolerated well Personal goals reviewed No report of cardiac concerns or symptoms Strength training completed today  Goals Unmet:  Not Applicable  Comments: Pt able to follow exercise prescription today without complaint.  Will continue to monitor for progression. Reviewed METs average and discussed progression with pt today.    Dr. Emily Filbert is Medical Director for [place] and [facility].

## 2016-04-14 ENCOUNTER — Encounter: Payer: Medicare Other | Admitting: *Deleted

## 2016-04-14 DIAGNOSIS — Z951 Presence of aortocoronary bypass graft: Secondary | ICD-10-CM | POA: Diagnosis not present

## 2016-04-14 DIAGNOSIS — I214 Non-ST elevation (NSTEMI) myocardial infarction: Secondary | ICD-10-CM

## 2016-04-14 LAB — GLUCOSE, CAPILLARY
GLUCOSE-CAPILLARY: 119 mg/dL — AB (ref 65–99)
Glucose-Capillary: 116 mg/dL — ABNORMAL HIGH (ref 65–99)

## 2016-04-14 NOTE — Progress Notes (Signed)
Daily Session Note  Patient Details  Name: GEROD CALIGIURI MRN: [id number] Date of Birth: Sep 21, 1955 Referring Provider:   Flowsheet Row Cardiac Rehab from 03/31/2016 in [facility] Cardiac and Pulmonary Rehab  Referring Provider  Bartle      Encounter Date: 04/14/2016  Check In:     Session Check In - 04/14/16 0917      Check-In   Location [facility]-Cardiac & Pulmonary Rehab   Staff Present Heath Lark, RN, BSN, CCRP;Bo Teicher Luan Pulling, MA, ACSM RCEP, Exercise Physiologist;Kelly Amedeo Plenty, BS, ACSM CEP, Exercise Physiologist   Supervising physician immediately available to respond to emergencies See telemetry face sheet for immediately available ER MD   Medication changes reported     No   Fall or balance concerns reported    No   Warm-up and Cool-down Performed on first and last piece of equipment   Resistance Training Performed Yes   VAD Patient? No     Pain Assessment   Currently in Pain? No/denies   Multiple Pain Sites No         Goals Met:  Independence with exercise equipment Exercise tolerated well Personal goals reviewed No report of cardiac concerns or symptoms Strength training completed today  Goals Unmet:  Not Applicable  Comments: Pt able to follow exercise prescription today without complaint.  Will continue to monitor for progression. See ITP for goal review.   Dr. Emily Filbert is Medical Director for [place] and [facility].

## 2016-04-16 ENCOUNTER — Encounter: Payer: Medicare Other | Attending: Surgery

## 2016-04-16 DIAGNOSIS — Z951 Presence of aortocoronary bypass graft: Secondary | ICD-10-CM | POA: Diagnosis not present

## 2016-04-16 DIAGNOSIS — I214 Non-ST elevation (NSTEMI) myocardial infarction: Secondary | ICD-10-CM

## 2016-04-16 NOTE — Progress Notes (Signed)
Daily Session Note  Patient Details  Name: Tyler Russell MRN: [id number] Date of Birth: 05/08/1956 Referring Provider:   Flowsheet Row Cardiac Rehab from 03/31/2016 in [facility] Cardiac and Pulmonary Rehab  Referring Provider  Bartle      Encounter Date: 04/16/2016  Check In:     Session Check In - 04/16/16 0843      Check-In   Location [facility]-Cardiac & Pulmonary Rehab   Staff Present Alberteen Sam, MA, ACSM RCEP, Exercise Physiologist;Damyan Corne Oletta Darter, BA, ACSM CEP, Exercise Physiologist;Carroll Enterkin, RN, BSN   Supervising physician immediately available to respond to emergencies See telemetry face sheet for immediately available ER MD   Medication changes reported     No   Fall or balance concerns reported    No   Warm-up and Cool-down Performed on first and last piece of equipment   Resistance Training Performed Yes   VAD Patient? No     Pain Assessment   Currently in Pain? No/denies   Multiple Pain Sites No         Goals Met:  Proper associated with RPD/PD & O2 Sat Exercise tolerated well Personal goals reviewed Strength training completed today  Goals Unmet:  Not Applicable  Comments: Pt able to follow exercise prescription today without complaint.  Will continue to monitor for progression.    Dr. Emily Filbert is Medical Director for [place] and [facility].

## 2016-04-21 ENCOUNTER — Encounter: Payer: Medicare Other | Admitting: *Deleted

## 2016-04-21 DIAGNOSIS — Z951 Presence of aortocoronary bypass graft: Secondary | ICD-10-CM

## 2016-04-21 DIAGNOSIS — I214 Non-ST elevation (NSTEMI) myocardial infarction: Secondary | ICD-10-CM

## 2016-04-21 NOTE — Progress Notes (Signed)
Daily Session Note  Patient Details  Name: Tyler Russell MRN: [id number] Date of Birth: [birth year] Referring Provider:   Flowsheet Row Cardiac Rehab from 03/31/2016 in [facility] Cardiac and Pulmonary Rehab  Referring Provider  Bartle      Encounter Date: 04/21/2016  Check In:     Session Check In - 04/21/16 0842      Check-In   Location [facility]-Cardiac & Pulmonary Rehab   Staff Present Alberteen Sam, MA, ACSM RCEP, Exercise Physiologist;Kelly Amedeo Plenty, BS, ACSM CEP, Exercise Physiologist;Susanne Bice, RN, BSN, CCRP   Supervising physician immediately available to respond to emergencies See telemetry face sheet for immediately available ER MD   Medication changes reported     No   Fall or balance concerns reported    No   Warm-up and Cool-down Performed on first and last piece of equipment   Resistance Training Performed Yes   VAD Patient? No     Pain Assessment   Currently in Pain? No/denies   Multiple Pain Sites No         Goals Met:  Independence with exercise equipment Exercise tolerated well No report of cardiac concerns or symptoms Strength training completed today  Goals Unmet:  Not Applicable  Comments: Pt able to follow exercise prescription today without complaint.  Will continue to monitor for progression.    Dr. Emily Filbert is Medical Director for [place] and [facility].

## 2016-04-23 DIAGNOSIS — Z951 Presence of aortocoronary bypass graft: Secondary | ICD-10-CM

## 2016-04-23 DIAGNOSIS — I214 Non-ST elevation (NSTEMI) myocardial infarction: Secondary | ICD-10-CM

## 2016-04-23 NOTE — Progress Notes (Signed)
Daily Session Note  Patient Details  Name: Tyler Russell MRN: [id number] Date of Birth: 05/05/1956 Referring Provider:   Flowsheet Row Cardiac Rehab from 03/31/2016 in [facility] Cardiac and Pulmonary Rehab  Referring Provider  Bartle      Encounter Date: 04/23/2016  Check In:     Session Check In - 04/23/16 0802      Check-In   Location [facility]-Cardiac & Pulmonary Rehab   Staff Present Gerlene Burdock, RN, BSN;Jessica Luan Pulling, MA, ACSM RCEP, Exercise Physiologist;Raedyn Wenke Oletta Darter, [place], ACSM CEP, Exercise Physiologist   Supervising physician immediately available to respond to emergencies See telemetry face sheet for immediately available ER MD   Medication changes reported     No   Fall or balance concerns reported    No   Warm-up and Cool-down Performed on first and last piece of equipment   Resistance Training Performed Yes   VAD Patient? No     Pain Assessment   Currently in Pain? No/denies   Multiple Pain Sites No           Exercise Prescription Changes - 04/22/16 1500      Exercise Review   Progression Yes     Response to Exercise   Blood Pressure (Admit) 112/64   Blood Pressure (Exercise) 136/70   Blood Pressure (Exit) 94/50   Heart Rate (Admit) 78 bpm   Heart Rate (Exercise) 123 bpm   Heart Rate (Exit) 77 bpm   Rating of Perceived Exertion (Exercise) 13   Duration Progress to 45 minutes of aerobic exercise without signs/symptoms of physical distress   Intensity THRR unchanged     Progression   Progression Continue to progress workloads to maintain intensity without signs/symptoms of physical distress.   Average METs 3.96     Resistance Training   Training Prescription Yes   Weight 3 lbs   Reps 10-15     Interval Training   Interval Training No     Treadmill   MPH 2.5   Grade 2.5   Minutes 15   METs 3.78     Elliptical   Level 1   Speed 4.3   Minutes 15     T5 Nustep   Level 2   Minutes 15   METs 2.1      Goals Met:  Independence with  exercise equipment Exercise tolerated well Strength training completed today  Goals Unmet:  Not Applicable  Comments: Pt able to follow exercise prescription today without complaint.  Will continue to monitor for progression.    Dr. Emily Filbert is Medical Director for [place] and [facility].

## 2016-04-28 ENCOUNTER — Encounter: Payer: Medicare Other | Admitting: *Deleted

## 2016-04-28 DIAGNOSIS — Z951 Presence of aortocoronary bypass graft: Secondary | ICD-10-CM | POA: Diagnosis not present

## 2016-04-28 DIAGNOSIS — I214 Non-ST elevation (NSTEMI) myocardial infarction: Secondary | ICD-10-CM

## 2016-04-28 NOTE — Progress Notes (Signed)
Daily Session Note  Patient Details  Name: Tyler Russell MRN: [id number] Date of Birth: 1956-03-06 Referring Provider:   Flowsheet Row Cardiac Rehab from 03/31/2016 in [facility] Cardiac and Pulmonary Rehab  Referring Provider  Bartle      Encounter Date: 04/28/2016  Check In:     Session Check In - 04/28/16 0803      Check-In   Location [facility]-Cardiac & Pulmonary Rehab   Staff Present Alberteen Sam, MA, ACSM RCEP, Exercise Physiologist;Kelly Amedeo Plenty, BS, ACSM CEP, Exercise Physiologist;Carroll Enterkin, RN, BSN   Supervising physician immediately available to respond to emergencies See telemetry face sheet for immediately available ER MD   Medication changes reported     No   Fall or balance concerns reported    No   Warm-up and Cool-down Performed on first and last piece of equipment   Resistance Training Performed Yes     Pain Assessment   Currently in Pain? No/denies   Multiple Pain Sites No         Goals Met:  Independence with exercise equipment Exercise tolerated well No report of cardiac concerns or symptoms  Goals Unmet:  Not Applicable  Comments: Pt able to follow exercise prescription today without complaint.  Will continue to monitor for progression.    Dr. Emily Filbert is Medical Director for [place] and [facility].

## 2016-04-29 DIAGNOSIS — R0602 Shortness of breath: Secondary | ICD-10-CM | POA: Diagnosis not present

## 2016-04-29 DIAGNOSIS — I2510 Atherosclerotic heart disease of native coronary artery without angina pectoris: Secondary | ICD-10-CM | POA: Diagnosis not present

## 2016-04-29 DIAGNOSIS — E1165 Type 2 diabetes mellitus with hyperglycemia: Secondary | ICD-10-CM | POA: Diagnosis not present

## 2016-04-29 DIAGNOSIS — M5010 Cervical disc disorder with radiculopathy, unspecified cervical region: Secondary | ICD-10-CM | POA: Diagnosis not present

## 2016-04-30 ENCOUNTER — Encounter: Payer: Self-pay | Admitting: *Deleted

## 2016-04-30 DIAGNOSIS — I214 Non-ST elevation (NSTEMI) myocardial infarction: Secondary | ICD-10-CM

## 2016-04-30 DIAGNOSIS — Z951 Presence of aortocoronary bypass graft: Secondary | ICD-10-CM

## 2016-04-30 NOTE — Progress Notes (Signed)
Cardiac Individual Treatment Plan  Patient Details  Name: Tyler Russell MRN: [id number] Date of Birth: 10/25/1955 Referring Provider:   Flowsheet Row Cardiac Rehab from 03/31/2016 in [facility] Cardiac and Pulmonary Rehab  Referring Provider  Bartle      Initial Encounter Date:  Flowsheet Row Cardiac Rehab from 03/31/2016 in [facility] Cardiac and Pulmonary Rehab  Date  03/31/16  Referring Provider  Bartle      Visit Diagnosis: NSTEMI (non-ST elevated myocardial infarction) ([place])  S/P CABG x 3  Patient's Home Medications on Admission:  Current Outpatient Prescriptions:  .  aspirin 81 MG tablet, Take 81 mg by mouth daily., Disp: , Rfl:  .  aspirin EC 325 MG EC tablet, Take 1 tablet (325 mg total) by mouth daily. (Patient not taking: Reported on 03/31/2016), Disp: 30 tablet, Rfl: 0 .  atorvastatin (LIPITOR) 40 MG tablet, Take 1 tablet (40 mg total) by mouth daily at 6 PM. (Patient not taking: Reported on 03/31/2016), Disp: 30 tablet, Rfl: 3 .  blood glucose meter kit and supplies, Dispense based on patient and insurance preference. Use up to four times daily as directed. (FOR ICD-9 250.00, 250.01)., Disp: 1 each, Rfl: 0 .  gemfibrozil (LOPID) 600 MG tablet, Take 600 mg by mouth 2 (two) times daily before a meal., Disp: , Rfl:  .  lisinopril (ZESTRIL) 2.5 MG tablet, Take 1 tablet (2.5 mg total) by mouth daily., Disp: 30 tablet, Rfl: 3 .  metFORMIN (GLUCOPHAGE) 850 MG tablet, Take 1 tablet (850 mg total) by mouth 2 (two) times daily with a meal. (Patient not taking: Reported on 04/02/2016), Disp: 60 tablet, Rfl: 3 .  metoprolol (LOPRESSOR) 50 MG tablet, Take 1 tablet (50 mg total) by mouth 2 (two) times daily., Disp: 60 tablet, Rfl: 3 .  omeprazole (PRILOSEC) 20 MG capsule, Take 20 mg by mouth daily. , Disp: , Rfl:  .  traMADol (ULTRAM) 50 MG tablet, Take 1-2 tablets (50-100 mg total) by mouth every 4 (four) hours as needed for moderate pain. (Patient not taking: Reported on 04/02/2016), Disp: 30  tablet, Rfl: 0  Past Medical History: Past Medical History:  Diagnosis Date  . Alcohol abuse   . Arthritis   . Chronic back pain   . Chronic kidney disease   . Complication of anesthesia    had difficulty with breathing, had difficulty waking up  . Gonorrhea   . Headache(784.0)   . Hypercholesteremia    no medication at this time  . Hypertension   . Trigger finger    on both hands  . Ulcer    on medication    Tobacco Use: History  Smoking Status  . Former Smoker  . Types: Cigarettes  Smokeless Tobacco  . Not on file    Comment: stopped at age 43 years    Labs: Recent Review Flowsheet Data    Labs for ITP Cardiac and Pulmonary Rehab Latest Ref Rng & Units 02/29/2016 02/29/2016 02/29/2016 02/29/2016 03/01/2016   Cholestrol 0 - 200 mg/dL - - - - -   LDLCALC 0 - 99 mg/dL - - - - -   HDL >40 mg/dL - - - - -   Trlycerides <150 mg/dL - - - - -   Hemoglobin A1c 4.8 - 5.6 % - - - - -   PHART 7.350 - 7.450 7.424 7.312(L) 7.328(L) - -   PCO2ART 35.0 - 45.0 mmHg 34.2(L) 40.7 37.8 - -   HCO3 20.0 - 24.0 mEq/L 22.7 20.8 19.9(L) - -  TCO2 0 - 100 mmol/L _0 ACIDBASEDEF 0.0 - 2.0 mmol/L 2.0 5.0(H) 6.0(H) - -   O2SAT % 97.0 98.0 98.0 - -       Exercise Target Goals:    Exercise Program Goal: Individual exercise prescription set with THRR, safety & activity barriers. Participant demonstrates ability to understand and report RPE using BORG scale, to self-measure pulse accurately, and to acknowledge the importance of the exercise prescription.  Exercise Prescription Goal: Starting with aerobic activity 30 plus minutes a day, 3 days per week for initial exercise prescription. Provide home exercise prescription and guidelines that participant acknowledges understanding prior to discharge.  Activity Barriers & Risk Stratification:     Activity Barriers & Cardiac Risk Stratification - 03/31/16 1659      Activity Barriers & Cardiac Risk Stratification   Activity  Barriers Arthritis;Shortness of Breath;Joint Problems;Deconditioning  arthritis /joints- back, elbows,shoulders. SOB"I think is more sinus since my surgery"   Cardiac Risk Stratification High      6 Minute Walk:     6 Minute Walk    Row Name 03/31/16 1511         6 Minute Walk   Distance 1422 feet     Walk Time 6 minutes     MPH 2.7     METS 4     RPE 11     VO2 Peak 14.04     Symptoms No     Resting HR 78 bpm     Max Ex. HR 112 bpm     Max Ex. BP 132/64        Initial Exercise Prescription:     Initial Exercise Prescription - 03/31/16 1500      Date of Initial Exercise RX and Referring Provider   Date 03/31/16   Referring Provider Bartle     Treadmill   MPH 2.5   Grade 2.5   Minutes 15   METs 3.78     Recumbant Bike   Level 3   RPM 60   Watts 40   Minutes 15   METs 3.8     NuStep   Level 3   Watts 60   Minutes 15   METs 3.8     Elliptical   Level 1   Speed 50   Minutes 5     REL-XR   Level 3   Watts 60   Minutes 15   METs 3.8     T5 Nustep   Level 2   Watts 60   Minutes 15   METs 3.8     Prescription Details   Frequency (times per week) 3   Duration Progress to 30 minutes of continuous aerobic without signs/symptoms of physical distress     Intensity   THRR 40-80% of Max Heartrate 111-144   Ratings of Perceived Exertion 11-13     Progression   Progression Continue to progress workloads to maintain intensity without signs/symptoms of physical distress.     Resistance Training   Training Prescription Yes   Weight 3      Perform Capillary Blood Glucose checks as needed.  Exercise Prescription Changes:     Exercise Prescription Changes    Row Name 03/31/16 1230 04/08/16 1300 04/22/16 1500         Exercise Review   Progression  - No  First full day of exercise Yes       Response to Exercise   Blood Pressure (Admit)  -  122/66 112/64     Blood Pressure (Exercise) 132/64 136/64 136/70     Blood Pressure (Exit) 114/70  92/52 94/50     Heart Rate (Admit) 67 bpm 71 bpm 78 bpm     Heart Rate (Exercise) 112 bpm 124 bpm 123 bpm     Heart Rate (Exit) 73 bpm 70 bpm 77 bpm     Rating of Perceived Exertion (Exercise) _0 Symptoms  - Dizziness after elliptical and after weights  -     Duration  - Progress to 45 minutes of aerobic exercise without signs/symptoms of physical distress Progress to 45 minutes of aerobic exercise without signs/symptoms of physical distress     Intensity  - THRR unchanged THRR unchanged       Progression   Progression  - Continue to progress workloads to maintain intensity without signs/symptoms of physical distress. Continue to progress workloads to maintain intensity without signs/symptoms of physical distress.     Average METs  - 4.35 3.96       Resistance Training   Training Prescription  - Yes Yes     Weight  - 3 3 lbs     Reps  - 10-12 10-15       Interval Training   Interval Training  - No No       Treadmill   MPH  -  - 2.5     Grade  -  - 2.5     Minutes  -  - 15     METs  -  - 3.78       Elliptical   Level  - 1 1     Speed  - 3 4.3     Minutes  - 15 15       T5 Nustep   Level  - 2 2     Minutes  - 15 15     METs  - 2.2 2.1        Exercise Comments:     Exercise Comments    Row Name 03/31/16 1522 04/07/16 0916 04/11/16 1016 04/22/16 1525     Exercise Comments Tyler Russell has no injuries that affect exercise. First full day of exercise!  Patient was oriented to gym and equipment including functions, settings, policies, and procedures.  Patient's individual exercise prescription and treatment plan were reviewed.  All starting workloads were established based on the results of the 6 minute walk test done at initial orientation visit.  The plan for exercise progression was also introduced and progression will be customized based on patient's performance and goals. Reviewed METs average and discussed progression with pt today. Tyler Russell is off to a good start  with exercise.  Tyler Russell is already doing 4.3 mph on the elliptical.  We will continue to monitor for progression.       Discharge Exercise Prescription (Final Exercise Prescription Changes):     Exercise Prescription Changes - 04/22/16 1500      Exercise Review   Progression Yes     Response to Exercise   Blood Pressure (Admit) 112/64   Blood Pressure (Exercise) 136/70   Blood Pressure (Exit) 94/50   Heart Rate (Admit) 78 bpm   Heart Rate (Exercise) 123 bpm   Heart Rate (Exit) 77 bpm   Rating of Perceived Exertion (Exercise) 13   Duration Progress to 45 minutes of aerobic exercise without signs/symptoms of physical distress   Intensity THRR unchanged     Progression  Progression Continue to progress workloads to maintain intensity without signs/symptoms of physical distress.   Average METs 3.96     Resistance Training   Training Prescription Yes   Weight 3 lbs   Reps 10-15     Interval Training   Interval Training No     Treadmill   MPH 2.5   Grade 2.5   Minutes 15   METs 3.78     Elliptical   Level 1   Speed 4.3   Minutes 15     T5 Nustep   Level 2   Minutes 15   METs 2.1      Nutrition:  Target Goals: Understanding of nutrition guidelines, daily intake of sodium <1576m, cholesterol <209m calories 30% from fat and 7% or less from saturated fats, daily to have 5 or more servings of fruits and vegetables.  Biometrics:     Pre Biometrics - 03/31/16 1521      Pre Biometrics   Height 5' 6.5" (1.689 m)   Weight 152 lb (68.9 kg)   Waist Circumference 35.25 inches   Hip Circumference 38.5 inches   Waist to Hip Ratio 0.92 %   BMI (Calculated) 24.2   Single Leg Stand 30 seconds       Nutrition Therapy Plan and Nutrition Goals:     Nutrition Therapy & Goals - 03/31/16 1652      Intervention Plan   Intervention Prescribe, educate and counsel regarding individualized specific dietary modifications aiming towards targeted core components such as  weight, hypertension, lipid management, diabetes, heart failure and other comorbidities.   Expected Outcomes Short Term Goal: Understand basic principles of dietary content, such as calories, fat, sodium, cholesterol and nutrients.;Short Term Goal: A plan has been developed with personal nutrition goals set during dietitian appointment.;Long Term Goal: Adherence to prescribed nutrition plan.      Nutrition Discharge: Rate Your Plate Scores:     Nutrition Assessments - 04/28/16 1537      Rate Your Plate Scores   Pre Score 71   Pre Score % 78.8 %      Nutrition Goals Re-Evaluation:   Psychosocial: Target Goals: Acknowledge presence or absence of depression, maximize coping skills, provide positive support system. Participant is able to verbalize types and ability to use techniques and skills needed for reducing stress and depression.  Initial Review & Psychosocial Screening:     Initial Psych Review & Screening - 03/31/16 1654      Initial Review   Current issues with Current Stress Concerns  Concerned about daughter with problems that are stressful   Comments Family conerns is causing stress     Family Dynamics   Good Support System? Yes  Wife, family and church     Barriers   Psychosocial barriers to participate in program There are no identifiable barriers or psychosocial needs.;The patient should benefit from training in stress management and relaxation.     Screening Interventions   Interventions Encouraged to exercise      Quality of Life Scores:     Quality of Life - 03/31/16 1434      Quality of Life Scores   Health/Function Pre 22.57 %   Socioeconomic Pre 22 %   Psych/Spiritual Pre 29.64 %   Family Pre 13.2 %   GLOBAL Pre 22.53 %      PHQ-9: Recent Review Flowsheet Data    [facility]   Decreased Interest 0   Down, Depressed, Hopeless 0   PHQ -  2 Score 0   Altered sleeping 0   Tired, decreased energy 3   Change in  appetite 0   Feeling bad or failure about yourself  0   Trouble concentrating 3   Moving slowly or fidgety/restless 0   Suicidal thoughts 0   PHQ-9 Score 6   Difficult doing work/chores Somewhat difficult      Psychosocial Evaluation and Intervention:     Psychosocial Evaluation - 04/07/16 0947      Psychosocial Evaluation & Interventions   Interventions Relaxation education;Encouraged to exercise with the program and follow exercise prescription   Comments Counselor met with Tyler Russell for initial psychosocial evaluation.  Tyler Russell is a 60 year old who had open heart surgery with (7) blockages last month.  Tyler Russell has a strong support system with a spouse of 26 years and (2) adult daughters who live close by.  Tyler Russell is also actively a part of his local church community.  Tyler Russell states Tyler Russell sleeps well and has a good appetite.  Tyler Russell denies a history of depression or anxiety or any current symptoms.  Tyler Russell states Tyler Russell is typically in a positive mood although Tyler Russell has several stressors; with a daughter who is dealing with addictions and Tyler Russell own finances due to medical bills and being on disability.  Tyler Russell has goals to get healthier and increase his energy levels.  Tyler Russell has been walking 1.5 miles every day and has a [employer] at home that Tyler Russell plans to use to maintain consistency in exercising following this program.  Counselor encouraged Tyler Russell to get his eyes checked soon since Tyler Russell complains of having difficulty reading or watching TV.  Counselor will continue to follow with Tyler Russell throughout the course of this program.       Psychosocial Re-Evaluation:     Psychosocial Re-Evaluation    [place] Name 04/23/16 [phone]             Psychosocial Re-Evaluation   Comments Counselor follow up today with Tyler Russell reporting feeling better and stronger since coming into this program.  Tyler Russell reports the stress with his daughter has decreased and other than some dizziness lately Tyler Russell is doing much better.   Tyler Russell is  supposed to see his Dr. next week re: his blood pressure medication which Tyler Russell thinks is contributing to the dizziness.  Tyler Russell will report back after this appointment.  Tyler Russell also pointed out a tender spot on his neck/upper back that has been there for awhile and is more pronounced lately.  Tyler Russell will check with his doctor on this as well.            Vocational Rehabilitation: Provide vocational rehab assistance to qualifying candidates.   Vocational Rehab Evaluation & Intervention:     Vocational Rehab - 03/31/16 1702      Initial Vocational Rehab Evaluation & Intervention   Assessment shows need for Vocational Rehabilitation No      Education: Education Goals: Education classes will be provided on a weekly basis, covering required topics. Participant will state understanding/return demonstration of topics presented.  Learning Barriers/Preferences:     Learning Barriers/Preferences - 03/31/16 1701      Learning Barriers/Preferences   Learning Barriers Sight;Hearing;Inability to learn new things;Reading  left school at 7 th grade, always trouble learning.  Worked ever since left school.    Learning Preferences Individual Instruction      Education Topics: General Nutrition Guidelines/Fats and Fiber: -Group instruction provided by verbal, written material,  models and posters to present the general guidelines for heart healthy nutrition. Gives an explanation and review of dietary fats and fiber. Flowsheet Row Cardiac Rehab from 04/28/2016 in [facility] Cardiac and Pulmonary Rehab  Date  04/07/16  Educator  CR  Instruction Review Code  2- meets goals/outcomes      Controlling Sodium/Reading Food Labels: -Group verbal and written material supporting the discussion of sodium use in heart healthy nutrition. Review and explanation with models, verbal and written materials for utilization of the food label. Flowsheet Row Cardiac Rehab from 04/28/2016 in [facility] Cardiac and Pulmonary Rehab  Date   04/21/16  Educator  CR  Instruction Review Code  2- meets goals/outcomes      Exercise Physiology & Risk Factors: - Group verbal and written instruction with models to review the exercise physiology of the cardiovascular system and associated critical values. Details cardiovascular disease risk factors and the goals associated with each risk factor.   Aerobic Exercise & Resistance Training: - Gives group verbal and written discussion on the health impact of inactivity. On the components of aerobic and resistive training programs and the benefits of this training and how to safely progress through these programs. Flowsheet Row Cardiac Rehab from 04/28/2016 in [facility] Cardiac and Pulmonary Rehab  Date  04/23/16  Educator  [facility]  Instruction Review Code  2- meets goals/outcomes      Flexibility, Balance, General Exercise Guidelines: - Provides group verbal and written instruction on the benefits of flexibility and balance training programs. Provides general exercise guidelines with specific guidelines to those with heart or lung disease. Demonstration and skill practice provided. Flowsheet Row Cardiac Rehab from 04/28/2016 in [facility] Cardiac and Pulmonary Rehab  Date  04/28/16  Educator  [facility]  Instruction Review Code  2- meets goals/outcomes      Stress Management: - Provides group verbal and written instruction about the health risks of elevated stress, cause of high stress, and healthy ways to reduce stress.   Depression: - Provides group verbal and written instruction on the correlation between heart/lung disease and depressed mood, treatment options, and the stigmas associated with seeking treatment.   Anatomy & Physiology of the Heart: - Group verbal and written instruction and models provide basic cardiac anatomy and physiology, with the coronary electrical and arterial systems. Review of: AMI, Angina, Valve disease, Heart Failure, Cardiac Arrhythmia, Pacemakers, and the ICD.   Cardiac  Procedures: - Group verbal and written instruction and models to describe the testing methods done to diagnose heart disease. Reviews the outcomes of the test results. Describes the treatment choices: Medical Management, Angioplasty, or Coronary Bypass Surgery.   Cardiac Medications: - Group verbal and written instruction to review commonly prescribed medications for heart disease. Reviews the medication, class of the drug, and side effects. Includes the steps to properly store meds and maintain the prescription regimen.   Go Sex-Intimacy & Heart Disease, Get SMART - Goal Setting: - Group verbal and written instruction through game format to discuss heart disease and the return to sexual intimacy. Provides group verbal and written material to discuss and apply goal setting through the application of the S.M.A.R.T. Method.   Other Matters of the Heart: - Provides group verbal, written materials and models to describe Heart Failure, Angina, Valve Disease, and Diabetes in the realm of heart disease. Includes description of the disease process and treatment options available to the cardiac patient.   Exercise & Equipment Safety: - Individual verbal instruction and demonstration of equipment use and safety  with use of the equipment. Flowsheet Row Cardiac Rehab from 04/28/2016 in [facility] Cardiac and Pulmonary Rehab  Date  03/31/16  Educator  sb  Instruction Review Code  2- meets goals/outcomes      Infection Prevention: - Provides verbal and written material to individual with discussion of infection control including proper hand washing and proper equipment cleaning during exercise session. Flowsheet Row Cardiac Rehab from 04/28/2016 in [facility] Cardiac and Pulmonary Rehab  Date  03/31/16  Educator  sb  Instruction Review Code  2- meets goals/outcomes      Falls Prevention: - Provides verbal and written material to individual with discussion of falls prevention and safety.   Diabetes: -  Individual verbal and written instruction to review signs/symptoms of diabetes, desired ranges of glucose level fasting, after meals and with exercise. Advice that pre and post exercise glucose checks will be done for 3 sessions at entry of program. [place] from 04/28/2016 in [facility] Cardiac and Pulmonary Rehab  Date  03/31/16  Educator  sb  Instruction Review Code  2- meets goals/outcomes       Knowledge Questionnaire Score:   Core Components/Risk Factors/Patient Goals at Admission:     Personal Goals and Risk Factors at Admission - 03/31/16 1652      Core Components/Risk Factors/Patient Goals on Admission   Increase Strength and Stamina Yes   Intervention Provide advice, education, support and counseling about physical activity/exercise needs.;Develop an individualized exercise prescription for aerobic and resistive training based on initial evaluation findings, risk stratification, comorbidities and participant's personal goals.   Expected Outcomes Achievement of increased cardiorespiratory fitness and enhanced flexibility, muscular endurance and strength shown through measurements of functional capacity and personal statement of participant.   Diabetes Yes   Intervention Provide education about signs/symptoms and action to take for hypo/hyperglycemia.;Provide education about proper nutrition, including hydration, and aerobic/resistive exercise prescription along with prescribed medications to achieve blood glucose in normal ranges: Fasting glucose 65-99 mg/dL   Expected Outcomes Short Term: Participant verbalizes understanding of the signs/symptoms and immediate care of hyper/hypoglycemia, proper foot care and importance of medication, aerobic/resistive exercise and nutrition plan for blood glucose control.;Long Term: Attainment of HbA1C < 7%.   Hypertension Yes   Intervention Provide education on lifestyle modifcations including regular physical activity/exercise, weight  management, moderate sodium restriction and increased consumption of fresh fruit, vegetables, and low fat dairy, alcohol moderation, and smoking cessation.;Monitor prescription use compliance.   Expected Outcomes Short Term: Continued assessment and intervention until BP is < 140/39m HG in hypertensive participants. < 130/828mHG in hypertensive participants with diabetes, heart failure or chronic kidney disease.;Long Term: Maintenance of blood pressure at goal levels.   Lipids Yes   Intervention Provide education and support for participant on nutrition & aerobic/resistive exercise along with prescribed medications to achieve LDL <7076mHDL >22m73m Expected Outcomes Short Term: Participant states understanding of desired cholesterol values and is compliant with medications prescribed. Participant is following exercise prescription and nutrition guidelines.;Long Term: Cholesterol controlled with medications as prescribed, with individualized exercise RX and with personalized nutrition plan. Value goals: LDL < 70mg34mL > 40 mg.   Stress Yes   Intervention Offer individual and/or small group education and counseling on adjustment to heart disease, stress management and health-related lifestyle change. Teach and support self-help strategies.;Refer participants experiencing significant psychosocial distress to appropriate mental health specialists for further evaluation and treatment. When possible, include family members and significant others in education/counseling sessions.   Expected Outcomes  Short Term: Participant demonstrates changes in health-related behavior, relaxation and other stress management skills, ability to obtain effective social support, and compliance with psychotropic medications if prescribed.;Long Term: Emotional wellbeing is indicated by absence of clinically significant psychosocial distress or social isolation.      Core Components/Risk Factors/Patient Goals Review:       Goals and Risk Factor Review    Row Name 04/14/16 0917 04/16/16 1018           Core Components/Risk Factors/Patient Goals Review   Personal Goals Review Weight Management/Obesity;Sedentary;Heart Failure;Diabetes;Stress;Hypertension;Lipids Lipids;Stress;Hypertension;Diabetes      Review Tyler Russell is off to a good start in the program.  Tyler Russell  feeling a little stronger and a little more stamina after a week in.  Tyler Russell does not check his blood pressure at home, but it has been good while here at rehab.  His blood sugars have all been under 150s.  Tyler Russell has had no problems with his statins.  Tyler Russell also said that Tyler Russell has no more stress. Tyler Russell blood sugars have all been under 200 recently.  Tyler Russell is not checking his blood pressure at home, but it has been good in rehab (120s resting). Tyler Russell has not had any recent blood work and continues to take his statin.  And Tyler Russell says that Tyler Russell does not have any stress.      Expected Outcomes Tyler Russell will continue to come to class for education and exercise.  We will continue to monitor his blood pressure.  Since his blood sugars have been good we will stop checking while here unless symptomatic. Tyler Russell will continue to come to exercise and education classes. We will continue to monitor his blood pressure.         Core Components/Risk Factors/Patient Goals at Discharge (Final Review):      Goals and Risk Factor Review - 04/16/16 1018      Core Components/Risk Factors/Patient Goals Review   Personal Goals Review Lipids;Stress;Hypertension;Diabetes   Review Tyler Russell blood sugars have all been under 200 recently.  Tyler Russell is not checking his blood pressure at home, but it has been good in rehab (120s resting). Tyler Russell has not had any recent blood work and continues to take his statin.  And Tyler Russell says that Tyler Russell does not have any stress.   Expected Outcomes Tyler Russell will continue to come to exercise and education classes. We will continue to monitor his blood pressure.      ITP Comments:     ITP  Comments    Row Name 03/31/16 1637 03/31/16 1643 03/31/16 1707 04/02/16 0837 04/07/16 0916   ITP Comments Medical review and Intitial ITP completed. Documentation of diagnosis Documentation of diagnosis in [place]  03/04/2016 Medical review and Initial ITP completed today 30 day review. Continue with ITP. HAs completed med review and will start sessions next week After exercise was completed, Tyler Russell complained of feeling dizzy.  Tyler Russell was seated and checked.  Blood pressure was 96/54.  Tyler Russell was given 2 cups of water to increase blood pressure to 104/60.  Tyler Russell was able to do weights and was dizzy again after cool down.  Blood pressure was 92/52.  Then after water 96/60, CBG 96. Pt felt better and was able to sit through education.   [place] Name 04/30/16 0744           ITP Comments 30 day review. Continue with ITP unless changes noted by Medical Director at signature of review.          Comments:

## 2016-04-30 NOTE — Progress Notes (Signed)
Daily Session Note  Patient Details  Name: Tyler Russell MRN: [id number] Date of Birth: [birth year] Referring Provider:   Flowsheet Row Cardiac Rehab from 03/31/2016 in [facility] Cardiac and Pulmonary Rehab  Referring Provider  Bartle      Encounter Date: 04/30/2016  Check In:     Session Check In - 04/30/16 0834      Check-In   Location [facility]-Cardiac & Pulmonary Rehab   Staff Present Alberteen Sam, MA, ACSM RCEP, Exercise Physiologist;Amanda Oletta Darter, BA, ACSM CEP, Exercise Physiologist;Carroll Enterkin, RN, BSN   Supervising physician immediately available to respond to emergencies See telemetry face sheet for immediately available ER MD   Medication changes reported     No   Fall or balance concerns reported    No   Warm-up and Cool-down Performed on first and last piece of equipment   Resistance Training Performed Yes   VAD Patient? No     Pain Assessment   Currently in Pain? No/denies         Goals Met:  Independence with exercise equipment Exercise tolerated well No report of cardiac concerns or symptoms Strength training completed today  Goals Unmet:  Not Applicable  Comments: Reviewed home exercise with pt today.  Pt plans to walk and use total gym for exercise.  Reviewed THR, pulse, RPE, sign and symptoms, NTG use, and when to call 911 or MD.  Also discussed weather considerations and indoor options.  Pt voiced understanding.  Dr. Emily Filbert is Medical Director for [place] and [facility].

## 2016-05-05 ENCOUNTER — Encounter: Payer: Medicare Other | Admitting: *Deleted

## 2016-05-05 DIAGNOSIS — Z951 Presence of aortocoronary bypass graft: Secondary | ICD-10-CM

## 2016-05-05 DIAGNOSIS — I214 Non-ST elevation (NSTEMI) myocardial infarction: Secondary | ICD-10-CM

## 2016-05-05 NOTE — Progress Notes (Signed)
Daily Session Note  Patient Details  Name: KINGSTEN ENFIELD MRN: [id number] Date of Birth: Nov 23, 1955 Referring Provider:   Flowsheet Row Cardiac Rehab from 03/31/2016 in [facility] Cardiac and Pulmonary Rehab  Referring Provider  Bartle      Encounter Date: 05/05/2016  Check In:     Session Check In - 05/05/16 0756      Check-In   Location [facility]-Cardiac & Pulmonary Rehab   Staff Present Heath Lark, RN, BSN, Laveda Norman, BS, ACSM CEP, Exercise Physiologist;Jessica Kunkle, [place], ACSM RCEP, Exercise Physiologist   Supervising physician immediately available to respond to emergencies See telemetry face sheet for immediately available ER MD   Medication changes reported     No   Fall or balance concerns reported    No   Warm-up and Cool-down Performed on first and last piece of equipment   Resistance Training Performed Yes   VAD Patient? No     Pain Assessment   Currently in Pain? No/denies   Multiple Pain Sites No         Goals Met:  Independence with exercise equipment Exercise tolerated well No report of cardiac concerns or symptoms  Goals Unmet:  Not Applicable  Comments: Doing well with exercise prescription progression.    Dr. Emily Filbert is Medical Director for [place] and [facility].

## 2016-05-05 NOTE — Progress Notes (Signed)
Daily Session Note  Patient Details  Name: Tyler Russell MRN: [id number] Date of Birth: 01/14/1956 Referring Provider:   Flowsheet Row Cardiac Rehab from 03/31/2016 in [facility] Cardiac and Pulmonary Rehab  Referring Provider  Bartle      Encounter Date: 05/05/2016  Check In:     Session Check In - 05/05/16 0756      Check-In   Location [facility]-Cardiac & Pul[facility] Rehab   Staff Present Susanne Bice, RN, BSN, CCRP; , BS, ACSM CEP, Exercise Physiologist;Jessica Hawkins, MA, ACSM RCEP, Exercise Physiologist   Supervising physician immediately available to respond to emergencies See telemetry face sheet for immediately available ER MD   Medication changes reported     No   Fall or balance concerns reported    No   Warm-up and Cool-down Performed on first and last piece of equipment   Resistance Training Performed Yes   VAD Patient? No     Pain Assessment   Currently in Pain? No/denies   Multiple Pain Sites No         Goals Met:  Independence with exercise equipment Exercise tolerated well No report of cardiac concerns or symptoms Strength training completed today  Goals Unmet:  Not Applicable  Comments: Pt able to follow exercise prescription today without complaint.  Will continue to monitor for progression.    Dr. Mark Miller is Medical Director for [facility] and [facility]. 

## 2016-05-12 ENCOUNTER — Encounter: Payer: Medicare Other | Admitting: *Deleted

## 2016-05-12 DIAGNOSIS — I214 Non-ST elevation (NSTEMI) myocardial infarction: Secondary | ICD-10-CM

## 2016-05-12 DIAGNOSIS — Z951 Presence of aortocoronary bypass graft: Secondary | ICD-10-CM | POA: Diagnosis not present

## 2016-05-12 NOTE — Progress Notes (Signed)
Daily Session Note  Patient Details  Name: Tyler Russell MRN: [id number] Date of Birth: 29-May-1956 Referring Provider:   Flowsheet Row Cardiac Rehab from 03/31/2016 in [facility] Cardiac and Pulmonary Rehab  Referring Provider  Bartle      Encounter Date: 05/12/2016  Check In:     Session Check In - 05/12/16 0806      Check-In   Location [facility]-Cardiac & [facility]monary Rehab   Staff Present Heath Lark, RN, BSN, Laveda Norman, BS, ACSM CEP, Exercise Physiologist;Jessica Kingston, [place], ACSM RCEP, Exercise Physiologist   Supervising physician immediately available to respond to emergencies See telemetry face sheet for immediately available ER MD   Medication changes reported     No   Fall or balance concerns reported    No   Warm-up and Cool-down Performed on first and last piece of equipment   Resistance Training Performed Yes   VAD Patient? No     Pain Assessment   Currently in Pain? No/denies   Multiple Pain Sites No         Goals Met:  Independence with exercise equipment Exercise tolerated well No report of cardiac concerns or symptoms Strength training completed today  Personal goals reviewed  Goals Unmet:  Not Applicable  Comments: Pt able to follow exercise prescription today without complaint.  Will continue to monitor for progression.    Dr. Emily Filbert is Medical Director for [place] and [facility].

## 2016-05-14 DIAGNOSIS — Z951 Presence of aortocoronary bypass graft: Secondary | ICD-10-CM | POA: Diagnosis not present

## 2016-05-14 DIAGNOSIS — I214 Non-ST elevation (NSTEMI) myocardial infarction: Secondary | ICD-10-CM

## 2016-05-14 NOTE — Progress Notes (Signed)
Daily Session Note  Patient Details  Name: Tyler Russell MRN: [id number] Date of Birth: [birth year] Referring Provider:   Flowsheet Row Cardiac Rehab from 03/31/2016 in [facility] Cardiac and Pulmonary Rehab  Referring Provider  Bartle      Encounter Date: 05/14/2016  Check In:     Session Check In - 05/14/16 0756      Check-In   Location [facility]-Cardiac & Pulmonary Rehab   Staff Present Nyoka Cowden, RN, BSN, Willette Pa, MA, ACSM RCEP, Exercise Physiologist;Amanda Oletta Darter, [place], ACSM CEP, Exercise Physiologist   Supervising physician immediately available to respond to emergencies See telemetry face sheet for immediately available ER MD   Medication changes reported     No   Fall or balance concerns reported    No   Warm-up and Cool-down Performed on first and last piece of equipment   Resistance Training Performed Yes   VAD Patient? No     Pain Assessment   Currently in Pain? No/denies   Multiple Pain Sites No         Goals Met:  Independence with exercise equipment Exercise tolerated well No report of cardiac concerns or symptoms Strength training completed today  Goals Unmet:  Not Applicable  Comments: Pt able to follow exercise prescription today without complaint.  Will continue to monitor for progression.    Dr. Emily Filbert is Medical Director for [place] and [facility].

## 2016-05-16 ENCOUNTER — Encounter: Payer: Medicare Other | Attending: Surgery

## 2016-05-16 DIAGNOSIS — I214 Non-ST elevation (NSTEMI) myocardial infarction: Secondary | ICD-10-CM | POA: Insufficient documentation

## 2016-05-16 DIAGNOSIS — Z951 Presence of aortocoronary bypass graft: Secondary | ICD-10-CM | POA: Insufficient documentation

## 2016-05-21 DIAGNOSIS — Z951 Presence of aortocoronary bypass graft: Secondary | ICD-10-CM | POA: Diagnosis not present

## 2016-05-21 DIAGNOSIS — I214 Non-ST elevation (NSTEMI) myocardial infarction: Secondary | ICD-10-CM | POA: Diagnosis not present

## 2016-05-21 NOTE — Progress Notes (Signed)
Daily Session Note  Patient Details  Name: Tyler Russell MRN: [id number] Date of Birth: 03/04/1956 Referring Provider:   Flowsheet Row Cardiac Rehab from 03/31/2016 in [facility] Cardiac and Pulmonary Rehab  Referring Provider  Bartle      Encounter Date: 05/21/2016  Check In:     Session Check In - 05/21/16 0858      Check-In   Location [facility] & [facility] Re[facility]   Staff Present Susanne Bice, RN, BSN, CCRP;Jessica Hawkins, MA, ACSM RCEP, Exercise Physiologist;Amanda Sommer, BA, ACSM CEP, Exercise Physiologist   Supervising physician immediately available to respond to emergencies See telemetry face sheet for immediately available ER MD   Medication changes reported     No   Fall or balance concerns reported    No   Warm-up and Cool-down Performed on first and last piece of equipment   Resistance Training Performed Yes   VAD Patient? No     Pain Assessment   Currently in Pain? No/denies   Multiple Pain Sites No         Goals Met:  Independence with exercise equipment Exercise tolerated well No report of cardiac concerns or symptoms Strength training completed today  Goals Unmet:  Not Applicable  Comments: Pt able to follow exercise prescription today without complaint.  Will continue to monitor for progression.  Tyler Russell's blood pressure was low checking in, 90/50.  We gave him some water and blood pressure came up to 102.  Dr. Mark Miller is Medical Director for [facility] and [facility]. 

## 2016-05-23 ENCOUNTER — Encounter: Payer: Medicare Other | Admitting: *Deleted

## 2016-05-23 DIAGNOSIS — Z951 Presence of aortocoronary bypass graft: Secondary | ICD-10-CM | POA: Diagnosis not present

## 2016-05-23 DIAGNOSIS — I214 Non-ST elevation (NSTEMI) myocardial infarction: Secondary | ICD-10-CM | POA: Diagnosis not present

## 2016-05-23 NOTE — Progress Notes (Signed)
Daily Session Note  Patient Details  Name: Tyler Russell MRN: [id number] Date of Birth: 10/07/1955 Referring Provider:   Flowsheet Row Cardiac Rehab from 03/31/2016 in [facility] Cardiac and Pulmonary Rehab  Referring Provider  Bartle      Encounter Date: 05/23/2016  Check In:     Session Check In - 05/23/16 0829      Check-In   Location [facility]-Cardiac & Pulmonary Rehab   Staff Present Gerlene Burdock, RN, Levie Heritage, MA, ACSM RCEP, Exercise Physiologist   Medication changes reported     No   Fall or balance concerns reported    No   Resistance Training Performed Yes   VAD Patient? No     Pain Assessment   Currently in Pain? No/denies         Goals Met:  Independence with exercise equipment No report of cardiac concerns or symptoms  Goals Unmet:  Not Applicable  Comments:     Dr. Emily Filbert is Medical Director for [place] and [facility].

## 2016-05-26 ENCOUNTER — Encounter: Payer: Medicare Other | Admitting: *Deleted

## 2016-05-26 DIAGNOSIS — I214 Non-ST elevation (NSTEMI) myocardial infarction: Secondary | ICD-10-CM

## 2016-05-26 DIAGNOSIS — Z951 Presence of aortocoronary bypass graft: Secondary | ICD-10-CM

## 2016-05-26 NOTE — Progress Notes (Signed)
Daily Session Note  Patient Details  Name: Tyler Russell MRN: [id number] Date of Birth: [birth year] Referring Provider:   Flowsheet Row Cardiac Rehab from 03/31/2016 in [facility] Cardiac and Pulmonary Rehab  Referring Provider  Bartle      Encounter Date: 05/26/2016  Check In:     Session Check In - 05/26/16 0747      Check-In   Location [facility]-Cardiac & Pulmonary Rehab   Staff Present Gerlene Burdock, RN, Moises Blood, BS, ACSM CEP, Exercise Physiologist;Jessica Luan [place], [place], ACSM RCEP, Exercise Physiologist   Supervising physician immediately available to respond to emergencies See telemetry face sheet for immediately available ER MD   Medication changes reported     No   Fall or balance concerns reported    No   Warm-up and Cool-down Performed on first and last piece of equipment   Resistance Training Performed Yes   VAD Patient? No     Pain Assessment   Currently in Pain? No/denies   Multiple Pain Sites No         Goals Met:  Independence with exercise equipment Exercise tolerated well No report of cardiac concerns or symptoms Strength training completed today  Goals Unmet:  Not Applicable  Comments: Pt able to follow exercise prescription today without complaint.  Will continue to monitor for progression.    Dr. Emily Filbert is Medical Director for [place] and [facility].

## 2016-05-28 ENCOUNTER — Encounter: Payer: Self-pay | Admitting: *Deleted

## 2016-05-28 DIAGNOSIS — Z951 Presence of aortocoronary bypass graft: Secondary | ICD-10-CM

## 2016-05-28 DIAGNOSIS — I214 Non-ST elevation (NSTEMI) myocardial infarction: Secondary | ICD-10-CM

## 2016-05-28 NOTE — Progress Notes (Signed)
Cardiac Individual Treatment Plan  Patient Details  Name: Tyler Russell MRN: [id number] Date of Birth: 10/25/1955 Referring Provider:   Flowsheet Row Cardiac Rehab from 03/31/2016 in [facility] Cardiac and Pulmonary Rehab  Referring Provider  Bartle      Initial Encounter Date:  Flowsheet Row Cardiac Rehab from 03/31/2016 in [facility] Cardiac and Pulmonary Rehab  Date  03/31/16  Referring Provider  Bartle      Visit Diagnosis: NSTEMI (non-ST elevated myocardial infarction) ([place])  S/P CABG x 3  Patient's Home Medications on Admission:  Current Outpatient Prescriptions:  .  aspirin 81 MG tablet, Take 81 mg by mouth daily., Disp: , Rfl:  .  aspirin EC 325 MG EC tablet, Take 1 tablet (325 mg total) by mouth daily. (Patient not taking: Reported on 03/31/2016), Disp: 30 tablet, Rfl: 0 .  atorvastatin (LIPITOR) 40 MG tablet, Take 1 tablet (40 mg total) by mouth daily at 6 PM. (Patient not taking: Reported on 03/31/2016), Disp: 30 tablet, Rfl: 3 .  blood glucose meter kit and supplies, Dispense based on patient and insurance preference. Use up to four times daily as directed. (FOR ICD-9 250.00, 250.01)., Disp: 1 each, Rfl: 0 .  gemfibrozil (LOPID) 600 MG tablet, Take 600 mg by mouth 2 (two) times daily before a meal., Disp: , Rfl:  .  lisinopril (ZESTRIL) 2.5 MG tablet, Take 1 tablet (2.5 mg total) by mouth daily., Disp: 30 tablet, Rfl: 3 .  metFORMIN (GLUCOPHAGE) 850 MG tablet, Take 1 tablet (850 mg total) by mouth 2 (two) times daily with a meal. (Patient not taking: Reported on 04/02/2016), Disp: 60 tablet, Rfl: 3 .  metoprolol (LOPRESSOR) 50 MG tablet, Take 1 tablet (50 mg total) by mouth 2 (two) times daily., Disp: 60 tablet, Rfl: 3 .  omeprazole (PRILOSEC) 20 MG capsule, Take 20 mg by mouth daily. , Disp: , Rfl:  .  traMADol (ULTRAM) 50 MG tablet, Take 1-2 tablets (50-100 mg total) by mouth every 4 (four) hours as needed for moderate pain. (Patient not taking: Reported on 04/02/2016), Disp: 30  tablet, Rfl: 0  Past Medical History: Past Medical History:  Diagnosis Date  . Alcohol abuse   . Arthritis   . Chronic back pain   . Chronic kidney disease   . Complication of anesthesia    had difficulty with breathing, had difficulty waking up  . Gonorrhea   . Headache(784.0)   . Hypercholesteremia    no medication at this time  . Hypertension   . Trigger finger    on both hands  . Ulcer    on medication    Tobacco Use: History  Smoking Status  . Former Smoker  . Types: Cigarettes  Smokeless Tobacco  . Not on file    Comment: stopped at age 43 years    Labs: Recent Review Flowsheet Data    Labs for ITP Cardiac and Pulmonary Rehab Latest Ref Rng & Units 02/29/2016 02/29/2016 02/29/2016 02/29/2016 03/01/2016   Cholestrol 0 - 200 mg/dL - - - - -   LDLCALC 0 - 99 mg/dL - - - - -   HDL >40 mg/dL - - - - -   Trlycerides <150 mg/dL - - - - -   Hemoglobin A1c 4.8 - 5.6 % - - - - -   PHART 7.350 - 7.450 7.424 7.312(L) 7.328(L) - -   PCO2ART 35.0 - 45.0 mmHg 34.2(L) 40.7 37.8 - -   HCO3 20.0 - 24.0 mEq/L 22.7 20.8 19.9(L) - -  TCO2 0 - 100 mmol/L '24 22 21 23 23   '$ ACIDBASEDEF 0.0 - 2.0 mmol/L 2.0 5.0(H) 6.0(H) - -   O2SAT % 97.0 98.0 98.0 - -       Exercise Target Goals:    Exercise Program Goal: Individual exercise prescription set with THRR, safety & activity barriers. Participant demonstrates ability to understand and report RPE using BORG scale, to self-measure pulse accurately, and to acknowledge the importance of the exercise prescription.  Exercise Prescription Goal: Starting with aerobic activity 30 plus minutes a day, 3 days per week for initial exercise prescription. Provide home exercise prescription and guidelines that participant acknowledges understanding prior to discharge.  Activity Barriers & Risk Stratification:     Activity Barriers & Cardiac Risk Stratification - 03/31/16 1659      Activity Barriers & Cardiac Risk Stratification   Activity  Barriers Arthritis;Shortness of Breath;Joint Problems;Deconditioning  arthritis /joints- back, elbows,shoulders. SOB"I think is more sinus since my surgery"   Cardiac Risk Stratification High      6 Minute Walk:     6 Minute Walk    Row Name 03/31/16 1511         6 Minute Walk   Distance 1422 feet     Walk Time 6 minutes     MPH 2.7     METS 4     RPE 11     VO2 Peak 14.04     Symptoms No     Resting HR 78 bpm     Max Ex. HR 112 bpm     Max Ex. BP 132/64        Initial Exercise Prescription:     Initial Exercise Prescription - 03/31/16 1500      Date of Initial Exercise RX and Referring Provider   Date 03/31/16   Referring Provider Bartle     Treadmill   MPH 2.5   Grade 2.5   Minutes 15   METs 3.78     Recumbant Bike   Level 3   RPM 60   Watts 40   Minutes 15   METs 3.8     NuStep   Level 3   Watts 60   Minutes 15   METs 3.8     Elliptical   Level 1   Speed 50   Minutes 5     REL-XR   Level 3   Watts 60   Minutes 15   METs 3.8     T5 Nustep   Level 2   Watts 60   Minutes 15   METs 3.8     Prescription Details   Frequency (times per week) 3   Duration Progress to 30 minutes of continuous aerobic without signs/symptoms of physical distress     Intensity   THRR 40-80% of Max Heartrate 111-144   Ratings of Perceived Exertion 11-13     Progression   Progression Continue to progress workloads to maintain intensity without signs/symptoms of physical distress.     Resistance Training   Training Prescription Yes   Weight 3      Perform Capillary Blood Glucose checks as needed.  Exercise Prescription Changes:     Exercise Prescription Changes    Row Name 03/31/16 1230 04/08/16 1300 04/22/16 1500 04/30/16 0800 05/08/16 1100     Exercise Review   Progression  - No  First full day of exercise Yes  - Yes     Response to Exercise   Blood Pressure (Admit)  -  122/66 112/64  - 114/60   Blood Pressure (Exercise) 132/64 136/64 136/70   - 128/64   Blood Pressure (Exit) 1'14/70 92/52 94/50 '$  - 104/62   Heart Rate (Admit) 67 bpm 71 bpm 78 bpm  - 73 bpm   Heart Rate (Exercise) 112 bpm 124 bpm 123 bpm  - 109 bpm   Heart Rate (Exit) 73 bpm 70 bpm 77 bpm  - 83 bpm   Rating of Perceived Exertion (Exercise) '11 13 13  '$ - 10   Symptoms  - Dizziness after elliptical and after weights  -  -  -   Comments  -  -  -  - Home Exercise Guidelines given 04/30/16   Duration  - Progress to 45 minutes of aerobic exercise without signs/symptoms of physical distress Progress to 45 minutes of aerobic exercise without signs/symptoms of physical distress  - Progress to 45 minutes of aerobic exercise without signs/symptoms of physical distress   Intensity  - THRR unchanged THRR unchanged  - THRR unchanged     Progression   Progression  - Continue to progress workloads to maintain intensity without signs/symptoms of physical distress. Continue to progress workloads to maintain intensity without signs/symptoms of physical distress.  - Continue to progress workloads to maintain intensity without signs/symptoms of physical distress.   Average METs  - 4.35 3.96  - 4.64     Resistance Training   Training Prescription  - Yes Yes  - Yes   Weight  - 3 3 lbs  - 5 lbs   Reps  - 10-12 10-15  - 10-15     Interval Training   Interval Training  - No No  - No     Treadmill   MPH  -  - 2.5  - 3   Grade  -  - 2.5  - 3   Minutes  -  - 15  - 15   METs  -  - 3.78  - 4.54     Elliptical   Level  - 1 1  - 1   Speed  - 3 4.3  - 4.3   Minutes  - 15 15  - 15     T5 Nustep   Level  - 2 2  - 4   Minutes  - 15 15  - 15   METs  - 2.2 2.1  - 2.5     Home Exercise Plan   Plans to continue exercise at  -  -  - Home Home  walking and total gym   Frequency  -  -  - Add 2 additional days to program exercise sessions. Add 2 additional days to program exercise sessions.   [place] Name 05/21/16 1500             Exercise Review   Progression Yes         Response to  Exercise   Blood Pressure (Admit) 90/58  rck 104/54 after water       Blood Pressure (Exercise) 142/64       Blood Pressure (Exit) 120/70       Heart Rate (Admit) 77 bpm       Heart Rate (Exercise) 126 bpm       Heart Rate (Exit) 89 bpm       Rating of Perceived Exertion (Exercise) 12       Symptoms none       Comments Home Exercise Guidelines given 04/30/16  Duration Progress to 45 minutes of aerobic exercise without signs/symptoms of physical distress       Intensity THRR unchanged         Progression   Progression Continue to progress workloads to maintain intensity without signs/symptoms of physical distress.       Average METs 5.52         Resistance Training   Training Prescription Yes       Weight 7 lbs       Reps 10-15         Interval Training   Interval Training No         Treadmill   MPH 3       Grade 3       Minutes 15       METs 4.54         Elliptical   Level 6       Speed 4.3       Minutes 15         T5 Nustep   Level 4       Minutes 15         Home Exercise Plan   Plans to continue exercise at Home  walking and total gym       Frequency Add 2 additional days to program exercise sessions.          Exercise Comments:     Exercise Comments    Row Name 03/31/16 1522 04/07/16 0916 04/11/16 1016 04/22/16 1525 04/30/16 0836   Exercise Comments Mr. Ebel has no injuries that affect exercise. First full day of exercise!  Patient was oriented to gym and equipment including functions, settings, policies, and procedures.  Patient's individual exercise prescription and treatment plan were reviewed.  All starting workloads were established based on the results of the 6 minute walk test done at initial orientation visit.  The plan for exercise progression was also introduced and progression will be customized based on patient's performance and goals. Reviewed METs average and discussed progression with pt today. Sidharth is off to a good start with exercise.   He is already doing 4.3 mph on the elliptical.  We will continue to monitor for progression. Home exercise was reviewed with [place] today.   Row Name 04/30/16 [phone] 05/08/16 1139 05/21/16 0910 05/21/16 1542 05/26/16 0843   Exercise Comments  Reviewed home exercise with pt today.  Pt plans to walk and use total gym for exercise.  Reviewed THR, pulse, RPE, sign and symptoms, NTG use, and when to call 911 or MD.  Also discussed weather considerations and indoor options.  Pt voiced understanding. Cory is doing well with exercise in rehab.  He is already up to level 4 on the T5 NuStep.  We will continue to monitor for progression. Yandell's blood pressure was low checking in, 90/50.  We gave him some water and blood pressure came up to 102. Jerson continues to do well with exercise.  He is walking at home and using his total gym on his off days from rehab.  We will continue to monitor his progression. Reviewed METs average and discussed progression with pt today.      Discharge Exercise Prescription (Final Exercise Prescription Changes):     Exercise Prescription Changes - 05/21/16 1500      Exercise Review   Progression Yes     Response to Exercise   Blood Pressure (Admit) 90/58  rck 104/54 after water   Blood Pressure (Exercise) 142/64  Blood Pressure (Exit) 120/70   Heart Rate (Admit) 77 bpm   Heart Rate (Exercise) 126 bpm   Heart Rate (Exit) 89 bpm   Rating of Perceived Exertion (Exercise) 12   Symptoms none   Comments Home Exercise Guidelines given 04/30/16   Duration Progress to 45 minutes of aerobic exercise without signs/symptoms of physical distress   Intensity THRR unchanged     Progression   Progression Continue to progress workloads to maintain intensity without signs/symptoms of physical distress.   Average METs 5.52     Resistance Training   Training Prescription Yes   Weight 7 lbs   Reps 10-15     Interval Training   Interval Training No     Treadmill   MPH 3    Grade 3   Minutes 15   METs 4.54     Elliptical   Level 6   Speed 4.3   Minutes 15     T5 Nustep   Level 4   Minutes 15     Home Exercise Plan   Plans to continue exercise at Home  walking and total gym   Frequency Add 2 additional days to program exercise sessions.      Nutrition:  Target Goals: Understanding of nutrition guidelines, daily intake of sodium '1500mg'$ , cholesterol '200mg'$ , calories 30% from fat and 7% or less from saturated fats, daily to have 5 or more servings of fruits and vegetables.  Biometrics:     Pre Biometrics - 03/31/16 1521      Pre Biometrics   Height 5' 6.5" (1.689 m)   Weight 152 lb (68.9 kg)   Waist Circumference 35.25 inches   Hip Circumference 38.5 inches   Waist to Hip Ratio 0.92 %   BMI (Calculated) 24.2   Single Leg Stand 30 seconds       Nutrition Therapy Plan and Nutrition Goals:     Nutrition Therapy & Goals - 03/31/16 1652      Intervention Plan   Intervention Prescribe, educate and counsel regarding individualized specific dietary modifications aiming towards targeted core components such as weight, hypertension, lipid management, diabetes, heart failure and other comorbidities.   Expected Outcomes Short Term Goal: Understand basic principles of dietary content, such as calories, fat, sodium, cholesterol and nutrients.;Short Term Goal: A plan has been developed with personal nutrition goals set during dietitian appointment.;Long Term Goal: Adherence to prescribed nutrition plan.      Nutrition Discharge: Rate Your Plate Scores:     Nutrition Assessments - 04/28/16 1537      Rate Your Plate Scores   Pre Score 71   Pre Score % 78.8 %      Nutrition Goals Re-Evaluation:   Psychosocial: Target Goals: Acknowledge presence or absence of depression, maximize coping skills, provide positive support system. Participant is able to verbalize types and ability to use techniques and skills needed for reducing stress and  depression.  Initial Review & Psychosocial Screening:     Initial Psych Review & Screening - 03/31/16 1654      Initial Review   Current issues with Current Stress Concerns  Concerned about daughter with problems that are stressful   Comments Family conerns is causing stress     Family Dynamics   Good Support System? Yes  Wife, family and church     Barriers   Psychosocial barriers to participate in program There are no identifiable barriers or psychosocial needs.;The patient should benefit from training in stress management and relaxation.  Screening Interventions   Interventions Encouraged to exercise      Quality of Life Scores:     Quality of Life - 03/31/16 1434      Quality of Life Scores   Health/Function Pre 22.57 %   Socioeconomic Pre 22 %   Psych/Spiritual Pre 29.64 %   Family Pre 13.2 %   GLOBAL Pre 22.53 %      PHQ-9: Recent Review Flowsheet Data    Depression screen [facility] 2/9 03/31/2016   Decreased Interest 0   Down, Depressed, Hopeless 0   PHQ - 2 Score 0   Altered sleeping 0   Tired, decreased energy 3   Change in appetite 0   Feeling bad or failure about yourself  0   Trouble concentrating 3   Moving slowly or fidgety/restless 0   Suicidal thoughts 0   PHQ-9 Score 6   Difficult doing work/chores Somewhat difficult      Psychosocial Evaluation and Intervention:     Psychosocial Evaluation - 04/07/16 0947      Psychosocial Evaluation & Interventions   Interventions Relaxation education;Encouraged to exercise with the program and follow exercise prescription   Comments Counselor met with Mr. Keziah for initial psychosocial evaluation.  He is a 60 year old who had open heart surgery with (7) blockages last month.  He has a strong support system with a spouse of 57 years and (2) adult daughters who live close by.  He is also actively a part of his local church community.  Mr. Creamer states he sleeps well and has a good appetite.  He denies a  history of depression or anxiety or any current symptoms.  He states he is typically in a positive mood although he has several stressors; with a daughter who is dealing with addictions and Mr. Lurline Del own finances due to medical bills and being on disability.  Mr. Recinos has goals to get healthier and increase his energy levels.  He has been walking 1.5 miles every day and has a Total Gym at home that he plans to use to maintain consistency in exercising following this program.  Counselor encouraged Mr. Dugar to get his eyes checked soon since he complains of having difficulty reading or watching TV.  Counselor will continue to follow with Mr. Code throughout the course of this program.       Psychosocial Re-Evaluation:     Psychosocial Re-Evaluation    [place] Name 04/23/16 [phone] 05/21/16 0945           Psychosocial Re-Evaluation   Comments Counselor follow up today with Mr. Cecena reporting feeling better and stronger since coming into this program.  He reports the stress with his daughter has decreased and other than some dizziness lately he is doing much better.   Mr. Hoffer is supposed to see his Dr. next week re: his blood pressure medication which he thinks is contributing to the dizziness.  He will report back after this appointment.  He also pointed out a tender spot on his neck/upper back that has been there for awhile and is more pronounced lately.  He will check with his doctor on this as well.   Follow up with Mr. Saine today.  He reports great progress since beginning this program with keeping his weight down and being able to get off the medications for borderline diabetes.  He states he feels stronger and a family member mentioned he "looks younger" since starting this program.  Mr. Loletha Grayer states his  mood remains positive and he is able to do some part-time work lately as well that helps with financial stress and medical bills.  The situation with his daughter still remains a problem, but Mr. Loletha Grayer  reports he is allowing his wife to deal with it and that helps the relationship all around.  Mr. Loletha Grayer celebrated 10 years of marriage yesterday and stated he is feeling better about being here for more years now.  Counselor commended Mr. Savino on all his hard work and progress made.           Vocational Rehabilitation: Provide vocational rehab assistance to qualifying candidates.   Vocational Rehab Evaluation & Intervention:     Vocational Rehab - 03/31/16 1702      Initial Vocational Rehab Evaluation & Intervention   Assessment shows need for Vocational Rehabilitation No      Education: Education Goals: Education classes will be provided on a weekly basis, covering required topics. Participant will state understanding/return demonstration of topics presented.  Learning Barriers/Preferences:     Learning Barriers/Preferences - 03/31/16 1701      Learning Barriers/Preferences   Learning Barriers Sight;Hearing;Inability to learn new things;Reading  left school at 7 th grade, always trouble learning.  Worked ever since left school.    Learning Preferences Individual Instruction      Education Topics: General Nutrition Guidelines/Fats and Fiber: -Group instruction provided by verbal, written material, models and posters to present the general guidelines for heart healthy nutrition. Gives an explanation and review of dietary fats and fiber. Flowsheet Row Cardiac Rehab from 05/26/2016 in [facility] Cardiac and Pulmonary Rehab  Date  04/07/16  Educator  CR  Instruction Review Code  2- meets goals/outcomes      Controlling Sodium/Reading Food Labels: -Group verbal and written material supporting the discussion of sodium use in heart healthy nutrition. Review and explanation with models, verbal and written materials for utilization of the food label. Flowsheet Row Cardiac Rehab from 05/26/2016 in [facility] Cardiac and Pulmonary Rehab  Date  04/21/16  Educator  CR  Instruction Review Code  2-  meets goals/outcomes      Exercise Physiology & Risk Factors: - Group verbal and written instruction with models to review the exercise physiology of the cardiovascular system and associated critical values. Details cardiovascular disease risk factors and the goals associated with each risk factor.   Aerobic Exercise & Resistance Training: - Gives group verbal and written discussion on the health impact of inactivity. On the components of aerobic and resistive training programs and the benefits of this training and how to safely progress through these programs. Flowsheet Row Cardiac Rehab from 05/26/2016 in [facility] Cardiac and Pulmonary Rehab  Date  04/23/16  Educator  [facility]  Instruction Review Code  2- meets goals/outcomes      Flexibility, Balance, General Exercise Guidelines: - Provides group verbal and written instruction on the benefits of flexibility and balance training programs. Provides general exercise guidelines with specific guidelines to those with heart or lung disease. Demonstration and skill practice provided. Flowsheet Row Cardiac Rehab from 05/26/2016 in [facility] Cardiac and Pulmonary Rehab  Date  04/28/16  Educator  [facility]  Instruction Review Code  2- meets goals/outcomes      Stress Management: - Provides group verbal and written instruction about the health risks of elevated stress, cause of high stress, and healthy ways to reduce stress. Flowsheet Row Cardiac Rehab from 05/26/2016 in [facility] Cardiac and Pulmonary Rehab  Date  04/30/16  Educator  Lupita Leash  Instruction Review Code  2- meets goals/outcomes      Depression: - Provides group verbal and written instruction on the correlation between heart/lung disease and depressed mood, treatment options, and the stigmas associated with seeking treatment.   Anatomy & Physiology of the Heart: - Group verbal and written instruction and models provide basic cardiac anatomy and physiology, with the coronary electrical and arterial systems.  Review of: AMI, Angina, Valve disease, Heart Failure, Cardiac Arrhythmia, Pacemakers, and the ICD. Flowsheet Row Cardiac Rehab from 05/26/2016 in [facility] Cardiac and Pulmonary Rehab  Date  05/05/16  Educator  SB  Instruction Review Code  2- meets goals/outcomes      Cardiac Procedures: - Group verbal and written instruction and models to describe the testing methods done to diagnose heart disease. Reviews the outcomes of the test results. Describes the treatment choices: Medical Management, Angioplasty, or Coronary Bypass Surgery. Flowsheet Row Cardiac Rehab from 05/26/2016 in [facility] Cardiac and Pulmonary Rehab  Date  05/12/16  Educator  SB  Instruction Review Code  2- meets goals/outcomes      Cardiac Medications: - Group verbal and written instruction to review commonly prescribed medications for heart disease. Reviews the medication, class of the drug, and side effects. Includes the steps to properly store meds and maintain the prescription regimen. Flowsheet Row Cardiac Rehab from 05/26/2016 in [facility] Cardiac and Pulmonary Rehab  Date  05/21/16  Educator  SB  Instruction Review Code  2- meets goals/outcomes      Go Sex-Intimacy & Heart Disease, Get SMART - Goal Setting: - Group verbal and written instruction through game format to discuss heart disease and the return to sexual intimacy. Provides group verbal and written material to discuss and apply goal setting through the application of the S.M.A.R.T. Method. Flowsheet Row Cardiac Rehab from 05/26/2016 in [facility] Cardiac and Pulmonary Rehab  Date  05/12/16  Educator  SB  Instruction Review Code  2- meets goals/outcomes      Other Matters of the Heart: - Provides group verbal, written materials and models to describe Heart Failure, Angina, Valve Disease, and Diabetes in the realm of heart disease. Includes description of the disease process and treatment options available to the cardiac patient. Flowsheet Row Cardiac Rehab from 05/26/2016  in [facility] Cardiac and Pulmonary Rehab  Date  05/05/16  Educator  SB  Instruction Review Code  2- meets goals/outcomes      Exercise & Equipment Safety: - Individual verbal instruction and demonstration of equipment use and safety with use of the equipment. Flowsheet Row Cardiac Rehab from 05/26/2016 in [facility] Cardiac and Pulmonary Rehab  Date  03/31/16  Educator  sb  Instruction Review Code  2- meets goals/outcomes      Infection Prevention: - Provides verbal and written material to individual with discussion of infection control including proper hand washing and proper equipment cleaning during exercise session. Flowsheet Row Cardiac Rehab from 05/26/2016 in [facility] Cardiac and Pulmonary Rehab  Date  03/31/16  Educator  sb  Instruction Review Code  2- meets goals/outcomes      Falls Prevention: - Provides verbal and written material to individual with discussion of falls prevention and safety.   Diabetes: - Individual verbal and written instruction to review signs/symptoms of diabetes, desired ranges of glucose level fasting, after meals and with exercise. Advice that pre and post exercise glucose checks will be done for 3 sessions at entry of program. [place] from 05/26/2016 in [facility] Cardiac and Pulmonary Rehab  Date  03/31/16  Educator  sb  Instruction Review Code  2- meets goals/outcomes       Knowledge Questionnaire Score:   Core Components/Risk Factors/Patient Goals at Admission:     Personal Goals and Risk Factors at Admission - 03/31/16 1652      Core Components/Risk Factors/Patient Goals on Admission   Increase Strength and Stamina Yes   Intervention Provide advice, education, support and counseling about physical activity/exercise needs.;Develop an individualized exercise prescription for aerobic and resistive training based on initial evaluation findings, risk stratification, comorbidities and participant's personal goals.   Expected Outcomes  Achievement of increased cardiorespiratory fitness and enhanced flexibility, muscular endurance and strength shown through measurements of functional capacity and personal statement of participant.   Diabetes Yes   Intervention Provide education about signs/symptoms and action to take for hypo/hyperglycemia.;Provide education about proper nutrition, including hydration, and aerobic/resistive exercise prescription along with prescribed medications to achieve blood glucose in normal ranges: Fasting glucose 65-99 mg/dL   Expected Outcomes Short Term: Participant verbalizes understanding of the signs/symptoms and immediate care of hyper/hypoglycemia, proper foot care and importance of medication, aerobic/resistive exercise and nutrition plan for blood glucose control.;Long Term: Attainment of HbA1C < 7%.   Hypertension Yes   Intervention Provide education on lifestyle modifcations including regular physical activity/exercise, weight management, moderate sodium restriction and increased consumption of fresh fruit, vegetables, and low fat dairy, alcohol moderation, and smoking cessation.;Monitor prescription use compliance.   Expected Outcomes Short Term: Continued assessment and intervention until BP is < 140/67m HG in hypertensive participants. < 130/876mHG in hypertensive participants with diabetes, heart failure or chronic kidney disease.;Long Term: Maintenance of blood pressure at goal levels.   Lipids Yes   Intervention Provide education and support for participant on nutrition & aerobic/resistive exercise along with prescribed medications to achieve LDL '70mg'$ , HDL >'40mg'$ .   Expected Outcomes Short Term: Participant states understanding of desired cholesterol values and is compliant with medications prescribed. Participant is following exercise prescription and nutrition guidelines.;Long Term: Cholesterol controlled with medications as prescribed, with individualized exercise RX and with personalized  nutrition plan. Value goals: LDL < '70mg'$ , HDL > 40 mg.   Stress Yes   Intervention Offer individual and/or small group education and counseling on adjustment to heart disease, stress management and health-related lifestyle change. Teach and support self-help strategies.;Refer participants experiencing significant psychosocial distress to appropriate mental health specialists for further evaluation and treatment. When possible, include family members and significant others in education/counseling sessions.   Expected Outcomes Short Term: Participant demonstrates changes in health-related behavior, relaxation and other stress management skills, ability to obtain effective social support, and compliance with psychotropic medications if prescribed.;Long Term: Emotional wellbeing is indicated by absence of clinically significant psychosocial distress or social isolation.      Core Components/Risk Factors/Patient Goals Review:      Goals and Risk Factor Review    Row Name 04/14/16 0916108/02/17 [id number] 05/12/16 0923         Core Components/Risk Factors/Patient Goals Review   Personal Goals Review Weight Management/Obesity;Sedentary;Heart Failure;Diabetes;Stress;Hypertension;Lipids Lipids;Stress;Hypertension;Diabetes Diabetes;Hypertension;Lipids;Stress     Review FrLadarians off to a good start in the program.  He  feeling a little stronger and a little more stamina after a week in.  He does not check his blood pressure at home, but it has been good while here at rehab.  His blood sugars have all been under 150s.  He has had no problems with his statins.  He also said that he has no  more stress. Luigi blood sugars have all been under 200 recently.  He is not checking his blood pressure at home, but it has been good in rehab (120s resting). He has not had any recent blood work and continues to take his statin.  And he says that he does not have any stress. Lenell reports good diabetes managment. He  consistantly checks his blood sugars once a day. Blood pressures have also been in acceptable ranges and weight has been steady. He is taking his statin consistantly and has not aches or pains due to this medication. He stated that he is feeling stonger and has increased his stamina. He is not having any problems with stress levels.      Expected Outcomes Braxten will continue to come to class for education and exercise.  We will continue to monitor his blood pressure.  Since his blood sugars have been good we will stop checking while here unless symptomatic. Rondale will continue to come to exercise and education classes. We will continue to monitor his blood pressure. Shepard will continue his consistant attendance to exercise and education classes. He will continue to progress with his exercise which will aid in continued strength gains, and BP, BG, and lipid panal management.         Core Components/Risk Factors/Patient Goals at Discharge (Final Review):      Goals and Risk Factor Review - 05/12/16 0923      Core Components/Risk Factors/Patient Goals Review   Personal Goals Review Diabetes;Hypertension;Lipids;Stress   Review Maejor reports good diabetes managment. He consistantly checks his blood sugars once a day. Blood pressures have also been in acceptable ranges and weight has been steady. He is taking his statin consistantly and has not aches or pains due to this medication. He stated that he is feeling stonger and has increased his stamina. He is not having any problems with stress levels.    Expected Outcomes Zyan will continue his consistant attendance to exercise and education classes. He will continue to progress with his exercise which will aid in continued strength gains, and BP, BG, and lipid panal management.       ITP Comments:     ITP Comments    Row Name 03/31/16 1637 03/31/16 1643 03/31/16 1707 04/02/16 0837 04/07/16 0916   ITP Comments Medical review and Intitial ITP  completed. Documentation of diagnosis Documentation of diagnosis in [place]  03/04/2016 Medical review and Initial ITP completed today 30 day review. Continue with ITP. HAs completed med review and will start sessions next week After exercise was completed, Savannah complained of feeling dizzy.  He was seated and checked.  Blood pressure was 96/54.  He was given 2 cups of water to increase blood pressure to 104/60.  He was able to do weights and was dizzy again after cool down.  Blood pressure was 92/52.  Then after water 96/60, CBG 96. Pt felt better and was able to sit through education.   [place] Name 04/30/16 0744 05/28/16 0626         ITP Comments 30 day review. Continue with ITP unless changes noted by Medical Director at signature of review. 30 day review. Continue with ITP unless changes noted by Medical Director at signature of review.         Comments:

## 2016-06-02 ENCOUNTER — Encounter: Payer: Medicare Other | Admitting: *Deleted

## 2016-06-02 DIAGNOSIS — I214 Non-ST elevation (NSTEMI) myocardial infarction: Secondary | ICD-10-CM

## 2016-06-02 DIAGNOSIS — Z951 Presence of aortocoronary bypass graft: Secondary | ICD-10-CM | POA: Diagnosis not present

## 2016-06-02 NOTE — Progress Notes (Signed)
Daily Session Note  Patient Details  Name: Tyler Russell MRN: [id number] Date of Birth: June 09, 1956 Referring Provider:   Flowsheet Row Cardiac Rehab from 03/31/2016 in [facility] Cardiac and Pulmonary Rehab  Referring Provider  Bartle      Encounter Date: 06/02/2016  Check In:     Session Check In - 06/02/16 0846      Check-In   Location [facility]-Cardiac & Pulmonary Rehab   Staff Present Gerlene Burdock, RN, Moises Blood, BS, ACSM CEP, Exercise Physiologist;Jessica Luan [place]ing, [place], ACSM RCEP, Exercise Physiologist   Supervising physician immediately available to respond to emergencies See telemetry face sheet for immediately available ER MD   Medication changes reported     No   Fall or balance concerns reported    No   Warm-up and Cool-down Performed on first and last piece of equipment   Resistance Training Performed Yes   VAD Patient? No     Pain Assessment   Currently in Pain? No/denies   Multiple Pain Sites No         Goals Met:  Independence with exercise equipment Personal goals reviewed No report of cardiac concerns or symptoms Strength training completed today  Goals Unmet:  Not Applicable  Comments: Pt able to follow exercise prescription today without complaint.  Will continue to monitor for progression.    Dr. Emily Filbert is Medical Director for [place] and [facility].

## 2016-06-04 ENCOUNTER — Encounter: Payer: Medicare Other | Admitting: *Deleted

## 2016-06-04 DIAGNOSIS — I214 Non-ST elevation (NSTEMI) myocardial infarction: Secondary | ICD-10-CM | POA: Diagnosis not present

## 2016-06-04 DIAGNOSIS — Z951 Presence of aortocoronary bypass graft: Secondary | ICD-10-CM

## 2016-06-04 NOTE — Progress Notes (Signed)
Daily Session Note  Patient Details  Name: Tyler Russell MRN: [id number] Date of Birth: Sep 08, 1956 Referring Provider:   Flowsheet Row Cardiac Rehab from 03/31/2016 in [facility] Cardiac and Pulmonary Rehab  Referring Provider  Bartle      Encounter Date: 06/04/2016  Check In:     Session Check In - 06/04/16 0837      Check-In   Location [facility]-Cardiac & Pulmonary Rehab   Staff Present Alberteen Sam, MA, ACSM RCEP, Exercise Physiologist;Amanda Oletta Darter, BA, ACSM CEP, Exercise Physiologist;Carroll Enterkin, RN, BSN   Supervising physician immediately available to respond to emergencies See telemetry face sheet for immediately available ER MD   Medication changes reported     No   Fall or balance concerns reported    No   Warm-up and Cool-down Performed on first and last piece of equipment   Resistance Training Performed Yes   VAD Patient? No     Pain Assessment   Currently in Pain? No/denies   Multiple Pain Sites No         Goals Met:  Independence with exercise equipment Exercise tolerated well No report of cardiac concerns or symptoms Strength training completed today  Goals Unmet:  Not Applicable  Comments: Pt able to follow exercise prescription today without complaint.  Will continue to monitor for progression.    Dr. Emily Filbert is Medical Director for [place] and [facility].

## 2016-06-06 ENCOUNTER — Encounter: Payer: Medicare Other | Admitting: *Deleted

## 2016-06-06 DIAGNOSIS — I214 Non-ST elevation (NSTEMI) myocardial infarction: Secondary | ICD-10-CM | POA: Diagnosis not present

## 2016-06-06 DIAGNOSIS — Z951 Presence of aortocoronary bypass graft: Secondary | ICD-10-CM | POA: Diagnosis not present

## 2016-06-06 NOTE — Progress Notes (Signed)
Daily Session Note  Patient Details  Name: Tyler Russell MRN: [id number] Date of Birth: Feb 10, 1956 Referring Provider:   Flowsheet Row Cardiac Rehab from 03/31/2016 in [facility] Cardiac and Pulmonary Rehab  Referring Provider  Bartle      Encounter Date: 06/06/2016  Check In:     Session Check In - 06/06/16 0824      Check-In   Location [facility]-Cardiac & Pulmonary Rehab   Staff Present Gerlene Burdock, RN, Levie Heritage, MA, ACSM RCEP, Exercise Physiologist   Supervising physician immediately available to respond to emergencies See telemetry face sheet for immediately available ER MD   Medication changes reported     No   Warm-up and Cool-down Performed on first and last piece of equipment   Resistance Training Performed Yes   VAD Patient? No     Pain Assessment   Currently in Pain? No/denies         Goals Met:  Proper associated with RPD/PD & O2 Sat Exercise tolerated well  Goals Unmet:  Not Applicable  Comments:    Dr. Emily Filbert is Medical Director for [place] and [facility].

## 2016-06-11 ENCOUNTER — Encounter: Payer: Medicare Other | Admitting: *Deleted

## 2016-06-11 VITALS — BP 88/60 | HR 95 | Ht 66.5 in | Wt 155.0 lb

## 2016-06-11 DIAGNOSIS — I214 Non-ST elevation (NSTEMI) myocardial infarction: Secondary | ICD-10-CM

## 2016-06-11 DIAGNOSIS — Z951 Presence of aortocoronary bypass graft: Secondary | ICD-10-CM

## 2016-06-11 NOTE — Progress Notes (Signed)
Daily Session Note  Patient Details  Name: Tyler Russell MRN: [id number] Date of Birth: 11/23/1955 Referring Provider:   Flowsheet Row Cardiac Rehab from 03/31/2016 in [facility] Cardiac and Pulmonary Rehab  Referring Provider  Bartle      Encounter Date: 06/11/2016  Check In:     Session Check In - 06/11/16 0847      Check-In   Location [facility]-Cardiac & Pulmonary Rehab   Staff Present Carroll Enterkin, RN, BSN;Amanda Sommer, BA, ACSM CEP, Exercise Physiologist;Laureen Brown, BS, RRT, Respiratory Therapist   Supervising physician immediately available to respond to emergencies See telemetry face sheet for immediately available ER MD   Medication changes reported     No   Fall or balance concerns reported    No   Warm-up and Cool-down Performed on first and last piece of equipment   Resistance Training Performed Yes   VAD Patient? No     Pain Assessment   Currently in Pain? No/denies         Goals Met:  Proper associated with RPD/PD & O2 Sat Exercise tolerated well  Goals Unmet:  Not Applicable  Comments:     Dr. Mark Miller is Medical Director for [facility] and [facility]. 

## 2016-06-12 DIAGNOSIS — E1165 Type 2 diabetes mellitus with hyperglycemia: Secondary | ICD-10-CM | POA: Diagnosis not present

## 2016-06-12 DIAGNOSIS — Z23 Encounter for immunization: Secondary | ICD-10-CM | POA: Diagnosis not present

## 2016-06-12 DIAGNOSIS — I2510 Atherosclerotic heart disease of native coronary artery without angina pectoris: Secondary | ICD-10-CM | POA: Diagnosis not present

## 2016-06-12 DIAGNOSIS — R0602 Shortness of breath: Secondary | ICD-10-CM | POA: Diagnosis not present

## 2016-06-12 DIAGNOSIS — R55 Syncope and collapse: Secondary | ICD-10-CM | POA: Diagnosis not present

## 2016-06-12 DIAGNOSIS — E782 Mixed hyperlipidemia: Secondary | ICD-10-CM | POA: Diagnosis not present

## 2016-06-13 DIAGNOSIS — R55 Syncope and collapse: Secondary | ICD-10-CM | POA: Diagnosis not present

## 2016-06-13 DIAGNOSIS — I2510 Atherosclerotic heart disease of native coronary artery without angina pectoris: Secondary | ICD-10-CM | POA: Diagnosis not present

## 2016-06-13 DIAGNOSIS — I10 Essential (primary) hypertension: Secondary | ICD-10-CM | POA: Diagnosis not present

## 2016-06-13 DIAGNOSIS — R0602 Shortness of breath: Secondary | ICD-10-CM | POA: Diagnosis not present

## 2016-06-13 DIAGNOSIS — E782 Mixed hyperlipidemia: Secondary | ICD-10-CM | POA: Diagnosis not present

## 2016-06-16 ENCOUNTER — Encounter: Payer: Medicare Other | Attending: Surgery | Admitting: *Deleted

## 2016-06-16 DIAGNOSIS — Z951 Presence of aortocoronary bypass graft: Secondary | ICD-10-CM | POA: Diagnosis not present

## 2016-06-16 DIAGNOSIS — I214 Non-ST elevation (NSTEMI) myocardial infarction: Secondary | ICD-10-CM

## 2016-06-16 NOTE — Progress Notes (Signed)
Daily Session Note  Patient Details  Name: Tyler Russell MRN: [id number] Date of Birth: [birth year] Referring Provider:   Flowsheet Row Cardiac Rehab from 03/31/2016 in [facility] Cardiac and Pulmonary Rehab  Referring Provider  Bartle      Encounter Date: 06/16/2016  Check In:     Session Check In - 06/16/16 0935      Check-In   Location [facility]-Cardiac & Pulmonary Rehab   Staff Present Heath Lark, RN, BSN, Laveda Norman, BS, ACSM CEP, Exercise Physiologist;Jessica [place], [place], ACSM RCEP, Exercise Physiologist   Supervising physician immediately available to respond to emergencies See telemetry face sheet for immediately available ER MD   Medication changes reported     No   Fall or balance concerns reported    No   Warm-up and Cool-down Performed on first and last piece of equipment   Resistance Training Performed Yes   VAD Patient? No     Pain Assessment   Currently in Pain? No/denies   Multiple Pain Sites No         Goals Met:  Independence with exercise equipment Exercise tolerated well No report of cardiac concerns or symptoms Strength training completed today  Goals Unmet:  Not Applicable  Comments: Pt able to follow exercise prescription today without complaint.  Will continue to monitor for progression.    Dr. Emily Filbert is Medical Director for [place] and [facility].

## 2016-06-18 DIAGNOSIS — R0602 Shortness of breath: Secondary | ICD-10-CM | POA: Diagnosis not present

## 2016-06-20 ENCOUNTER — Encounter: Payer: Medicare Other | Admitting: *Deleted

## 2016-06-20 DIAGNOSIS — Z951 Presence of aortocoronary bypass graft: Secondary | ICD-10-CM

## 2016-06-20 DIAGNOSIS — I214 Non-ST elevation (NSTEMI) myocardial infarction: Secondary | ICD-10-CM

## 2016-06-20 NOTE — Progress Notes (Signed)
Daily Session Note  Patient Details  Name: Tyler Russell MRN: [id number] Date of Birth: February 21, 1956 Referring Provider:   Flowsheet Row Cardiac Rehab from 03/31/2016 in [facility] Cardiac and Pulmonary Rehab  Referring Provider  Bartle      Encounter Date: 06/20/2016  Check In:     Session Check In - 06/20/16 0919      Check-In   Location [facility]-Cardiac & Pulmonary Rehab   Staff Present Gerlene Burdock, RN, Levie Heritage, MA, ACSM RCEP, Exercise Physiologist;Laureen Janell Quiet, RRT, Respiratory Therapist   Supervising physician immediately available to respond to emergencies See telemetry face sheet for immediately available ER MD   Medication changes reported     No   Fall or balance concerns reported    No   Warm-up and Cool-down Performed on first and last piece of equipment   Resistance Training Performed Yes   VAD Patient? No     Pain Assessment   Currently in Pain? No/denies   Multiple Pain Sites No         Goals Met:  Independence with exercise equipment Exercise tolerated well No report of cardiac concerns or symptoms Strength training completed today  Goals Unmet:  Not Applicable  Comments: Pt able to follow exercise prescription today without complaint.  Will continue to monitor for progression.    Dr. Emily Filbert is Medical Director for [place] and [facility].

## 2016-06-25 ENCOUNTER — Encounter: Payer: Medicare Other | Admitting: *Deleted

## 2016-06-25 ENCOUNTER — Encounter: Payer: Self-pay | Admitting: *Deleted

## 2016-06-25 DIAGNOSIS — Z951 Presence of aortocoronary bypass graft: Secondary | ICD-10-CM

## 2016-06-25 DIAGNOSIS — I214 Non-ST elevation (NSTEMI) myocardial infarction: Secondary | ICD-10-CM

## 2016-06-25 NOTE — Progress Notes (Signed)
Cardiac Individual Treatment Plan  Patient Details  Name: Tyler Russell MRN: [id number] Date of Birth: 12-15-1955 Referring Provider:   Flowsheet Row Cardiac Rehab from 03/31/2016 in [facility] Cardiac and Pulmonary Rehab  Referring Provider  Bartle      Initial Encounter Date:  Flowsheet Row Cardiac Rehab from 03/31/2016 in [facility] Cardiac and Pulmonary Rehab  Date  03/31/16  Referring Provider  Bartle      Visit Diagnosis: NSTEMI (non-ST elevated myocardial infarction) ([place])  S/P CABG x 3  Patient's Home Medications on Admission:  Current Outpatient Prescriptions:  .  aspirin 81 MG tablet, Take 81 mg by mouth daily., Disp: , Rfl:  .  aspirin EC 325 MG EC tablet, Take 1 tablet (325 mg total) by mouth daily. (Patient not taking: Reported on 03/31/2016), Disp: 30 tablet, Rfl: 0 .  atorvastatin (LIPITOR) 40 MG tablet, Take 1 tablet (40 mg total) by mouth daily at 6 PM. (Patient not taking: Reported on 03/31/2016), Disp: 30 tablet, Rfl: 3 .  blood glucose meter kit and supplies, Dispense based on patient and insurance preference. Use up to four times daily as directed. (FOR ICD-9 250.00, 250.01)., Disp: 1 each, Rfl: 0 .  gemfibrozil (LOPID) 600 MG tablet, Take 600 mg by mouth 2 (two) times daily before a meal., Disp: , Rfl:  .  lisinopril (ZESTRIL) 2.5 MG tablet, Take 1 tablet (2.5 mg total) by mouth daily., Disp: 30 tablet, Rfl: 3 .  metFORMIN (GLUCOPHAGE) 850 MG tablet, Take 1 tablet (850 mg total) by mouth 2 (two) times daily with a meal. (Patient not taking: Reported on 04/02/2016), Disp: 60 tablet, Rfl: 3 .  metoprolol (LOPRESSOR) 50 MG tablet, Take 1 tablet (50 mg total) by mouth 2 (two) times daily., Disp: 60 tablet, Rfl: 3 .  omeprazole (PRILOSEC) 20 MG capsule, Take 20 mg by mouth daily. , Disp: , Rfl:  .  traMADol (ULTRAM) 50 MG tablet, Take 1-2 tablets (50-100 mg total) by mouth every 4 (four) hours as needed for moderate pain. (Patient not taking: Reported on 04/02/2016), Disp: 30  tablet, Rfl: 0  Past Medical History: Past Medical History:  Diagnosis Date  . Alcohol abuse   . Arthritis   . Chronic back pain   . Chronic kidney disease   . Complication of anesthesia    had difficulty with breathing, had difficulty waking up  . Gonorrhea   . Headache(784.0)   . Hypercholesteremia    no medication at this time  . Hypertension   . Trigger finger    on both hands  . Ulcer ([place])    on medication    Tobacco Use: History  Smoking Status  . Former Smoker  . Types: Cigarettes  Smokeless Tobacco  . Not on file    Comment: stopped at age 38 years    Labs: Recent Review Flowsheet Data    Labs for ITP Cardiac and Pulmonary Rehab Latest Ref Rng & Units 02/29/2016 02/29/2016 02/29/2016 02/29/2016 03/01/2016   Cholestrol 0 - 200 mg/dL - - - - -   LDLCALC 0 - 99 mg/dL - - - - -   HDL >40 mg/dL - - - - -   Trlycerides <150 mg/dL - - - - -   Hemoglobin A1c 4.8 - 5.6 % - - - - -   PHART 7.350 - 7.450 7.424 7.312(L) 7.328(L) - -   PCO2ART 35.0 - 45.0 mmHg 34.2(L) 40.7 37.8 - -   HCO3 20.0 - 24.0 mEq/L 22.7 20.8 19.9(L) - -  TCO2 0 - 100 mmol/L _0 ACIDBASEDEF 0.0 - 2.0 mmol/L 2.0 5.0(H) 6.0(H) - -   O2SAT % 97.0 98.0 98.0 - -       Exercise Target Goals:    Exercise Program Goal: Individual exercise prescription set with THRR, safety & activity barriers. Participant demonstrates ability to understand and report RPE using BORG scale, to self-measure pulse accurately, and to acknowledge the importance of the exercise prescription.  Exercise Prescription Goal: Starting with aerobic activity 30 plus minutes a day, 3 days per week for initial exercise prescription. Provide home exercise prescription and guidelines that participant acknowledges understanding prior to discharge.  Activity Barriers & Risk Stratification:     Activity Barriers & Cardiac Risk Stratification - 03/31/16 1659      Activity Barriers & Cardiac Risk Stratification   Activity  Barriers Arthritis;Shortness of Breath;Joint Problems;Deconditioning  arthritis /joints- back, elbows,shoulders. SOB"I think is more sinus since my surgery"   Cardiac Risk Stratification High      6 Minute Walk:     6 Minute Walk    Row Name 03/31/16 1511 06/11/16 0923       6 Minute Walk   Phase  - Discharge    Distance 1422 feet 1688 feet    Distance % Change  - 18 %    Walk Time 6 minutes 6 minutes    # of Rest Breaks  - 0    MPH 2.7 3.2    METS 4 4.3    RPE 11 11    VO2 Peak 14.04 15.2    Symptoms No No    Resting HR 78 bpm 95 bpm    Resting BP  - 94/60    Max Ex. HR 112 bpm 109 bpm    Max Ex. BP 132/64 122/64       Initial Exercise Prescription:     Initial Exercise Prescription - 03/31/16 1500      Date of Initial Exercise RX and Referring Provider   Date 03/31/16   Referring Provider Bartle     Treadmill   MPH 2.5   Grade 2.5   Minutes 15   METs 3.78     Recumbant Bike   Level 3   RPM 60   Watts 40   Minutes 15   METs 3.8     NuStep   Level 3   Watts 60   Minutes 15   METs 3.8     Elliptical   Level 1   Speed 50   Minutes 5     REL-XR   Level 3   Watts 60   Minutes 15   METs 3.8     T5 Nustep   Level 2   Watts 60   Minutes 15   METs 3.8     Prescription Details   Frequency (times per week) 3   Duration Progress to 30 minutes of continuous aerobic without signs/symptoms of physical distress     Intensity   THRR 40-80% of Max Heartrate 111-144   Ratings of Perceived Exertion 11-13     Progression   Progression Continue to progress workloads to maintain intensity without signs/symptoms of physical distress.     Resistance Training   Training Prescription Yes   Weight 3      Perform Capillary Blood Glucose checks as needed.  Exercise Prescription Changes:     Exercise Prescription Changes    Row Name 03/31/16 1230 04/08/16 1300 04/22/16 1500  04/30/16 0800 05/08/16 1100     Exercise Review   Progression  - No   First full day of exercise Yes  - Yes     Response to Exercise   Blood Pressure (Admit)  - 122/66 112/64  - 114/60   Blood Pressure (Exercise) 132/64 136/64 136/70  - 128/64   Blood Pressure (Exit) 114/70 92/52 94/50  - 104/62   Heart Rate (Admit) 67 bpm 71 bpm 78 bpm  - 73 bpm   Heart Rate (Exercise) 112 bpm 124 bpm 123 bpm  - 109 bpm   Heart Rate (Exit) 73 bpm 70 bpm 77 bpm  - 83 bpm   Rating of Perceived Exertion (Exercise) _0 - 10   Symptoms  - Dizziness after elliptical and after weights  -  -  -   Comments  -  -  -  - Home Exercise Guidelines given 04/30/16   Duration  - Progress to 45 minutes of aerobic exercise without signs/symptoms of physical distress Progress to 45 minutes of aerobic exercise without signs/symptoms of physical distress  - Progress to 45 minutes of aerobic exercise without signs/symptoms of physical distress   Intensity  - THRR unchanged THRR unchanged  - THRR unchanged     Progression   Progression  - Continue to progress workloads to maintain intensity without signs/symptoms of physical distress. Continue to progress workloads to maintain intensity without signs/symptoms of physical distress.  - Continue to progress workloads to maintain intensity without signs/symptoms of physical distress.   Average METs  - 4.35 3.96  - 4.64     Resistance Training   Training Prescription  - Yes Yes  - Yes   Weight  - 3 3 lbs  - 5 lbs   Reps  - 10-12 10-15  - 10-15     Interval Training   Interval Training  - No No  - No     Treadmill   MPH  -  - 2.5  - 3   Grade  -  - 2.5  - 3   Minutes  -  - 15  - 15   METs  -  - 3.78  - 4.54     Elliptical   Level  - 1 1  - 1   Speed  - 3 4.3  - 4.3   Minutes  - 15 15  - 15     T5 Nustep   Level  - 2 2  - 4   Minutes  - 15 15  - 15   METs  - 2.2 2.1  - 2.5     Home Exercise Plan   Plans to continue exercise at  -  -  - Home Home  walking and total gym   Frequency  -  -  - Add 2 additional days to program  exercise sessions. Add 2 additional days to program exercise sessions.   [place] Name 05/21/16 1500 06/04/16 1500 06/17/16 1400         Exercise Review   Progression Yes Yes Yes       Response to Exercise   Blood Pressure (Admit) 90/58  rck 104/54 after water 104/64 122/70     Blood Pressure (Exercise) 142/64 130/58 170/80     Blood Pressure (Exit) 120/70 104/64 110/62     Heart Rate (Admit) 77 bpm 89 bpm 81 bpm     Heart Rate (Exercise) 126 bpm 110 bpm 143 bpm  Heart Rate (Exit) 89 bpm 78 bpm 103 bpm     Rating of Perceived Exertion (Exercise) _0 Symptoms none none none     Comments Home Exercise Guidelines given 04/30/16 Home Exercise Guidelines given 04/30/16 Home Exercise Guidelines given 04/30/16     Duration Progress to 45 minutes of aerobic exercise without signs/symptoms of physical distress Progress to 45 minutes of aerobic exercise without signs/symptoms of physical distress Progress to 45 minutes of aerobic exercise without signs/symptoms of physical distress     Intensity THRR unchanged THRR unchanged THRR unchanged       Progression   Progression Continue to progress workloads to maintain intensity without signs/symptoms of physical distress. Continue to progress workloads to maintain intensity without signs/symptoms of physical distress. Continue to progress workloads to maintain intensity without signs/symptoms of physical distress.     Average METs 5.52 4.68 4.68       Resistance Training   Training Prescription Yes Yes Yes     Weight 7 lbs 7 lbs 7 lbs     Reps 10-15 10-15 10-15       Interval Training   Interval Training No No No       Treadmill   MPH _1 Grade _2 Minutes _3 METs 4.54 4.54 4.54       Elliptical   Level _4 Speed 4.3 4.3 4.3     Minutes _5 T5 Nustep   Level _6 Minutes _7 METs  - 3 3.4       Home Exercise Plan   Plans to continue exercise at Home  walking and total gym  Home  walking and total gym Home  walking and total gym     Frequency Add 2 additional days to program exercise sessions. Add 2 additional days to program exercise sessions. Add 2 additional days to program exercise sessions.        Exercise Comments:     Exercise Comments    Row Name 03/31/16 1522 04/07/16 0916 04/11/16 1016 04/22/16 1525 04/30/16 0836   Exercise Comments Mr. Jedlicka has no injuries that affect exercise. First full day of exercise!  Patient was oriented to gym and equipment including functions, settings, policies, and procedures.  Patient's individual exercise prescription and treatment plan were reviewed.  All starting workloads were established based on the results of the 6 minute walk test done at initial orientation visit.  The plan for exercise progression was also introduced and progression will be customized based on patient's performance and goals. Reviewed METs average and discussed progression with pt today. Kipling is off to a good start with exercise.  He is already doing 4.3 mph on the elliptical.  We will continue to monitor for progression. Home exercise was reviewed with Clancey today.   Row Name 04/30/16 [phone] 05/08/16 1139 05/21/16 0910 05/21/16 1542 05/26/16 0843   Exercise Comments  Reviewed home exercise with pt today.  Pt plans to walk and use total gym for exercise.  Reviewed THR, pulse, RPE, sign and symptoms, NTG use, and when to call 911 or MD.  Also discussed weather considerations and indoor options.  Pt voiced understanding. Olon is doing well with exercise in rehab.  He is already up to  level 4 on the T5 NuStep.  We will continue to monitor for progression. Fount's blood pressure was low checking in, 90/50.  We gave him some water and blood pressure came up to 102. Mivaan continues to do well with exercise.  He is walking at home and using his total gym on his off days from rehab.  We will continue to monitor his progression. Reviewed METs average and  discussed progression with pt today.   [place] Name 06/04/16 1532 06/17/16 1421         Exercise Comments Miklo continues to do well with exercise. He is working hard on the equipment.  We will continue to monitor for progression. Merrill is nearing graduation already!  He had an 18% increase in his walk test distance!  We will continue to monitor until graduation!         Discharge Exercise Prescription (Final Exercise Prescription Changes):     Exercise Prescription Changes - 06/17/16 1400      Exercise Review   Progression Yes     Response to Exercise   Blood Pressure (Admit) 122/70   Blood Pressure (Exercise) 170/80   Blood Pressure (Exit) 110/62   Heart Rate (Admit) 81 bpm   Heart Rate (Exercise) 143 bpm   Heart Rate (Exit) 103 bpm   Rating of Perceived Exertion (Exercise) 12   Symptoms none   Comments Home Exercise Guidelines given 04/30/16   Duration Progress to 45 minutes of aerobic exercise without signs/symptoms of physical distress   Intensity THRR unchanged     Progression   Progression Continue to progress workloads to maintain intensity without signs/symptoms of physical distress.   Average METs 4.68     Resistance Training   Training Prescription Yes   Weight 7 lbs   Reps 10-15     Interval Training   Interval Training No     Treadmill   MPH 3   Grade 3   Minutes 15   METs 4.54     Elliptical   Level 6   Speed 4.3   Minutes 15     T5 Nustep   Level 5   Minutes 15   METs 3.4     Home Exercise Plan   Plans to continue exercise at Home  walking and total gym   Frequency Add 2 additional days to program exercise sessions.      Nutrition:  Target Goals: Understanding of nutrition guidelines, daily intake of sodium <1566m, cholesterol <2042m calories 30% from fat and 7% or less from saturated fats, daily to have 5 or more servings of fruits and vegetables.  Biometrics:     Pre Biometrics - 03/31/16 1521      Pre Biometrics   Height 5'  6.5" (1.689 m)   Weight 152 lb (68.9 kg)   Waist Circumference 35.25 inches   Hip Circumference 38.5 inches   Waist to Hip Ratio 0.92 %   BMI (Calculated) 24.2   Single Leg Stand 30 seconds         Post Biometrics - 06/11/16 [id number]     Post  Biometrics   Height 5' 6.5" (1.689 m)   Weight 155 lb (70.3 kg)   Waist Circumference 35 inches   Hip Circumference 38 inches   Waist to Hip Ratio 0.92 %   BMI (Calculated) 24.7   Single Leg Stand 30 seconds      Nutrition Therapy Plan and Nutrition Goals:     Nutrition Therapy & Goals -  03/31/16 1652      Intervention Plan   Intervention Prescribe, educate and counsel regarding individualized specific dietary modifications aiming towards targeted core components such as weight, hypertension, lipid management, diabetes, heart failure and other comorbidities.   Expected Outcomes Short Term Goal: Understand basic principles of dietary content, such as calories, fat, sodium, cholesterol and nutrients.;Short Term Goal: A plan has been developed with personal nutrition goals set during dietitian appointment.;Long Term Goal: Adherence to prescribed nutrition plan.      Nutrition Discharge: Rate Your Plate Scores:     Nutrition Assessments - 06/17/16 1423      Rate Your Plate Scores   Pre Score 71   Pre Score % 78.8 %   Post Score 74   Post Score % 82 %   % Change 3.2 %      Nutrition Goals Re-Evaluation:   Psychosocial: Target Goals: Acknowledge presence or absence of depression, maximize coping skills, provide positive support system. Participant is able to verbalize types and ability to use techniques and skills needed for reducing stress and depression.  Initial Review & Psychosocial Screening:     Initial Psych Review & Screening - 03/31/16 1654      Initial Review   Current issues with Current Stress Concerns  Concerned about daughter with problems that are stressful   Comments Family conerns is causing stress      Family Dynamics   Good Support System? Yes  Wife, family and church     Barriers   Psychosocial barriers to participate in program There are no identifiable barriers or psychosocial needs.;The patient should benefit from training in stress management and relaxation.     Screening Interventions   Interventions Encouraged to exercise      Quality of Life Scores:     Quality of Life - 06/17/16 1423      Quality of Life Scores   Health/Function Pre 22.57 %   Health/Function Post 25.6 %   Health/Function % Change 13.42 %   Socioeconomic Pre 22 %   Socioeconomic Post 23.63 %   Socioeconomic % Change  7.41 %   Psych/Spiritual Pre 29.64 %   Psych/Spiritual Post 28.07 %   Psych/Spiritual % Change -5.3 %   Family Pre 13.2 %   Family Post 21.8 %   Family % Change 65.15 %   GLOBAL Pre 22.53 %   GLOBAL Post 25.1 %   GLOBAL % Change 11.41 %      PHQ-9: Recent Review Flowsheet Data    Depression screen [facility] 2/9 06/17/2016 03/31/2016   Decreased Interest 0 0   Down, Depressed, Hopeless 0 0   PHQ - 2 Score 0 0   Altered sleeping 0 0   Tired, decreased energy 0 3   Change in appetite 0 0   Feeling bad or failure about yourself  0 0   Trouble concentrating 0 3   Moving slowly or fidgety/restless 0 0   Suicidal thoughts 0 0   PHQ-9 Score 0 6   Difficult doing work/chores Not difficult at all Somewhat difficult      Psychosocial Evaluation and Intervention:     Psychosocial Evaluation - 04/07/16 0947      Psychosocial Evaluation & Interventions   Interventions Relaxation education;Encouraged to exercise with the program and follow exercise prescription   Comments Counselor met with Mr. Frieden for initial psychosocial evaluation.  He is a 60 year old who had open heart surgery with (7) blockages last month.  He  has a strong support system with a spouse of 74 years and (2) adult daughters who live close by.  He is also actively a part of his local church community.  Mr. Terrien states  he sleeps well and has a good appetite.  He denies a history of depression or anxiety or any current symptoms.  He states he is typically in a positive mood although he has several stressors; with a daughter who is dealing with addictions and Mr. Lurline Del own finances due to medical bills and being on disability.  Mr. Leung has goals to get healthier and increase his energy levels.  He has been walking 1.5 miles every day and has a Total Gym at home that he plans to use to maintain consistency in exercising following this program.  Counselor encouraged Mr. Baiz to get his eyes checked soon since he complains of having difficulty reading or watching TV.  Counselor will continue to follow with Mr. Shirk throughout the course of this program.       Psychosocial Re-Evaluation:     Psychosocial Re-Evaluation    [place] Name 04/23/16 [phone] 05/21/16 0945           Psychosocial Re-Evaluation   Comments Counselor follow up today with Mr. Slager reporting feeling better and stronger since coming into this program.  He reports the stress with his daughter has decreased and other than some dizziness lately he is doing much better.   Mr. Lowrey is supposed to see his Dr. next week re: his blood pressure medication which he thinks is contributing to the dizziness.  He will report back after this appointment.  He also pointed out a tender spot on his neck/upper back that has been there for awhile and is more pronounced lately.  He will check with his doctor on this as well.   Follow up with Mr. Rominger today.  He reports great progress since beginning this program with keeping his weight down and being able to get off the medications for borderline diabetes.  He states he feels stronger and a family member mentioned he "looks younger" since starting this program.  Mr. Loletha Grayer states his mood remains positive and he is able to do some part-time work lately as well that helps with financial stress and medical bills.  The situation with  his daughter still remains a problem, but Mr. Loletha Grayer reports he is allowing his wife to deal with it and that helps the relationship all around.  Mr. Loletha Grayer celebrated 24 years of marriage yesterday and stated he is feeling better about being here for more years now.  Counselor commended Mr. Wedemeyer on all his hard work and progress made.           Vocational Rehabilitation: Provide vocational rehab assistance to qualifying candidates.   Vocational Rehab Evaluation & Intervention:     Vocational Rehab - 03/31/16 1702      Initial Vocational Rehab Evaluation & Intervention   Assessment shows need for Vocational Rehabilitation No      Education: Education Goals: Education classes will be provided on a weekly basis, covering required topics. Participant will state understanding/return demonstration of topics presented.  Learning Barriers/Preferences:     Learning Barriers/Preferences - 03/31/16 1701      Learning Barriers/Preferences   Learning Barriers Sight;Hearing;Inability to learn new things;Reading  left school at 7 th grade, always trouble learning.  Worked ever since left school.    Learning Preferences Individual Instruction      Education Topics:  General Nutrition Guidelines/Fats and Fiber: -Group instruction provided by verbal, written material, models and posters to present the general guidelines for heart healthy nutrition. Gives an explanation and review of dietary fats and fiber. Flowsheet Row Cardiac Rehab from 06/16/2016 in [facility] Cardiac and Pulmonary Rehab  Date  06/02/16  Educator  CR  Instruction Review Code  2- meets goals/outcomes      Controlling Sodium/Reading Food Labels: -Group verbal and written material supporting the discussion of sodium use in heart healthy nutrition. Review and explanation with models, verbal and written materials for utilization of the food label. Flowsheet Row Cardiac Rehab from 06/16/2016 in [facility] Cardiac and Pulmonary Rehab  Date  04/21/16   Educator  CR  Instruction Review Code  2- meets goals/outcomes      Exercise Physiology & Risk Factors: - Group verbal and written instruction with models to review the exercise physiology of the cardiovascular system and associated critical values. Details cardiovascular disease risk factors and the goals associated with each risk factor. Flowsheet Row Cardiac Rehab from 06/16/2016 in [facility] Cardiac and Pulmonary Rehab  Date  06/16/16  Educator  [facility]  Instruction Review Code  2- meets goals/outcomes      Aerobic Exercise & Resistance Training: - Gives group verbal and written discussion on the health impact of inactivity. On the components of aerobic and resistive training programs and the benefits of this training and how to safely progress through these programs. Flowsheet Row Cardiac Rehab from 06/16/2016 in [facility] Cardiac and Pulmonary Rehab  Date  04/23/16  Educator  [facility]  Instruction Review Code  2- meets goals/outcomes      Flexibility, Balance, General Exercise Guidelines: - Provides group verbal and written instruction on the benefits of flexibility and balance training programs. Provides general exercise guidelines with specific guidelines to those with heart or lung disease. Demonstration and skill practice provided. Flowsheet Row Cardiac Rehab from 06/16/2016 in [facility] Cardiac and Pulmonary Rehab  Date  04/28/16  Educator  [facility]  Instruction Review Code  2- meets goals/outcomes      Stress Management: - Provides group verbal and written instruction about the health risks of elevated stress, cause of high stress, and healthy ways to reduce stress. Flowsheet Row Cardiac Rehab from 06/16/2016 in [facility] Cardiac and Pulmonary Rehab  Date  04/30/16  Educator  [facility]  Instruction Review Code  2- meets goals/outcomes      Depression: - Provides group verbal and written instruction on the correlation between heart/lung disease and depressed mood, treatment options, and the stigmas associated  with seeking treatment. Flowsheet Row Cardiac Rehab from 06/16/2016 in [facility] Cardiac and Pulmonary Rehab  Date  06/04/16  Educator  [facility]  Instruction Review Code  2- meets goals/outcomes      Anatomy & Physiology of the Heart: - Group verbal and written instruction and models provide basic cardiac anatomy and physiology, with the coronary electrical and arterial systems. Review of: AMI, Angina, Valve disease, Heart Failure, Cardiac Arrhythmia, Pacemakers, and the ICD. Flowsheet Row Cardiac Rehab from 06/16/2016 in [facility] Cardiac and Pulmonary Rehab  Date  05/05/16  Educator  [facility]  Instruction Review Code  2- meets goals/outcomes      Cardiac Procedures: - Group verbal and written instruction and models to describe the testing methods done to diagnose heart disease. Reviews the outcomes of the test results. Describes the treatment choices: Medical Management, Angioplasty, or Coronary Bypass Surgery. Flowsheet Row Cardiac Rehab from 06/16/2016 in [facility] Cardiac and Pulmonary Rehab  Date  05/12/16  Educator  SB  Instruction Review Code  2- meets goals/outcomes      Cardiac Medications: - Group verbal and written instruction to review commonly prescribed medications for heart disease. Reviews the medication, class of the drug, and side effects. Includes the steps to properly store meds and maintain the prescription regimen. Flowsheet Row Cardiac Rehab from 06/16/2016 in [facility] Cardiac and Pulmonary Rehab  Date  05/21/16  Educator  SB  Instruction Review Code  2- meets goals/outcomes      Go Sex-Intimacy & Heart Disease, Get SMART - Goal Setting: - Group verbal and written instruction through game format to discuss heart disease and the return to sexual intimacy. Provides group verbal and written material to discuss and apply goal setting through the application of the S.M.A.R.T. Method. Flowsheet Row Cardiac Rehab from 06/16/2016 in [facility] Cardiac and Pulmonary Rehab  Date  05/12/16  Educator  SB   Instruction Review Code  2- meets goals/outcomes      Other Matters of the Heart: - Provides group verbal, written materials and models to describe Heart Failure, Angina, Valve Disease, and Diabetes in the realm of heart disease. Includes description of the disease process and treatment options available to the cardiac patient. Flowsheet Row Cardiac Rehab from 06/16/2016 in [facility] Cardiac and Pulmonary Rehab  Date  05/05/16  Educator  SB  Instruction Review Code  2- meets goals/outcomes      Exercise & Equipment Safety: - Individual verbal instruction and demonstration of equipment use and safety with use of the equipment. Flowsheet Row Cardiac Rehab from 06/16/2016 in [facility] Cardiac and Pulmonary Rehab  Date  03/31/16  Educator  sb  Instruction Review Code  2- meets goals/outcomes      Infection Prevention: - Provides verbal and written material to individual with discussion of infection control including proper hand washing and proper equipment cleaning during exercise session. Flowsheet Row Cardiac Rehab from 06/16/2016 in [facility] Cardiac and Pulmonary Rehab  Date  03/31/16  Educator  sb  Instruction Review Code  2- meets goals/outcomes      Falls Prevention: - Provides verbal and written material to individual with discussion of falls prevention and safety.   Diabetes: - Individual verbal and written instruction to review signs/symptoms of diabetes, desired ranges of glucose level fasting, after meals and with exercise. Advice that pre and post exercise glucose checks will be done for 3 sessions at entry of program. Flowsheet Row Cardiac Rehab from 06/16/2016 in [facility] Cardiac and Pulmonary Rehab  Date  03/31/16  Educator  sb  Instruction Review Code  2- meets goals/outcomes       Knowledge Questionnaire Score:     Knowledge Questionnaire Score - 06/17/16 1423      Knowledge Questionnaire Score   Post Score 23/28      Core Components/Risk Factors/Patient Goals at  Admission:     Personal Goals and Risk Factors at Admission - 03/31/16 1652      Core Components/Risk Factors/Patient Goals on Admission   Increase Strength and Stamina Yes   Intervention Provide advice, education, support and counseling about physical activity/exercise needs.;Develop an individualized exercise prescription for aerobic and resistive training based on initial evaluation findings, risk stratification, comorbidities and participant's personal goals.   Expected Outcomes Achievement of increased cardiorespiratory fitness and enhanced flexibility, muscular endurance and strength shown through measurements of functional capacity and personal statement of participant.   Diabetes Yes   Intervention Provide education about signs/symptoms and action to take for hypo/hyperglycemia.;Provide  education about proper nutrition, including hydration, and aerobic/resistive exercise prescription along with prescribed medications to achieve blood glucose in normal ranges: Fasting glucose 65-99 mg/dL   Expected Outcomes Short Term: Participant verbalizes understanding of the signs/symptoms and immediate care of hyper/hypoglycemia, proper foot care and importance of medication, aerobic/resistive exercise and nutrition plan for blood glucose control.;Long Term: Attainment of HbA1C < 7%.   Hypertension Yes   Intervention Provide education on lifestyle modifcations including regular physical activity/exercise, weight management, moderate sodium restriction and increased consumption of fresh fruit, vegetables, and low fat dairy, alcohol moderation, and smoking cessation.;Monitor prescription use compliance.   Expected Outcomes Short Term: Continued assessment and intervention until BP is < 140/48m HG in hypertensive participants. < 130/867mHG in hypertensive participants with diabetes, heart failure or chronic kidney disease.;Long Term: Maintenance of blood pressure at goal levels.   Lipids Yes   Intervention  Provide education and support for participant on nutrition & aerobic/resistive exercise along with prescribed medications to achieve LDL <701mHDL >64m58m Expected Outcomes Short Term: Participant states understanding of desired cholesterol values and is compliant with medications prescribed. Participant is following exercise prescription and nutrition guidelines.;Long Term: Cholesterol controlled with medications as prescribed, with individualized exercise RX and with personalized nutrition plan. Value goals: LDL < 70mg24mL > 40 mg.   Stress Yes   Intervention Offer individual and/or small group education and counseling on adjustment to heart disease, stress management and health-related lifestyle change. Teach and support self-help strategies.;Refer participants experiencing significant psychosocial distress to appropriate mental health specialists for further evaluation and treatment. When possible, include family members and significant others in education/counseling sessions.   Expected Outcomes Short Term: Participant demonstrates changes in health-related behavior, relaxation and other stress management skills, ability to obtain effective social support, and compliance with psychotropic medications if prescribed.;Long Term: Emotional wellbeing is indicated by absence of clinically significant psychosocial distress or social isolation.      Core Components/Risk Factors/Patient Goals Review:      Goals and Risk Factor Review    Row Name 04/14/16 0917 04/16/16 1018 05/12/16 0923 06/02/16 1000       Core Components/Risk Factors/Patient Goals Review   Personal Goals Review Weight Management/Obesity;Sedentary;Heart Failure;Diabetes;Stress;Hypertension;Lipids Lipids;Stress;Hypertension;Diabetes Diabetes;Hypertension;Lipids;Stress Sedentary;Increase Strength and Stamina;Diabetes;Stress;Hypertension;Lipids    Review FreddSebastyanff to a good start in the program.  He  feeling a little stronger and a  little more stamina after a week in.  He does not check his blood pressure at home, but it has been good while here at rehab.  His blood sugars have all been under 150s.  He has had no problems with his statins.  He also said that he has no more stress. Renell blood sugars have all been under 200 recently.  He is not checking his blood pressure at home, but it has been good in rehab (120s resting). He has not had any recent blood work and continues to take his statin.  And he says that he does not have any stress. Andrei reports good diabetes managment. He consistantly checks his blood sugars once a day. Blood pressures have also been in acceptable ranges and weight has been steady. He is taking his statin consistantly and has not aches or pains due to this medication. He stated that he is feeling stonger and has increased his stamina. He is not having any problems with stress levels.  Dale continues ot exercise are home and here to improve his strength and stamina.  He has  been doing well with his blood pressures and blood sugars.  He has not had any stress issues and no problems taking his statin medication.    Expected Outcomes Savier will continue to come to class for education and exercise.  We will continue to monitor his blood pressure.  Since his blood sugars have been good we will stop checking while here unless symptomatic. Rea will continue to come to exercise and education classes. We will continue to monitor his blood pressure. Rawson will continue his consistant attendance to exercise and education classes. He will continue to progress with his exercise which will aid in continued strength gains, and BP, BG, and lipid panal management.  Audley will continue to attend classes and exercise at home for continued improvements.  We will continue to monitor for progression.       Core Components/Risk Factors/Patient Goals at Discharge (Final Review):      Goals and Risk Factor Review -  06/02/16 1000      Core Components/Risk Factors/Patient Goals Review   Personal Goals Review Sedentary;Increase Strength and Stamina;Diabetes;Stress;Hypertension;Lipids   Review Darryn continues ot exercise are home and here to improve his strength and stamina.  He has been doing well with his blood pressures and blood sugars.  He has not had any stress issues and no problems taking his statin medication.   Expected Outcomes Tarek will continue to attend classes and exercise at home for continued improvements.  We will continue to monitor for progression.      ITP Comments:     ITP Comments    Row Name 03/31/16 1637 03/31/16 1643 03/31/16 1707 04/02/16 0837 04/07/16 0916   ITP Comments Medical review and Intitial ITP completed. Documentation of diagnosis Documentation of diagnosis in [place]  03/04/2016 Medical review and Initial ITP completed today 30 day review. Continue with ITP. HAs completed med review and will start sessions next week After exercise was completed, Theador complained of feeling dizzy.  He was seated and checked.  Blood pressure was 96/54.  He was given 2 cups of water to increase blood pressure to 104/60.  He was able to do weights and was dizzy again after cool down.  Blood pressure was 92/52.  Then after water 96/60, CBG 96. Pt felt better and was able to sit through education.   Cucumber Name 04/30/16 [id number] 05/28/16 0626 06/11/16 0942 06/25/16 1128     ITP Comments 30 day review. Continue with ITP unless changes noted by Medical Director at signature of review. 30 day review. Continue with ITP unless changes noted by Medical Director at signature of review. Tripton's blood pressure upon arrival was 94/60 with heart rate 94. I had him drink 240cc of water. After exercise his blood pressure was 88/60 so given another 240 cc of water. Helmut said he is feeling weak today so I encourage him to drink more water. I told him I would notify his MD.  30 day review. Continue with  ITP unless changes noted by Medical Director at signature of review.       Comments:

## 2016-06-25 NOTE — Progress Notes (Signed)
Daily Session Note  Patient Details  Name: Tyler Russell MRN: [id number] Date of Birth: 1956/03/21 Referring Provider:   Flowsheet Row Cardiac Rehab from 03/31/2016 in [facility] Cardiac and Pulmonary Rehab  Referring Provider  Bartle      Encounter Date: 06/25/2016  Check In:     Session Check In - 06/25/16 0920      Check-In   Location [facility]-Cardiac & Pulmonary Rehab   Staff Present Alberteen Sam, MA, ACSM RCEP, Exercise Physiologist;Laureen Owens Shark, BS, RRT, Respiratory Therapist;Susanne Bice, RN, BSN, CCRP   Supervising physician immediately available to respond to emergencies See telemetry face sheet for immediately available ER MD   Medication changes reported     No   Fall or balance concerns reported    No   Warm-up and Cool-down Performed on first and last piece of equipment   Resistance Training Performed Yes   VAD Patient? No     Pain Assessment   Currently in Pain? No/denies   Multiple Pain Sites No         Goals Met:  Independence with exercise equipment Exercise tolerated well No report of cardiac concerns or symptoms Strength training completed today  Goals Unmet:  Not Applicable  Comments: Pt able to follow exercise prescription today without complaint.  Will continue to monitor for progression.    Dr. Emily Filbert is Medical Director for [facility] and [facility].

## 2016-06-27 ENCOUNTER — Encounter: Payer: Medicare Other | Admitting: *Deleted

## 2016-06-27 DIAGNOSIS — I214 Non-ST elevation (NSTEMI) myocardial infarction: Secondary | ICD-10-CM

## 2016-06-27 DIAGNOSIS — Z951 Presence of aortocoronary bypass graft: Secondary | ICD-10-CM | POA: Diagnosis not present

## 2016-06-27 NOTE — Progress Notes (Signed)
Daily Session Note  Patient Details  Name: Tyler Russell MRN: [id number] Date of Birth: 05/31/1956 Referring Provider:   Flowsheet Row Cardiac Rehab from 03/31/2016 in [facility] Cardiac and Pulmonary Rehab  Referring Provider  Bartle      Encounter Date: 06/27/2016  Check In:     Session Check In - 06/27/16 0909      Check-In   Location [facility]-Cardiac & Pulmonary Rehab   Staff Present Carroll Enterkin, RN, BSN;Susanne Bice, RN, BSN, CCRP;Jessica Hawkins, MA, ACSM RCEP, Exercise Physiologist   Supervising physician immediately available to respond to emergencies See telemetry face sheet for immediately available ER MD   Medication changes reported     No   Fall or balance concerns reported    No   Warm-up and Cool-down Performed on first and last piece of equipment   Resistance Training Performed Yes   VAD Patient? No     Pain Assessment   Currently in Pain? No/denies         Goals Met:  Proper associated with RPD/PD & O2 Sat Exercise tolerated well  Goals Unmet:  Not Applicable  Comments:     Dr. Mark Miller is Medical Director for [facility] and [facility]. 

## 2016-06-27 NOTE — Patient Instructions (Signed)
Discharge Instructions  Patient Details  Name: Tyler Russell MRN: [id number] Date of Birth: 02-Mar-1956 Referring Provider:  Alleen Borne, MD   Number of Visits: 31  Reason for Discharge:  Patient independent in their exercise.  Smoking History:  History  Smoking Status  . Former Smoker  . Types: Cigarettes  Smokeless Tobacco  . Not on file    Comment: stopped at age 60 years    Diagnosis:  NSTEMI (non-ST elevated myocardial infarction) (HCC)  S/P CABG x 3  Initial Exercise Prescription:     Initial Exercise Prescription - 03/31/16 1500      Date of Initial Exercise RX and Referring Provider   Date 03/31/16   Referring Provider Bartle     Treadmill   MPH 2.5   Grade 2.5   Minutes 15   METs 3.78     Recumbant Bike   Level 3   RPM 60   Watts 40   Minutes 15   METs 3.8     NuStep   Level 3   Watts 60   Minutes 15   METs 3.8     Elliptical   Level 1   Speed 50   Minutes 5     REL-XR   Level 3   Watts 60   Minutes 15   METs 3.8     T5 Nustep   Level 2   Watts 60   Minutes 15   METs 3.8     Prescription Details   Frequency (times per week) 3   Duration Progress to 30 minutes of continuous aerobic without signs/symptoms of physical distress     Intensity   THRR 40-80% of Max Heartrate 111-144   Ratings of Perceived Exertion 11-13     Progression   Progression Continue to progress workloads to maintain intensity without signs/symptoms of physical distress.     Resistance Training   Training Prescription Yes   Weight 3      Discharge Exercise Prescription (Final Exercise Prescription Changes):     Exercise Prescription Changes - 06/17/16 1400      Exercise Review   Progression Yes     Response to Exercise   Blood Pressure (Admit) 122/70   Blood Pressure (Exercise) 170/80   Blood Pressure (Exit) 110/62   Heart Rate (Admit) 81 bpm   Heart Rate (Exercise) 143 bpm   Heart Rate (Exit) 103 bpm   Rating of Perceived Exertion  (Exercise) 12   Symptoms none   Comments Home Exercise Guidelines given 04/30/16   Duration Progress to 45 minutes of aerobic exercise without signs/symptoms of physical distress   Intensity THRR unchanged     Progression   Progression Continue to progress workloads to maintain intensity without signs/symptoms of physical distress.   Average METs 4.68     Resistance Training   Training Prescription Yes   Weight 7 lbs   Reps 10-15     Interval Training   Interval Training No     Treadmill   MPH 3   Grade 3   Minutes 15   METs 4.54     Elliptical   Level 6   Speed 4.3   Minutes 15     T5 Nustep   Level 5   Minutes 15   METs 3.4     Home Exercise Plan   Plans to continue exercise at Home  walking and total gym   Frequency Add 2 additional days to program exercise sessions.  Functional Capacity:     6 Minute Walk    Row Name 03/31/16 1511 06/11/16 0923       6 Minute Walk   Phase  - Discharge    Distance 1422 feet 1688 feet    Distance % Change  - 18 %    Walk Time 6 minutes 6 minutes    # of Rest Breaks  - 0    MPH 2.7 3.2    METS 4 4.3    RPE 11 11    VO2 Peak 14.04 15.2    Symptoms No No    Resting HR 78 bpm 95 bpm    Resting BP  - 94/60    Max Ex. HR 112 bpm 109 bpm    Max Ex. BP 132/64 122/64       Quality of Life:     Quality of Life - 06/17/16 1423      Quality of Life Scores   Health/Function Pre 22.57 %   Health/Function Post 25.6 %   Health/Function % Change 13.42 %   Socioeconomic Pre 22 %   Socioeconomic Post 23.63 %   Socioeconomic % Change  7.41 %   Psych/Spiritual Pre 29.64 %   Psych/Spiritual Post 28.07 %   Psych/Spiritual % Change -5.3 %   Family Pre 13.2 %   Family Post 21.8 %   Family % Change 65.15 %   GLOBAL Pre 22.53 %   GLOBAL Post 25.1 %   GLOBAL % Change 11.41 %      Personal Goals: Goals established at orientation with interventions provided to work toward goal.     Personal Goals and Risk Factors  at Admission - 03/31/16 1652      Core Components/Risk Factors/Patient Goals on Admission   Increase Strength and Stamina Yes   Intervention Provide advice, education, support and counseling about physical activity/exercise needs.;Develop an individualized exercise prescription for aerobic and resistive training based on initial evaluation findings, risk stratification, comorbidities and participant's personal goals.   Expected Outcomes Achievement of increased cardiorespiratory fitness and enhanced flexibility, muscular endurance and strength shown through measurements of functional capacity and personal statement of participant.   Diabetes Yes   Intervention Provide education about signs/symptoms and action to take for hypo/hyperglycemia.;Provide education about proper nutrition, including hydration, and aerobic/resistive exercise prescription along with prescribed medications to achieve blood glucose in normal ranges: Fasting glucose 65-99 mg/dL   Expected Outcomes Short Term: Participant verbalizes understanding of the signs/symptoms and immediate care of hyper/hypoglycemia, proper foot care and importance of medication, aerobic/resistive exercise and nutrition plan for blood glucose control.;Long Term: Attainment of HbA1C < 7%.   Hypertension Yes   Intervention Provide education on lifestyle modifcations including regular physical activity/exercise, weight management, moderate sodium restriction and increased consumption of fresh fruit, vegetables, and low fat dairy, alcohol moderation, and smoking cessation.;Monitor prescription use compliance.   Expected Outcomes Short Term: Continued assessment and intervention until BP is < 140/6990mm HG in hypertensive participants. < 130/6880mm HG in hypertensive participants with diabetes, heart failure or chronic kidney disease.;Long Term: Maintenance of blood pressure at goal levels.   Lipids Yes   Intervention Provide education and support for participant on  nutrition & aerobic/resistive exercise along with prescribed medications to achieve LDL 70mg , HDL >40mg .   Expected Outcomes Short Term: Participant states understanding of desired cholesterol values and is compliant with medications prescribed. Participant is following exercise prescription and nutrition guidelines.;Long Term: Cholesterol controlled with medications as prescribed, with individualized exercise  RX and with personalized nutrition plan. Value goals: LDL < 70mg , HDL > 40 mg.   Stress Yes   Intervention Offer individual and/or small group education and counseling on adjustment to heart disease, stress management and health-related lifestyle change. Teach and support self-help strategies.;Refer participants experiencing significant psychosocial distress to appropriate mental health specialists for further evaluation and treatment. When possible, include family members and significant others in education/counseling sessions.   Expected Outcomes Short Term: Participant demonstrates changes in health-related behavior, relaxation and other stress management skills, ability to obtain effective social support, and compliance with psychotropic medications if prescribed.;Long Term: Emotional wellbeing is indicated by absence of clinically significant psychosocial distress or social isolation.       Personal Goals Discharge:     Goals and Risk Factor Review - 06/02/16 1000      Core Components/Risk Factors/Patient Goals Review   Personal Goals Review Sedentary;Increase Strength and Stamina;Diabetes;Stress;Hypertension;Lipids   Review Broghan continues ot exercise are home and here to improve his strength and stamina.  He has been doing well with his blood pressures and blood sugars.  He has not had any stress issues and no problems taking his statin medication.   Expected Outcomes Robyn will continue to attend classes and exercise at home for continued improvements.  We will continue to monitor  for progression.      Nutrition & Weight - Outcomes:     Pre Biometrics - 03/31/16 1521      Pre Biometrics   Height 5' 6.5" (1.689 m)   Weight 152 lb (68.9 kg)   Waist Circumference 35.25 inches   Hip Circumference 38.5 inches   Waist to Hip Ratio 0.92 %   BMI (Calculated) 24.2   Single Leg Stand 30 seconds         Post Biometrics - 06/11/16 4098       Post  Biometrics   Height 5' 6.5" (1.689 m)   Weight 155 lb (70.3 kg)   Waist Circumference 35 inches   Hip Circumference 38 inches   Waist to Hip Ratio 0.92 %   BMI (Calculated) 24.7   Single Leg Stand 30 seconds      Nutrition:     Nutrition Therapy & Goals - 03/31/16 1652      Intervention Plan   Intervention Prescribe, educate and counsel regarding individualized specific dietary modifications aiming towards targeted core components such as weight, hypertension, lipid management, diabetes, heart failure and other comorbidities.   Expected Outcomes Short Term Goal: Understand basic principles of dietary content, such as calories, fat, sodium, cholesterol and nutrients.;Short Term Goal: A plan has been developed with personal nutrition goals set during dietitian appointment.;Long Term Goal: Adherence to prescribed nutrition plan.      Nutrition Discharge:     Nutrition Assessments - 06/17/16 1423      Rate Your Plate Scores   Pre Score 71   Pre Score % 78.8 %   Post Score 74   Post Score % 82 %   % Change 3.2 %      Education Questionnaire Score:     Knowledge Questionnaire Score - 06/17/16 1423      Knowledge Questionnaire Score   Post Score 23/28      Goals reviewed with patient; copy given to patient.

## 2016-06-30 ENCOUNTER — Encounter: Payer: Medicare Other | Admitting: *Deleted

## 2016-06-30 DIAGNOSIS — I214 Non-ST elevation (NSTEMI) myocardial infarction: Secondary | ICD-10-CM

## 2016-06-30 DIAGNOSIS — Z951 Presence of aortocoronary bypass graft: Secondary | ICD-10-CM | POA: Diagnosis not present

## 2016-06-30 NOTE — Progress Notes (Signed)
Discharge Summary  Patient Details  Name: Tyler Russell MRN: [id number] Date of Birth: 03/04/1956 Referring Provider:   Flowsheet Row Cardiac Rehab from 03/31/2016 in [facility]  Referring Provider  Bartle       Number of Visits: 3  Reason for Discharge:  Patient reached a stable level of exercise. Patient independent in their exercise.  Smoking History:  History  Smoking Status  . Former Smoker  . Types: Cigarettes  Smokeless Tobacco  . Not on file    Comment: stopped at age 60 years    Diagnosis:  NSTEMI (non-ST elevated myocardial infarction) (HCC)  S/P CABG x 3  ADL UCSD:   Initial Exercise Prescription:     Initial Exercise Prescription - 03/31/16 1500      Date of Initial Exercise RX and Referring Provider   Date 03/31/16   Referring Provider Bartle     Treadmill   MPH 2.5   Grade 2.5   Minutes 15   METs 3.78     Recumbant Bike   Level 3   RPM 60   Watts 40   Minutes 15   METs 3.8     NuStep   Level 3   Watts 60   Minutes 15   METs 3.8     Elliptical   Level 1   Speed 50   Minutes 5     REL-XR   Level 3   Watts 60   Minutes 15   METs 3.8     T5 Nustep   Level 2   Watts 60   Minutes 15   METs 3.8     Prescription Details   Frequency (times per week) 3   Duration Progress to 30 minutes of continuous aerobic without signs/symptoms of physical distress     Intensity   THRR 40-80% of Max Heartrate 111-144   Ratings of Perceived Exertion 11-13     Progression   Progression Continue to progress workloads to maintain intensity without signs/symptoms of physical distress.     Resistance Training   Training Prescription Yes   Weight 3      Discharge Exercise Prescription (Final Exercise Prescription Changes):     Exercise Prescription Changes - 06/17/16 1400      Exercise Review   Progression Yes     Response to Exercise   Blood Pressure (Admit) 122/70   Blood Pressure (Exercise) 170/80    Blood Pressure (Exit) 110/62   Heart Rate (Admit) 81 bpm   Heart Rate (Exercise) 143 bpm   Heart Rate (Exit) 103 bpm   Rating of Perceived Exertion (Exercise) 12   Symptoms none   Comments Home Exercise Guidelines given 04/30/16   Duration Progress to 45 minutes of aerobic exercise without signs/symptoms of physical distress   Intensity THRR unchanged     Progression   Progression Continue to progress workloads to maintain intensity without signs/symptoms of physical distress.   Average METs 4.68     Resistance Training   Training Prescription Yes   Weight 7 lbs   Reps 10-15     Interval Training   Interval Training No     Treadmill   MPH 3   Grade 3   Minutes 15   METs 4.54     Elliptical   Level 6   Speed 4.3   Minutes 15     T5 Nustep   Level 5   Minutes 15   METs 3.4  Home Exercise Plan   Plans to continue exercise at Home  walking and total gym   Frequency Add 2 additional days to program exercise sessions.      Functional Capacity:     6 Minute Walk    Row Name 03/31/16 1511 06/11/16 0923       6 Minute Walk   Phase  - Discharge    Distance 1422 feet 1688 feet    Distance % Change  - 18 %    Walk Time 6 minutes 6 minutes    # of Rest Breaks  - 0    MPH 2.7 3.2    METS 4 4.3    RPE 11 11    VO2 Peak 14.04 15.2    Symptoms No No    Resting HR 78 bpm 95 bpm    Resting BP  - 94/60    Max Ex. HR 112 bpm 109 bpm    Max Ex. BP 132/64 122/64       Psychological, QOL, Others - Outcomes: PHQ 2/9: [facility] 06/17/2016 03/31/2016  Decreased Interest 0 0  Down, Depressed, Hopeless 0 0  PHQ - 2 Score 0 0  Altered sleeping 0 0  Tired, decreased energy 0 3  Change in appetite 0 0  Feeling bad or failure about yourself  0 0  Trouble concentrating 0 3  Moving slowly or fidgety/restless 0 0  Suicidal thoughts 0 0  PHQ-9 Score 0 6  Difficult doing work/chores Not difficult at all Somewhat difficult    Quality of Life:      Quality of Life - 06/17/16 1423      Quality of Life Scores   Health/Function Pre 22.57 %   Health/Function Post 25.6 %   Health/Function % Change 13.42 %   Socioeconomic Pre 22 %   Socioeconomic Post 23.63 %   Socioeconomic % Change  7.41 %   Psych/Spiritual Pre 29.64 %   Psych/Spiritual Post 28.07 %   Psych/Spiritual % Change -5.3 %   Family Pre 13.2 %   Family Post 21.8 %   Family % Change 65.15 %   GLOBAL Pre 22.53 %   GLOBAL Post 25.1 %   GLOBAL % Change 11.41 %      Personal Goals: Goals established at orientation with interventions provided to work toward goal.     Personal Goals and Risk Factors at Admission - 03/31/16 1652      Core Components/Risk Factors/Patient Goals on Admission   Increase Strength and Stamina Yes   Intervention Provide advice, education, support and counseling about physical activity/exercise needs.;Develop an individualized exercise prescription for aerobic and resistive training based on initial evaluation findings, risk stratification, comorbidities and participant's personal goals.   Expected Outcomes Achievement of increased cardiorespiratory fitness and enhanced flexibility, muscular endurance and strength shown through measurements of functional capacity and personal statement of participant.   Diabetes Yes   Intervention Provide education about signs/symptoms and action to take for hypo/hyperglycemia.;Provide education about proper nutrition, including hydration, and aerobic/resistive exercise prescription along with prescribed medications to achieve blood glucose in normal ranges: Fasting glucose 65-99 mg/dL   Expected Outcomes Short Term: Participant verbalizes understanding of the signs/symptoms and immediate care of hyper/hypoglycemia, proper foot care and importance of medication, aerobic/resistive exercise and nutrition plan for blood glucose control.;Long Term: Attainment of HbA1C < 7%.   Hypertension Yes   Intervention Provide  education on lifestyle modifcations including regular physical activity/exercise, weight management, moderate sodium restriction  and increased consumption of fresh fruit, vegetables, and low fat dairy, alcohol moderation, and smoking cessation.;Monitor prescription use compliance.   Expected Outcomes Short Term: Continued assessment and intervention until BP is < 140/62mm HG in hypertensive participants. < 130/75mm HG in hypertensive participants with diabetes, heart failure or chronic kidney disease.;Long Term: Maintenance of blood pressure at goal levels.   Lipids Yes   Intervention Provide education and support for participant on nutrition & aerobic/resistive exercise along with prescribed medications to achieve LDL 70mg , HDL >40mg .   Expected Outcomes Short Term: Participant states understanding of desired cholesterol values and is compliant with medications prescribed. Participant is following exercise prescription and nutrition guidelines.;Long Term: Cholesterol controlled with medications as prescribed, with individualized exercise RX and with personalized nutrition plan. Value goals: LDL < 70mg , HDL > 40 mg.   Stress Yes   Intervention Offer individual and/or small group education and counseling on adjustment to heart disease, stress management and health-related lifestyle change. Teach and support self-help strategies.;Refer participants experiencing significant psychosocial distress to appropriate mental health specialists for further evaluation and treatment. When possible, include family members and significant others in education/counseling sessions.   Expected Outcomes Short Term: Participant demonstrates changes in health-related behavior, relaxation and other stress management skills, ability to obtain effective social support, and compliance with psychotropic medications if prescribed.;Long Term: Emotional wellbeing is indicated by absence of clinically significant psychosocial distress or  social isolation.       Personal Goals Discharge:     Goals and Risk Factor Review    Row Name 04/14/16 0917 04/16/16 1018 05/12/16 0923 06/02/16 1000       Core Components/Risk Factors/Patient Goals Review   Personal Goals Review Weight Management/Obesity;Sedentary;Heart Failure;Diabetes;Stress;Hypertension;Lipids Lipids;Stress;Hypertension;Diabetes Diabetes;Hypertension;Lipids;Stress Sedentary;Increase Strength and Stamina;Diabetes;Stress;Hypertension;Lipids    Review Koston is off to a good start in the program.  He  feeling a little stronger and a little more stamina after a week in.  He does not check his blood pressure at home, but it has been good while here at rehab.  His blood sugars have all been under 150s.  He has had no problems with his statins.  He also said that he has no more stress. Todrick blood sugars have all been under 200 recently.  He is not checking his blood pressure at home, but it has been good in rehab (120s resting). He has not had any recent blood work and continues to take his statin.  And he says that he does not have any stress. Ledell reports good diabetes managment. He consistantly checks his blood sugars once a day. Blood pressures have also been in acceptable ranges and weight has been steady. He is taking his statin consistantly and has not aches or pains due to this medication. He stated that he is feeling stonger and has increased his stamina. He is not having any problems with stress levels.  Asani continues ot exercise are home and here to improve his strength and stamina.  He has been doing well with his blood pressures and blood sugars.  He has not had any stress issues and no problems taking his statin medication.    Expected Outcomes Shawn will continue to come to class for education and exercise.  We will continue to monitor his blood pressure.  Since his blood sugars have been good we will stop checking while here unless symptomatic. Attikus will  continue to come to exercise and education classes. We will continue to monitor his blood pressure. Jerret will continue  his consistant attendance to exercise and education classes. He will continue to progress with his exercise which will aid in continued strength gains, and BP, BG, and lipid panal management.  Menelik will continue to attend classes and exercise at home for continued improvements.  We will continue to monitor for progression.       Nutrition & Weight - Outcomes:     Pre Biometrics - 03/31/16 1521      Pre Biometrics   Height 5' 6.5" (1.689 m)   Weight 152 lb (68.9 kg)   Waist Circumference 35.25 inches   Hip Circumference 38.5 inches   Waist to Hip Ratio 0.92 %   BMI (Calculated) 24.2   Single Leg Stand 30 seconds         Post Biometrics - 06/11/16 1610       Post  Biometrics   Height 5' 6.5" (1.689 m)   Weight 155 lb (70.3 kg)   Waist Circumference 35 inches   Hip Circumference 38 inches   Waist to Hip Ratio 0.92 %   BMI (Calculated) 24.7   Single Leg Stand 30 seconds      Nutrition:     Nutrition Therapy & Goals - 03/31/16 1652      Intervention Plan   Intervention Prescribe, educate and counsel regarding individualized specific dietary modifications aiming towards targeted core components such as weight, hypertension, lipid management, diabetes, heart failure and other comorbidities.   Expected Outcomes Short Term Goal: Understand basic principles of dietary content, such as calories, fat, sodium, cholesterol and nutrients.;Short Term Goal: A plan has been developed with personal nutrition goals set during dietitian appointment.;Long Term Goal: Adherence to prescribed nutrition plan.      Nutrition Discharge:     Nutrition Assessments - 06/17/16 1423      Rate Your Plate Scores   Pre Score 71   Pre Score % 78.8 %   Post Score 74   Post Score % 82 %   % Change 3.2 %      Education Questionnaire Score:     Knowledge Questionnaire  Score - 06/17/16 1423      Knowledge Questionnaire Score   Post Score 23/28      Goals reviewed with patient; copy given to patient.

## 2016-06-30 NOTE — Progress Notes (Signed)
Daily Session Note  Patient Details  Name: Tyler Russell MRN: [id number] Date of Birth: [birth year] Referring Provider:   Flowsheet Row Cardiac Rehab from 03/31/2016 in [facility]  Referring Provider  Bartle      Encounter Date: 06/30/2016  Check In:     Session Check In - 06/30/16 0918      Check-In   Location [facility]-Cardiac & Pulmonary Rehab   Staff Present Alberteen Sam, MA, ACSM RCEP, Exercise Physiologist;Kelly Amedeo Plenty, BS, ACSM CEP, Exercise Physiologist;Carroll Enterkin, RN, BSN   Supervising physician immediately available to respond to emergencies See telemetry face sheet for immediately available ER MD   Medication changes reported     No   Fall or balance concerns reported    No   Warm-up and Cool-down Performed on first and last piece of equipment   Resistance Training Performed Yes   VAD Patient? No     Pain Assessment   Currently in Pain? No/denies   Multiple Pain Sites No         Goals Met:  Independence with exercise equipment Exercise tolerated well No report of cardiac concerns or symptoms Strength training completed today  Goals Unmet:  Not Applicable  Comments:  Sagar graduated today from cardiac rehab with 36 sessions completed.  Details of the patient's exercise prescription and what He needs to do in order to continue the prescription and progress were discussed with patient.  Patient was given a copy of prescription and goals.  Patient verbalized understanding.  Garon plans to continue to exercise by walking and using total gym at home.     Dr. Emily Filbert is Medical Director for [facility].

## 2016-06-30 NOTE — Progress Notes (Signed)
Cardiac Individual Treatment Plan  Patient Details  Name: Tyler Russell MRN: [id number] Date of Birth: 12-15-1955 Referring Provider:   Flowsheet Row Cardiac Rehab from 03/31/2016 in [facility] Cardiac and Pulmonary Rehab  Referring Provider  Bartle      Initial Encounter Date:  Flowsheet Row Cardiac Rehab from 03/31/2016 in [facility] Cardiac and Pulmonary Rehab  Date  03/31/16  Referring Provider  Bartle      Visit Diagnosis: NSTEMI (non-ST elevated myocardial infarction) ([place])  S/P CABG x 3  Patient's Home Medications on Admission:  Current Outpatient Prescriptions:  .  aspirin 81 MG tablet, Take 81 mg by mouth daily., Disp: , Rfl:  .  aspirin EC 325 MG EC tablet, Take 1 tablet (325 mg total) by mouth daily. (Patient not taking: Reported on 03/31/2016), Disp: 30 tablet, Rfl: 0 .  atorvastatin (LIPITOR) 40 MG tablet, Take 1 tablet (40 mg total) by mouth daily at 6 PM. (Patient not taking: Reported on 03/31/2016), Disp: 30 tablet, Rfl: 3 .  blood glucose meter kit and supplies, Dispense based on patient and insurance preference. Use up to four times daily as directed. (FOR ICD-9 250.00, 250.01)., Disp: 1 each, Rfl: 0 .  gemfibrozil (LOPID) 600 MG tablet, Take 600 mg by mouth 2 (two) times daily before a meal., Disp: , Rfl:  .  lisinopril (ZESTRIL) 2.5 MG tablet, Take 1 tablet (2.5 mg total) by mouth daily., Disp: 30 tablet, Rfl: 3 .  metFORMIN (GLUCOPHAGE) 850 MG tablet, Take 1 tablet (850 mg total) by mouth 2 (two) times daily with a meal. (Patient not taking: Reported on 04/02/2016), Disp: 60 tablet, Rfl: 3 .  metoprolol (LOPRESSOR) 50 MG tablet, Take 1 tablet (50 mg total) by mouth 2 (two) times daily., Disp: 60 tablet, Rfl: 3 .  omeprazole (PRILOSEC) 20 MG capsule, Take 20 mg by mouth daily. , Disp: , Rfl:  .  traMADol (ULTRAM) 50 MG tablet, Take 1-2 tablets (50-100 mg total) by mouth every 4 (four) hours as needed for moderate pain. (Patient not taking: Reported on 04/02/2016), Disp: 30  tablet, Rfl: 0  Past Medical History: Past Medical History:  Diagnosis Date  . Alcohol abuse   . Arthritis   . Chronic back pain   . Chronic kidney disease   . Complication of anesthesia    had difficulty with breathing, had difficulty waking up  . Gonorrhea   . Headache(784.0)   . Hypercholesteremia    no medication at this time  . Hypertension   . Trigger finger    on both hands  . Ulcer ([place])    on medication    Tobacco Use: History  Smoking Status  . Former Smoker  . Types: Cigarettes  Smokeless Tobacco  . Not on file    Comment: stopped at age 38 years    Labs: Recent Review Flowsheet Data    Labs for ITP Cardiac and Pulmonary Rehab Latest Ref Rng & Units 02/29/2016 02/29/2016 02/29/2016 02/29/2016 03/01/2016   Cholestrol 0 - 200 mg/dL - - - - -   LDLCALC 0 - 99 mg/dL - - - - -   HDL >40 mg/dL - - - - -   Trlycerides <150 mg/dL - - - - -   Hemoglobin A1c 4.8 - 5.6 % - - - - -   PHART 7.350 - 7.450 7.424 7.312(L) 7.328(L) - -   PCO2ART 35.0 - 45.0 mmHg 34.2(L) 40.7 37.8 - -   HCO3 20.0 - 24.0 mEq/L 22.7 20.8 19.9(L) - -  TCO2 0 - 100 mmol/L _0 ACIDBASEDEF 0.0 - 2.0 mmol/L 2.0 5.0(H) 6.0(H) - -   O2SAT % 97.0 98.0 98.0 - -       Exercise Target Goals:    Exercise Program Goal: Individual exercise prescription set with THRR, safety & activity barriers. Participant demonstrates ability to understand and report RPE using BORG scale, to self-measure pulse accurately, and to acknowledge the importance of the exercise prescription.  Exercise Prescription Goal: Starting with aerobic activity 30 plus minutes a day, 3 days per week for initial exercise prescription. Provide home exercise prescription and guidelines that participant acknowledges understanding prior to discharge.  Activity Barriers & Risk Stratification:     Activity Barriers & Cardiac Risk Stratification - 03/31/16 1659      Activity Barriers & Cardiac Risk Stratification   Activity  Barriers Arthritis;Shortness of Breath;Joint Problems;Deconditioning  arthritis /joints- back, elbows,shoulders. SOB"I think is more sinus since my surgery"   Cardiac Risk Stratification High      6 Minute Walk:     6 Minute Walk    Row Name 03/31/16 1511 06/11/16 0923       6 Minute Walk   Phase  - Discharge    Distance 1422 feet 1688 feet    Distance % Change  - 18 %    Walk Time 6 minutes 6 minutes    # of Rest Breaks  - 0    MPH 2.7 3.2    METS 4 4.3    RPE 11 11    VO2 Peak 14.04 15.2    Symptoms No No    Resting HR 78 bpm 95 bpm    Resting BP  - 94/60    Max Ex. HR 112 bpm 109 bpm    Max Ex. BP 132/64 122/64       Initial Exercise Prescription:     Initial Exercise Prescription - 03/31/16 1500      Date of Initial Exercise RX and Referring Provider   Date 03/31/16   Referring Provider Bartle     Treadmill   MPH 2.5   Grade 2.5   Minutes 15   METs 3.78     Recumbant Bike   Level 3   RPM 60   Watts 40   Minutes 15   METs 3.8     NuStep   Level 3   Watts 60   Minutes 15   METs 3.8     Elliptical   Level 1   Speed 50   Minutes 5     REL-XR   Level 3   Watts 60   Minutes 15   METs 3.8     T5 Nustep   Level 2   Watts 60   Minutes 15   METs 3.8     Prescription Details   Frequency (times per week) 3   Duration Progress to 30 minutes of continuous aerobic without signs/symptoms of physical distress     Intensity   THRR 40-80% of Max Heartrate 111-144   Ratings of Perceived Exertion 11-13     Progression   Progression Continue to progress workloads to maintain intensity without signs/symptoms of physical distress.     Resistance Training   Training Prescription Yes   Weight 3      Perform Capillary Blood Glucose checks as needed.  Exercise Prescription Changes:     Exercise Prescription Changes    Row Name 03/31/16 1230 04/08/16 1300 04/22/16 1500  04/30/16 0800 05/08/16 1100     Exercise Review   Progression  - No   First full day of exercise Yes  - Yes     Response to Exercise   Blood Pressure (Admit)  - 122/66 112/64  - 114/60   Blood Pressure (Exercise) 132/64 136/64 136/70  - 128/64   Blood Pressure (Exit) 114/70 92/52 94/50  - 104/62   Heart Rate (Admit) 67 bpm 71 bpm 78 bpm  - 73 bpm   Heart Rate (Exercise) 112 bpm 124 bpm 123 bpm  - 109 bpm   Heart Rate (Exit) 73 bpm 70 bpm 77 bpm  - 83 bpm   Rating of Perceived Exertion (Exercise) _0 - 10   Symptoms  - Dizziness after elliptical and after weights  -  -  -   Comments  -  -  -  - Home Exercise Guidelines given 04/30/16   Duration  - Progress to 45 minutes of aerobic exercise without signs/symptoms of physical distress Progress to 45 minutes of aerobic exercise without signs/symptoms of physical distress  - Progress to 45 minutes of aerobic exercise without signs/symptoms of physical distress   Intensity  - THRR unchanged THRR unchanged  - THRR unchanged     Progression   Progression  - Continue to progress workloads to maintain intensity without signs/symptoms of physical distress. Continue to progress workloads to maintain intensity without signs/symptoms of physical distress.  - Continue to progress workloads to maintain intensity without signs/symptoms of physical distress.   Average METs  - 4.35 3.96  - 4.64     Resistance Training   Training Prescription  - Yes Yes  - Yes   Weight  - 3 3 lbs  - 5 lbs   Reps  - 10-12 10-15  - 10-15     Interval Training   Interval Training  - No No  - No     Treadmill   MPH  -  - 2.5  - 3   Grade  -  - 2.5  - 3   Minutes  -  - 15  - 15   METs  -  - 3.78  - 4.54     Elliptical   Level  - 1 1  - 1   Speed  - 3 4.3  - 4.3   Minutes  - 15 15  - 15     T5 Nustep   Level  - 2 2  - 4   Minutes  - 15 15  - 15   METs  - 2.2 2.1  - 2.5     Home Exercise Plan   Plans to continue exercise at  -  -  - Home Home  walking and total gym   Frequency  -  -  - Add 2 additional days to program  exercise sessions. Add 2 additional days to program exercise sessions.   [place] Name 05/21/16 1500 06/04/16 1500 06/17/16 1400         Exercise Review   Progression Yes Yes Yes       Response to Exercise   Blood Pressure (Admit) 90/58  rck 104/54 after water 104/64 122/70     Blood Pressure (Exercise) 142/64 130/58 170/80     Blood Pressure (Exit) 120/70 104/64 110/62     Heart Rate (Admit) 77 bpm 89 bpm 81 bpm     Heart Rate (Exercise) 126 bpm 110 bpm 143 bpm  Heart Rate (Exit) 89 bpm 78 bpm 103 bpm     Rating of Perceived Exertion (Exercise) _0 Symptoms none none none     Comments Home Exercise Guidelines given 04/30/16 Home Exercise Guidelines given 04/30/16 Home Exercise Guidelines given 04/30/16     Duration Progress to 45 minutes of aerobic exercise without signs/symptoms of physical distress Progress to 45 minutes of aerobic exercise without signs/symptoms of physical distress Progress to 45 minutes of aerobic exercise without signs/symptoms of physical distress     Intensity THRR unchanged THRR unchanged THRR unchanged       Progression   Progression Continue to progress workloads to maintain intensity without signs/symptoms of physical distress. Continue to progress workloads to maintain intensity without signs/symptoms of physical distress. Continue to progress workloads to maintain intensity without signs/symptoms of physical distress.     Average METs 5.52 4.68 4.68       Resistance Training   Training Prescription Yes Yes Yes     Weight 7 lbs 7 lbs 7 lbs     Reps 10-15 10-15 10-15       Interval Training   Interval Training No No No       Treadmill   MPH _1 Grade _2 Minutes _3 METs 4.54 4.54 4.54       Elliptical   Level _4 Speed 4.3 4.3 4.3     Minutes _5 T5 Nustep   Level _6 Minutes _7 METs  - 3 3.4       Home Exercise Plan   Plans to continue exercise at Home  walking and total gym  Home  walking and total gym Home  walking and total gym     Frequency Add 2 additional days to program exercise sessions. Add 2 additional days to program exercise sessions. Add 2 additional days to program exercise sessions.        Exercise Comments:     Exercise Comments    Row Name 03/31/16 1522 04/07/16 0916 04/11/16 1016 04/22/16 1525 04/30/16 0836   Exercise Comments Mr. Jedlicka has no injuries that affect exercise. First full day of exercise!  Patient was oriented to gym and equipment including functions, settings, policies, and procedures.  Patient's individual exercise prescription and treatment plan were reviewed.  All starting workloads were established based on the results of the 6 minute walk test done at initial orientation visit.  The plan for exercise progression was also introduced and progression will be customized based on patient's performance and goals. Reviewed METs average and discussed progression with pt today. Kipling is off to a good start with exercise.  He is already doing 4.3 mph on the elliptical.  We will continue to monitor for progression. Home exercise was reviewed with Quadir today.   Row Name 04/30/16 [phone] 05/08/16 1139 05/21/16 0910 05/21/16 1542 05/26/16 0843   Exercise Comments  Reviewed home exercise with pt today.  Pt plans to walk and use total gym for exercise.  Reviewed THR, pulse, RPE, sign and symptoms, NTG use, and when to call 911 or MD.  Also discussed weather considerations and indoor options.  Pt voiced understanding. Olon is doing well with exercise in rehab.  He is already up to  level 4 on the T5 NuStep.  We will continue to monitor for progression. Shaydon's blood pressure was low checking in, 90/50.  We gave him some water and blood pressure came up to 102. Rennie continues to do well with exercise.  He is walking at home and using his total gym on his off days from rehab.  We will continue to monitor his progression. Reviewed METs average and  discussed progression with pt today.   [place] Name 06/04/16 1532 06/17/16 1421 06/27/16 1022 06/30/16 0920     Exercise Comments Crandall continues to do well with exercise. He is working hard on the equipment.  We will continue to monitor for progression. Tiago is nearing graduation already!  He had an 18% increase in his walk test distance!  We will continue to monitor until graduation! Reviewed METs today and discussed progression.  Harlen graduated today from cardiac rehab with 36 sessions completed.  Details of the patient's exercise prescription and what He needs to do in order to continue the prescription and progress were discussed with patient.  Patient was given a copy of prescription and goals.  Patient verbalized understanding.  Leory plans to continue to exercise by walking and using total gym at home.       Discharge Exercise Prescription (Final Exercise Prescription Changes):     Exercise Prescription Changes - 06/17/16 1400      Exercise Review   Progression Yes     Response to Exercise   Blood Pressure (Admit) 122/70   Blood Pressure (Exercise) 170/80   Blood Pressure (Exit) 110/62   Heart Rate (Admit) 81 bpm   Heart Rate (Exercise) 143 bpm   Heart Rate (Exit) 103 bpm   Rating of Perceived Exertion (Exercise) 12   Symptoms none   Comments Home Exercise Guidelines given 04/30/16   Duration Progress to 45 minutes of aerobic exercise without signs/symptoms of physical distress   Intensity THRR unchanged     Progression   Progression Continue to progress workloads to maintain intensity without signs/symptoms of physical distress.   Average METs 4.68     Resistance Training   Training Prescription Yes   Weight 7 lbs   Reps 10-15     Interval Training   Interval Training No     Treadmill   MPH 3   Grade 3   Minutes 15   METs 4.54     Elliptical   Level 6   Speed 4.3   Minutes 15     T5 Nustep   Level 5   Minutes 15   METs 3.4     Home Exercise Plan    Plans to continue exercise at Home  walking and total gym   Frequency Add 2 additional days to program exercise sessions.      Nutrition:  Target Goals: Understanding of nutrition guidelines, daily intake of sodium <1539m, cholesterol <2017m calories 30% from fat and 7% or less from saturated fats, daily to have 5 or more servings of fruits and vegetables.  Biometrics:     Pre Biometrics - 03/31/16 1521      Pre Biometrics   Height 5' 6.5" (1.689 m)   Weight 152 lb (68.9 kg)   Waist Circumference 35.25 inches   Hip Circumference 38.5 inches   Waist to Hip Ratio 0.92 %   BMI (Calculated) 24.2   Single Leg Stand 30 seconds         Post Biometrics - 06/11/16 [id number]  Post  Biometrics   Height 5' 6.5" (1.689 m)   Weight 155 lb (70.3 kg)   Waist Circumference 35 inches   Hip Circumference 38 inches   Waist to Hip Ratio 0.92 %   BMI (Calculated) 24.7   Single Leg Stand 30 seconds      Nutrition Therapy Plan and Nutrition Goals:     Nutrition Therapy & Goals - 03/31/16 1652      Intervention Plan   Intervention Prescribe, educate and counsel regarding individualized specific dietary modifications aiming towards targeted core components such as weight, hypertension, lipid management, diabetes, heart failure and other comorbidities.   Expected Outcomes Short Term Goal: Understand basic principles of dietary content, such as calories, fat, sodium, cholesterol and nutrients.;Short Term Goal: A plan has been developed with personal nutrition goals set during dietitian appointment.;Long Term Goal: Adherence to prescribed nutrition plan.      Nutrition Discharge: Rate Your Plate Scores:     Nutrition Assessments - 06/17/16 1423      Rate Your Plate Scores   Pre Score 71   Pre Score % 78.8 %   Post Score 74   Post Score % 82 %   % Change 3.2 %      Nutrition Goals Re-Evaluation:   Psychosocial: Target Goals: Acknowledge presence or absence of depression,  maximize coping skills, provide positive support system. Participant is able to verbalize types and ability to use techniques and skills needed for reducing stress and depression.  Initial Review & Psychosocial Screening:     Initial Psych Review & Screening - 03/31/16 1654      Initial Review   Current issues with Current Stress Concerns  Concerned about daughter with problems that are stressful   Comments Family conerns is causing stress     Family Dynamics   Good Support System? Yes  Wife, family and church     Barriers   Psychosocial barriers to participate in program There are no identifiable barriers or psychosocial needs.;The patient should benefit from training in stress management and relaxation.     Screening Interventions   Interventions Encouraged to exercise      Quality of Life Scores:     Quality of Life - 06/17/16 1423      Quality of Life Scores   Health/Function Pre 22.57 %   Health/Function Post 25.6 %   Health/Function % Change 13.42 %   Socioeconomic Pre 22 %   Socioeconomic Post 23.63 %   Socioeconomic % Change  7.41 %   Psych/Spiritual Pre 29.64 %   Psych/Spiritual Post 28.07 %   Psych/Spiritual % Change -5.3 %   Family Pre 13.2 %   Family Post 21.8 %   Family % Change 65.15 %   GLOBAL Pre 22.53 %   GLOBAL Post 25.1 %   GLOBAL % Change 11.41 %      PHQ-9: Recent Review Flowsheet Data    Depression screen [facility] 2/9 06/17/2016 03/31/2016   Decreased Interest 0 0   Down, Depressed, Hopeless 0 0   PHQ - 2 Score 0 0   Altered sleeping 0 0   Tired, decreased energy 0 3   Change in appetite 0 0   Feeling bad or failure about yourself  0 0   Trouble concentrating 0 3   Moving slowly or fidgety/restless 0 0   Suicidal thoughts 0 0   PHQ-9 Score 0 6   Difficult doing work/chores Not difficult at all Somewhat difficult  Psychosocial Evaluation and Intervention:     Psychosocial Evaluation - 06/30/16 0926      Discharge Psychosocial  Assessment & Intervention   Comments Counselor met with Mr. Galindo today for his discharge summary from this program.  He reports feeling better in every way and was looking forward to working out consistently at home on his Total Gym to maintain this progress.  Mr. C plans to work some part-time and keep his 42 year old grandson upon discharge; which puts a smile on his face!  He reports having learned a lot while in this program and is just "grateful to be alive."  His mood remains positive and his stress is minimal currently.    Counselor congratulated Mr. Arnaud on his hard work and dedication to his improved health.        Psychosocial Re-Evaluation:     Psychosocial Re-Evaluation    [place] Name 04/23/16 [phone] 05/21/16 0945           Psychosocial Re-Evaluation   Comments Counselor follow up today with Mr. Denis reporting feeling better and stronger since coming into this program.  He reports the stress with his daughter has decreased and other than some dizziness lately he is doing much better.   Mr. Cardell is supposed to see his Dr. next week re: his blood pressure medication which he thinks is contributing to the dizziness.  He will report back after this appointment.  He also pointed out a tender spot on his neck/upper back that has been there for awhile and is more pronounced lately.  He will check with his doctor on this as well.   Follow up with Mr. Main today.  He reports great progress since beginning this program with keeping his weight down and being able to get off the medications for borderline diabetes.  He states he feels stronger and a family member mentioned he "looks younger" since starting this program.  Mr. Loletha Grayer states his mood remains positive and he is able to do some part-time work lately as well that helps with financial stress and medical bills.  The situation with his daughter still remains a problem, but Mr. Loletha Grayer reports he is allowing his wife to deal with it and that helps the  relationship all around.  Mr. Loletha Grayer celebrated 36 years of marriage yesterday and stated he is feeling better about being here for more years now.  Counselor commended Mr. Okray on all his hard work and progress made.           Vocational Rehabilitation: Provide vocational rehab assistance to qualifying candidates.   Vocational Rehab Evaluation & Intervention:     Vocational Rehab - 03/31/16 1702      Initial Vocational Rehab Evaluation & Intervention   Assessment shows need for Vocational Rehabilitation No      Education: Education Goals: Education classes will be provided on a weekly basis, covering required topics. Participant will state understanding/return demonstration of topics presented.  Learning Barriers/Preferences:     Learning Barriers/Preferences - 03/31/16 1701      Learning Barriers/Preferences   Learning Barriers Sight;Hearing;Inability to learn new things;Reading  left school at 7 th grade, always trouble learning.  Worked ever since left school.    Learning Preferences Individual Instruction      Education Topics: General Nutrition Guidelines/Fats and Fiber: -Group instruction provided by verbal, written material, models and posters to present the general guidelines for heart healthy nutrition. Gives an explanation and review of dietary fats and fiber.  Flowsheet Row Cardiac Rehab from 06/30/2016 in [facility] Cardiac and Pulmonary Rehab  Date  06/02/16  Educator  CR  Instruction Review Code  2- meets goals/outcomes      Controlling Sodium/Reading Food Labels: -Group verbal and written material supporting the discussion of sodium use in heart healthy nutrition. Review and explanation with models, verbal and written materials for utilization of the food label. Flowsheet Row Cardiac Rehab from 06/30/2016 in [facility] Cardiac and Pulmonary Rehab  Date  04/21/16  Educator  CR  Instruction Review Code  2- meets goals/outcomes      Exercise Physiology & Risk  Factors: - Group verbal and written instruction with models to review the exercise physiology of the cardiovascular system and associated critical values. Details cardiovascular disease risk factors and the goals associated with each risk factor. Flowsheet Row Cardiac Rehab from 06/30/2016 in [facility] Cardiac and Pulmonary Rehab  Date  06/16/16  Educator  [facility]  Instruction Review Code  2- meets goals/outcomes      Aerobic Exercise & Resistance Training: - Gives group verbal and written discussion on the health impact of inactivity. On the components of aerobic and resistive training programs and the benefits of this training and how to safely progress through these programs. Flowsheet Row Cardiac Rehab from 06/30/2016 in [facility] Cardiac and Pulmonary Rehab  Date  04/23/16  Educator  [facility]  Instruction Review Code  2- meets goals/outcomes      Flexibility, Balance, General Exercise Guidelines: - Provides group verbal and written instruction on the benefits of flexibility and balance training programs. Provides general exercise guidelines with specific guidelines to those with heart or lung disease. Demonstration and skill practice provided. Flowsheet Row Cardiac Rehab from 06/30/2016 in [facility] Cardiac and Pulmonary Rehab  Date  04/28/16  Educator  [facility]  Instruction Review Code  2- meets goals/outcomes      Stress Management: - Provides group verbal and written instruction about the health risks of elevated stress, cause of high stress, and healthy ways to reduce stress. Flowsheet Row Cardiac Rehab from 06/30/2016 in [facility] Cardiac and Pulmonary Rehab  Date  04/30/16  Educator  [facility]  Instruction Review Code  2- meets goals/outcomes      Depression: - Provides group verbal and written instruction on the correlation between heart/lung disease and depressed mood, treatment options, and the stigmas associated with seeking treatment. Flowsheet Row Cardiac Rehab from 06/30/2016 in [facility] Cardiac and  Pulmonary Rehab  Date  06/04/16  Educator  [facility]  Instruction Review Code  2- meets goals/outcomes      Anatomy & Physiology of the Heart: - Group verbal and written instruction and models provide basic cardiac anatomy and physiology, with the coronary electrical and arterial systems. Review of: AMI, Angina, Valve disease, Heart Failure, Cardiac Arrhythmia, Pacemakers, and the ICD. Flowsheet Row Cardiac Rehab from 06/30/2016 in [facility] Cardiac and Pulmonary Rehab  Date  06/30/16  Educator  CE  Instruction Review Code  R- Review/reinforce [second class]      Cardiac Procedures: - Group verbal and written instruction and models to describe the testing methods done to diagnose heart disease. Reviews the outcomes of the test results. Describes the treatment choices: Medical Management, Angioplasty, or Coronary Bypass Surgery. Flowsheet Row Cardiac Rehab from 06/30/2016 in [facility] Cardiac and Pulmonary Rehab  Date  05/12/16  Educator  SB  Instruction Review Code  2- meets goals/outcomes      Cardiac Medications: - Group verbal and written instruction to review commonly prescribed medications for heart  disease. Reviews the medication, class of the drug, and side effects. Includes the steps to properly store meds and maintain the prescription regimen. Flowsheet Row Cardiac Rehab from 06/30/2016 in [facility] Cardiac and Pulmonary Rehab  Date  05/21/16  Educator  SB  Instruction Review Code  2- meets goals/outcomes      Go Sex-Intimacy & Heart Disease, Get SMART - Goal Setting: - Group verbal and written instruction through game format to discuss heart disease and the return to sexual intimacy. Provides group verbal and written material to discuss and apply goal setting through the application of the S.M.A.R.T. Method. Flowsheet Row Cardiac Rehab from 06/30/2016 in [facility] Cardiac and Pulmonary Rehab  Date  05/12/16  Educator  SB  Instruction Review Code  2- meets goals/outcomes      Other Matters  of the Heart: - Provides group verbal, written materials and models to describe Heart Failure, Angina, Valve Disease, and Diabetes in the realm of heart disease. Includes description of the disease process and treatment options available to the cardiac patient. Flowsheet Row Cardiac Rehab from 06/30/2016 in [facility] Cardiac and Pulmonary Rehab  Date  06/30/16  Educator  CE  Instruction Review Code  R- Review/reinforce [second class]      Exercise & Equipment Safety: - Individual verbal instruction and demonstration of equipment use and safety with use of the equipment. Flowsheet Row Cardiac Rehab from 06/30/2016 in [facility] Cardiac and Pulmonary Rehab  Date  03/31/16  Educator  sb  Instruction Review Code  2- meets goals/outcomes      Infection Prevention: - Provides verbal and written material to individual with discussion of infection control including proper hand washing and proper equipment cleaning during exercise session. Flowsheet Row Cardiac Rehab from 06/30/2016 in [facility] Cardiac and Pulmonary Rehab  Date  03/31/16  Educator  sb  Instruction Review Code  2- meets goals/outcomes      Falls Prevention: - Provides verbal and written material to individual with discussion of falls prevention and safety.   Diabetes: - Individual verbal and written instruction to review signs/symptoms of diabetes, desired ranges of glucose level fasting, after meals and with exercise. Advice that pre and post exercise glucose checks will be done for 3 sessions at entry of program. [place] from 06/30/2016 in [facility] Cardiac and Pulmonary Rehab  Date  03/31/16  Educator  sb  Instruction Review Code  2- meets goals/outcomes       Knowledge Questionnaire Score:     Knowledge Questionnaire Score - 06/17/16 1423      Knowledge Questionnaire Score   Post Score 23/28      Core Components/Risk Factors/Patient Goals at Admission:     Personal Goals and Risk Factors at Admission -  03/31/16 1652      Core Components/Risk Factors/Patient Goals on Admission   Increase Strength and Stamina Yes   Intervention Provide advice, education, support and counseling about physical activity/exercise needs.;Develop an individualized exercise prescription for aerobic and resistive training based on initial evaluation findings, risk stratification, comorbidities and participant's personal goals.   Expected Outcomes Achievement of increased cardiorespiratory fitness and enhanced flexibility, muscular endurance and strength shown through measurements of functional capacity and personal statement of participant.   Diabetes Yes   Intervention Provide education about signs/symptoms and action to take for hypo/hyperglycemia.;Provide education about proper nutrition, including hydration, and aerobic/resistive exercise prescription along with prescribed medications to achieve blood glucose in normal ranges: Fasting glucose 65-99 mg/dL   Expected Outcomes Short Term: Participant  verbalizes understanding of the signs/symptoms and immediate care of hyper/hypoglycemia, proper foot care and importance of medication, aerobic/resistive exercise and nutrition plan for blood glucose control.;Long Term: Attainment of HbA1C < 7%.   Hypertension Yes   Intervention Provide education on lifestyle modifcations including regular physical activity/exercise, weight management, moderate sodium restriction and increased consumption of fresh fruit, vegetables, and low fat dairy, alcohol moderation, and smoking cessation.;Monitor prescription use compliance.   Expected Outcomes Short Term: Continued assessment and intervention until BP is < 140/54m HG in hypertensive participants. < 130/820mHG in hypertensive participants with diabetes, heart failure or chronic kidney disease.;Long Term: Maintenance of blood pressure at goal levels.   Lipids Yes   Intervention Provide education and support for participant on nutrition &  aerobic/resistive exercise along with prescribed medications to achieve LDL '70mg'$ , HDL >'40mg'$ .   Expected Outcomes Short Term: Participant states understanding of desired cholesterol values and is compliant with medications prescribed. Participant is following exercise prescription and nutrition guidelines.;Long Term: Cholesterol controlled with medications as prescribed, with individualized exercise RX and with personalized nutrition plan. Value goals: LDL < '70mg'$ , HDL > 40 mg.   Stress Yes   Intervention Offer individual and/or small group education and counseling on adjustment to heart disease, stress management and health-related lifestyle change. Teach and support self-help strategies.;Refer participants experiencing significant psychosocial distress to appropriate mental health specialists for further evaluation and treatment. When possible, include family members and significant others in education/counseling sessions.   Expected Outcomes Short Term: Participant demonstrates changes in health-related behavior, relaxation and other stress management skills, ability to obtain effective social support, and compliance with psychotropic medications if prescribed.;Long Term: Emotional wellbeing is indicated by absence of clinically significant psychosocial distress or social isolation.      Core Components/Risk Factors/Patient Goals Review:      Goals and Risk Factor Review    Row Name 04/14/16 0917 04/16/16 1018 05/12/16 0923 06/02/16 1000       Core Components/Risk Factors/Patient Goals Review   Personal Goals Review Weight Management/Obesity;Sedentary;Heart Failure;Diabetes;Stress;Hypertension;Lipids Lipids;Stress;Hypertension;Diabetes Diabetes;Hypertension;Lipids;Stress Sedentary;Increase Strength and Stamina;Diabetes;Stress;Hypertension;Lipids    Review FrVitors off to a good start in the program.  He  feeling a little stronger and a little more stamina after a week in.  He does not check his  blood pressure at home, but it has been good while here at rehab.  His blood sugars have all been under 150s.  He has had no problems with his statins.  He also said that he has no more stress. [place] blood sugars have all been under 200 recently.  He is not checking his blood pressure at home, but it has been good in rehab (120s resting). He has not had any recent blood work and continues to take his statin.  And he says that he does not have any stress. Jamaurie reports good diabetes managment. He consistantly checks his blood sugars once a day. Blood pressures have also been in acceptable ranges and weight has been steady. He is taking his statin consistantly and has not aches or pains due to this medication. He stated that he is feeling stonger and has increased his stamina. He is not having any problems with stress levels.  Tywone continues ot exercise are home and here to improve his strength and stamina.  He has been doing well with his blood pressures and blood sugars.  He has not had any stress issues and no problems taking his statin medication.    Expected Outcomes FrJunoill  continue to come to class for education and exercise.  We will continue to monitor his blood pressure.  Since his blood sugars have been good we will stop checking while here unless symptomatic. Aviraj will continue to come to exercise and education classes. We will continue to monitor his blood pressure. Fremon will continue his consistant attendance to exercise and education classes. He will continue to progress with his exercise which will aid in continued strength gains, and BP, BG, and lipid panal management.  Ihor will continue to attend classes and exercise at home for continued improvements.  We will continue to monitor for progression.       Core Components/Risk Factors/Patient Goals at Discharge (Final Review):      Goals and Risk Factor Review - 06/02/16 1000      Core Components/Risk Factors/Patient  Goals Review   Personal Goals Review Sedentary;Increase Strength and Stamina;Diabetes;Stress;Hypertension;Lipids   Review Octave continues ot exercise are home and here to improve his strength and stamina.  He has been doing well with his blood pressures and blood sugars.  He has not had any stress issues and no problems taking his statin medication.   Expected Outcomes Beaux will continue to attend classes and exercise at home for continued improvements.  We will continue to monitor for progression.      ITP Comments:     ITP Comments    Row Name 03/31/16 1637 03/31/16 1643 03/31/16 1707 04/02/16 0837 04/07/16 0916   ITP Comments Medical review and Intitial ITP completed. Documentation of diagnosis Documentation of diagnosis in [place]  03/04/2016 Medical review and Initial ITP completed today 30 day review. Continue with ITP. HAs completed med review and will start sessions next week After exercise was completed, Jadarius complained of feeling dizzy.  He was seated and checked.  Blood pressure was 96/54.  He was given 2 cups of water to increase blood pressure to 104/60.  He was able to do weights and was dizzy again after cool down.  Blood pressure was 92/52.  Then after water 96/60, CBG 96. Pt felt better and was able to sit through education.   [place] Name 04/30/16 [phone] 05/28/16 0626 06/11/16 0942 06/25/16 1128     ITP Comments 30 day review. Continue with ITP unless changes noted by Medical Director at signature of review. 30 day review. Continue with ITP unless changes noted by Medical Director at signature of review. Emidio's blood pressure upon arrival was 94/60 with heart rate 94. I had him drink 240cc of water. After exercise his blood pressure was 88/60 so given another 240 cc of water. Alieu said he is feeling weak today so I encourage him to drink more water. I told him I would notify his MD.  30 day review. Continue with ITP unless changes noted by Medical Director at signature  of review.       Comments: Discharge ITP

## 2016-07-02 ENCOUNTER — Ambulatory Visit: Payer: Self-pay

## 2016-07-02 DIAGNOSIS — R55 Syncope and collapse: Secondary | ICD-10-CM | POA: Diagnosis not present

## 2016-07-04 ENCOUNTER — Ambulatory Visit: Payer: Self-pay

## 2016-07-07 ENCOUNTER — Ambulatory Visit: Payer: Self-pay

## 2016-07-08 DIAGNOSIS — E782 Mixed hyperlipidemia: Secondary | ICD-10-CM | POA: Diagnosis not present

## 2016-07-08 DIAGNOSIS — I2510 Atherosclerotic heart disease of native coronary artery without angina pectoris: Secondary | ICD-10-CM | POA: Diagnosis not present

## 2016-07-08 DIAGNOSIS — F411 Generalized anxiety disorder: Secondary | ICD-10-CM | POA: Diagnosis not present

## 2016-07-08 DIAGNOSIS — R55 Syncope and collapse: Secondary | ICD-10-CM | POA: Diagnosis not present

## 2016-07-09 ENCOUNTER — Ambulatory Visit: Payer: Self-pay

## 2016-07-11 ENCOUNTER — Ambulatory Visit: Payer: Self-pay

## 2016-07-14 ENCOUNTER — Ambulatory Visit: Payer: Self-pay

## 2016-07-16 ENCOUNTER — Ambulatory Visit: Payer: Self-pay

## 2016-07-18 ENCOUNTER — Ambulatory Visit: Payer: Self-pay

## 2016-07-21 ENCOUNTER — Ambulatory Visit: Payer: Self-pay

## 2016-07-23 ENCOUNTER — Ambulatory Visit: Payer: Self-pay

## 2016-07-25 ENCOUNTER — Ambulatory Visit: Payer: Self-pay

## 2016-07-28 ENCOUNTER — Ambulatory Visit: Payer: Self-pay

## 2016-07-30 ENCOUNTER — Ambulatory Visit: Payer: Self-pay

## 2016-10-22 DIAGNOSIS — H2513 Age-related nuclear cataract, bilateral: Secondary | ICD-10-CM | POA: Diagnosis not present

## 2016-10-22 DIAGNOSIS — H52223 Regular astigmatism, bilateral: Secondary | ICD-10-CM | POA: Diagnosis not present

## 2017-03-09 DIAGNOSIS — I10 Essential (primary) hypertension: Secondary | ICD-10-CM | POA: Diagnosis not present

## 2017-03-09 DIAGNOSIS — E119 Type 2 diabetes mellitus without complications: Secondary | ICD-10-CM | POA: Diagnosis not present

## 2017-03-09 DIAGNOSIS — M5010 Cervical disc disorder with radiculopathy, unspecified cervical region: Secondary | ICD-10-CM | POA: Diagnosis not present

## 2017-03-09 DIAGNOSIS — R252 Cramp and spasm: Secondary | ICD-10-CM | POA: Diagnosis not present

## 2017-03-13 ENCOUNTER — Ambulatory Visit
Admission: RE | Admit: 2017-03-13 | Discharge: 2017-03-13 | Disposition: A | Discharge status: Home / Self Care | Payer: Medicare Other | Source: Ambulatory Visit | Attending: Nurse Practitioner | Admitting: Nurse Practitioner

## 2017-03-13 ENCOUNTER — Other Ambulatory Visit: Payer: Self-pay | Admitting: Nurse Practitioner

## 2017-03-13 DIAGNOSIS — M5033 Other cervical disc degeneration, cervicothoracic region: Secondary | ICD-10-CM | POA: Insufficient documentation

## 2017-03-13 DIAGNOSIS — Z9889 Other specified postprocedural states: Secondary | ICD-10-CM | POA: Insufficient documentation

## 2017-03-13 DIAGNOSIS — R252 Cramp and spasm: Secondary | ICD-10-CM | POA: Diagnosis not present

## 2017-03-13 DIAGNOSIS — M542 Cervicalgia: Secondary | ICD-10-CM | POA: Diagnosis present

## 2017-03-13 DIAGNOSIS — M47812 Spondylosis without myelopathy or radiculopathy, cervical region: Secondary | ICD-10-CM | POA: Diagnosis not present

## 2017-05-21 DIAGNOSIS — I10 Essential (primary) hypertension: Secondary | ICD-10-CM | POA: Insufficient documentation

## 2017-05-21 DIAGNOSIS — I25708 Atherosclerosis of coronary artery bypass graft(s), unspecified, with other forms of angina pectoris: Secondary | ICD-10-CM | POA: Diagnosis not present

## 2017-05-21 DIAGNOSIS — E782 Mixed hyperlipidemia: Secondary | ICD-10-CM | POA: Diagnosis not present

## 2017-05-21 DIAGNOSIS — R079 Chest pain, unspecified: Secondary | ICD-10-CM | POA: Diagnosis not present

## 2017-06-15 DIAGNOSIS — Z23 Encounter for immunization: Secondary | ICD-10-CM | POA: Diagnosis not present

## 2017-07-03 DIAGNOSIS — R079 Chest pain, unspecified: Secondary | ICD-10-CM | POA: Diagnosis not present

## 2017-07-06 DIAGNOSIS — I25810 Atherosclerosis of coronary artery bypass graft(s) without angina pectoris: Secondary | ICD-10-CM | POA: Diagnosis not present

## 2017-07-06 DIAGNOSIS — E782 Mixed hyperlipidemia: Secondary | ICD-10-CM | POA: Diagnosis not present

## 2017-07-06 DIAGNOSIS — I10 Essential (primary) hypertension: Secondary | ICD-10-CM | POA: Diagnosis not present

## 2017-08-10 DIAGNOSIS — E782 Mixed hyperlipidemia: Secondary | ICD-10-CM | POA: Diagnosis not present

## 2017-08-10 DIAGNOSIS — I10 Essential (primary) hypertension: Secondary | ICD-10-CM | POA: Diagnosis not present

## 2017-08-10 DIAGNOSIS — I25810 Atherosclerosis of coronary artery bypass graft(s) without angina pectoris: Secondary | ICD-10-CM | POA: Diagnosis not present

## 2017-08-20 IMAGING — CR DG CHEST 2V
1 series · 2 of 2 positions shown · non-contrast
Comparison: 01/27/2011

CLINICAL DATA: Chest pain. Patient ate a large meal which
aggravated his stomach ulcer.

EXAM:
CHEST  2 VIEW

[Series 1: dg chest 2 view · 0.14mm/px · 2 of 2 slices shown]
[im 1/2]
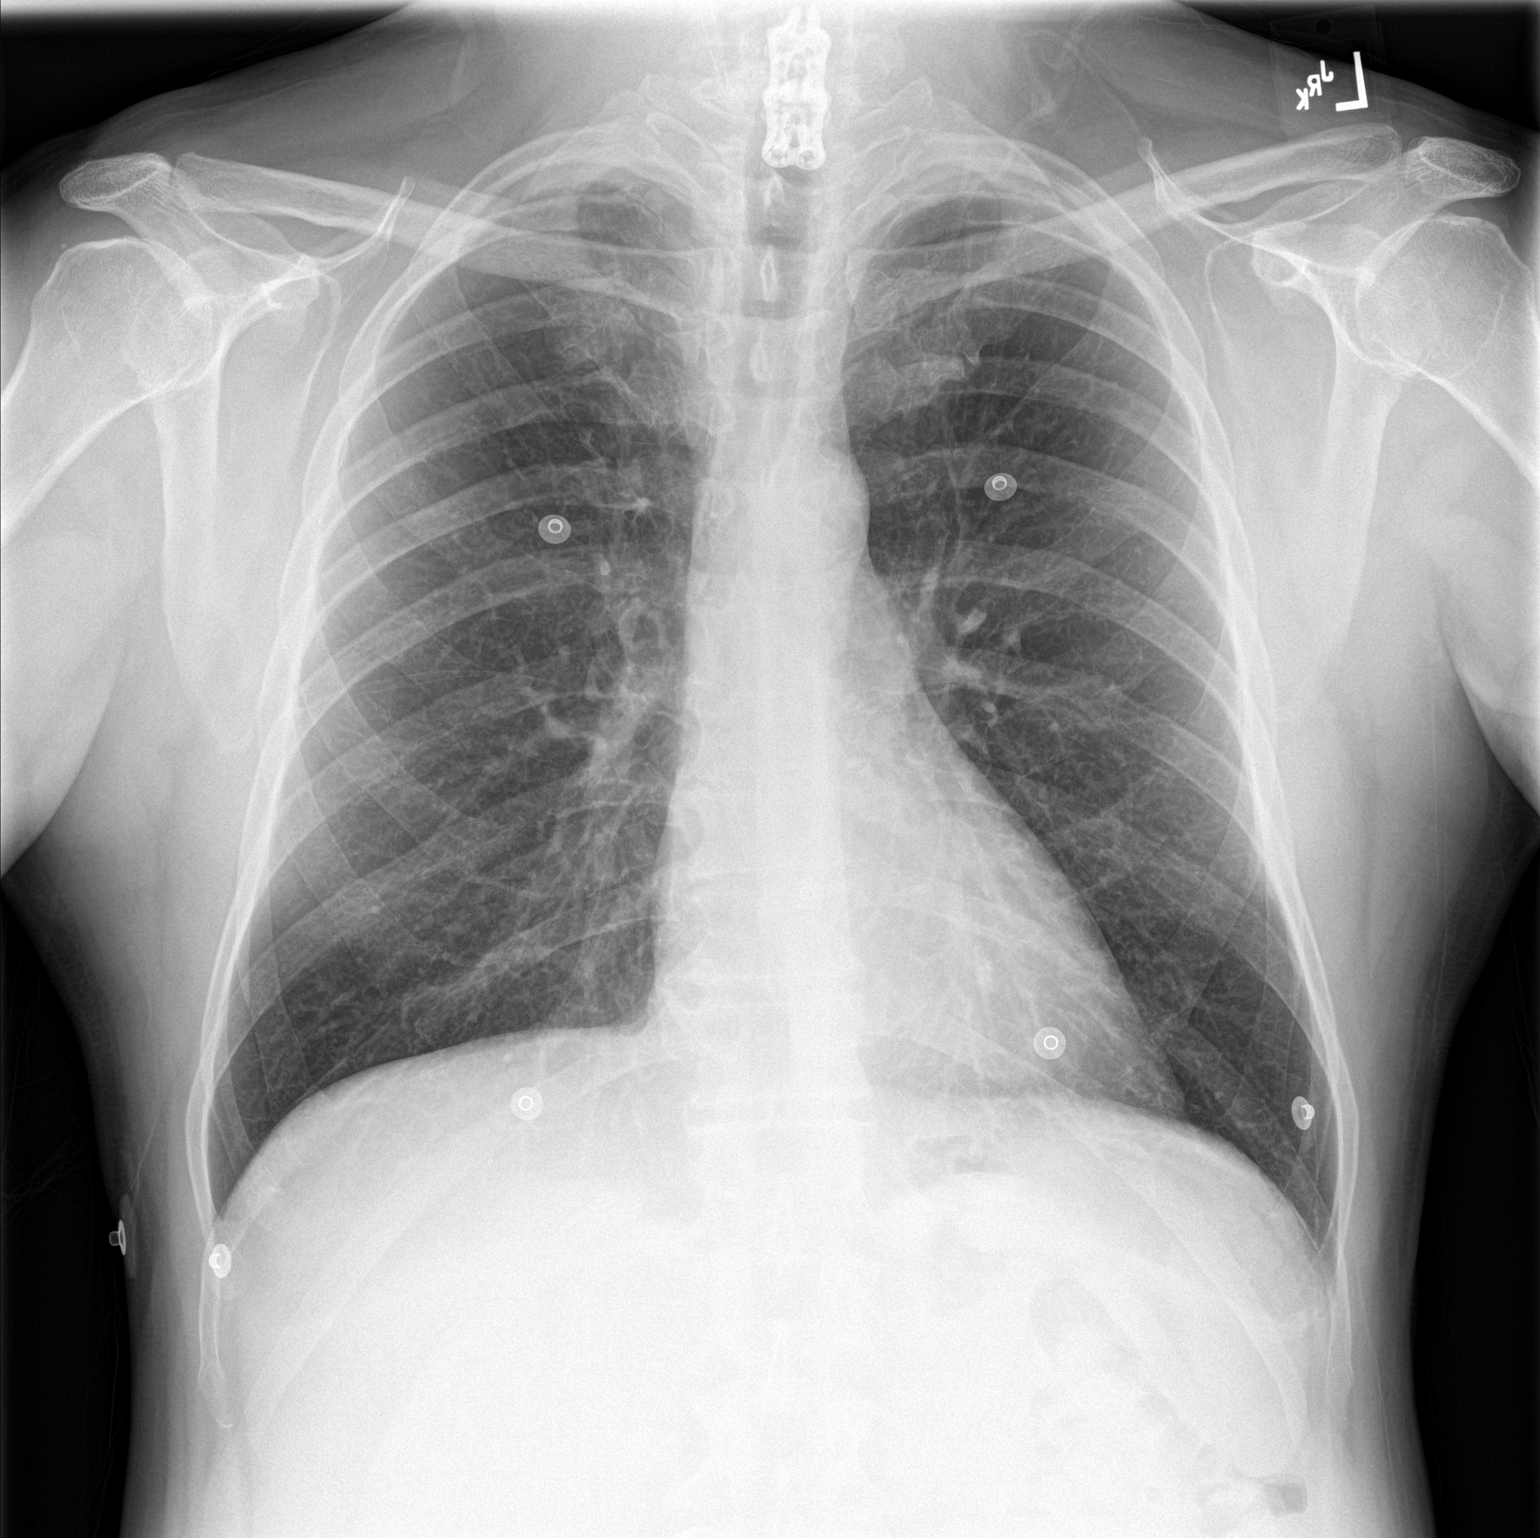
[im 2/2]
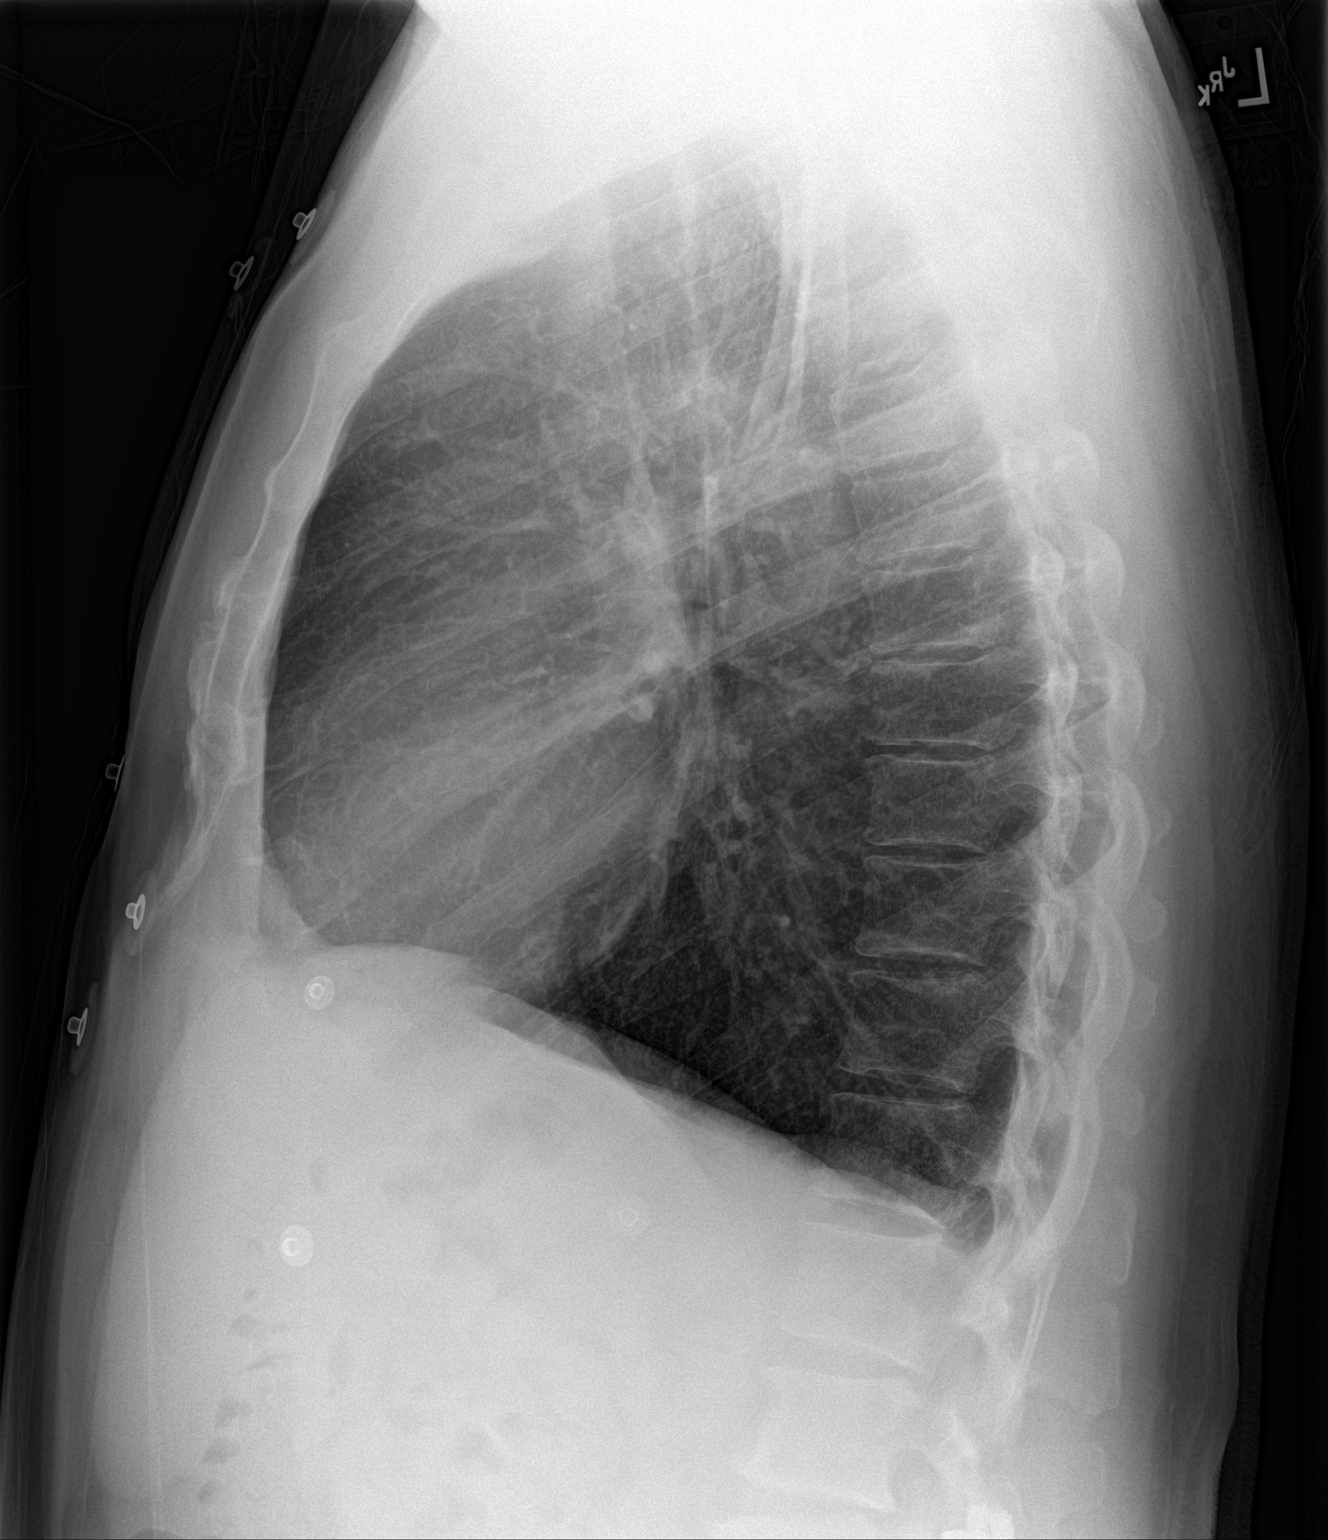

[2 of 2 positions shown; findings below may reference images not displayed]

FINDINGS: Emphysematous changes in the lungs. Normal heart size and pulmonary
vascularity. No focal airspace disease or consolidation in the
lungs. No blunting of costophrenic angles. No pneumothorax.
Mediastinal contours appear intact. Postoperative change in the
cervical spine.
IMPRESSION: Emphysematous changes in the lungs. No evidence of active pulmonary
disease.

## 2017-08-23 IMAGING — DX DG CHEST 1V PORT
1 series · 1 of 1 positions shown · non-contrast
Comparison: Study obtained earlier in the day

CLINICAL DATA: Status post coronary artery bypass grafting.
Hypoxia.

EXAM:
PORTABLE CHEST 1 VIEW

[chest ap]
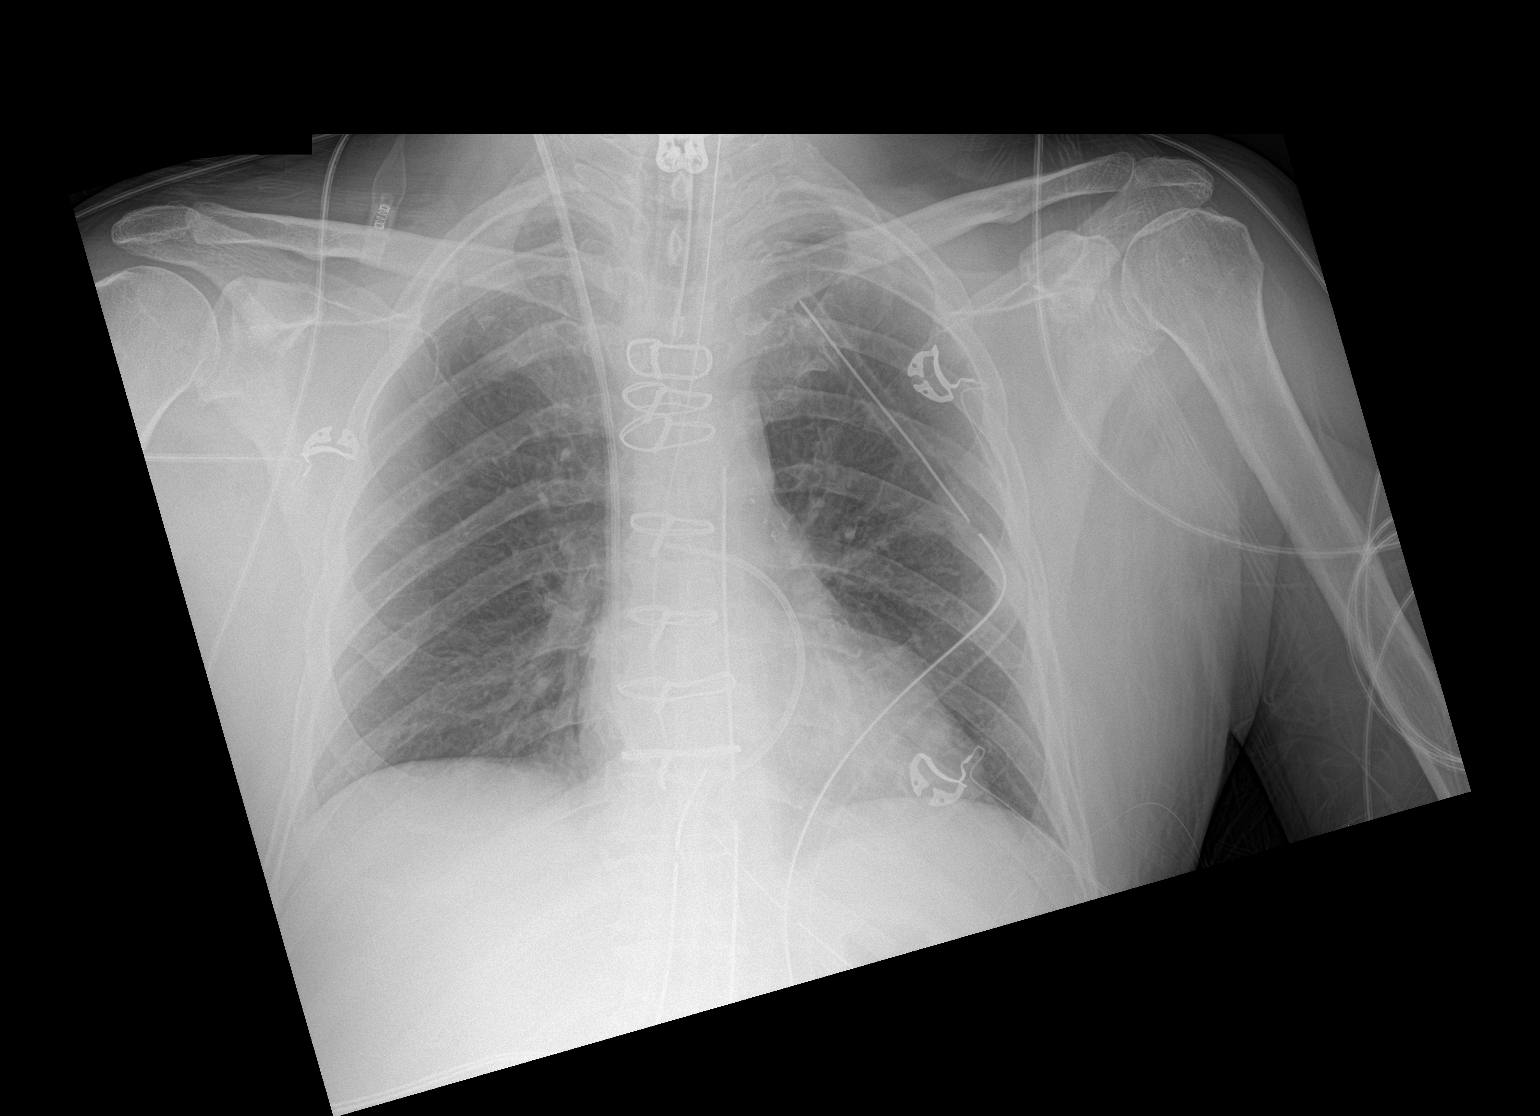

[1 of 1 positions shown; findings below may reference images not displayed]

FINDINGS: Endotracheal tube tip is 3.8 cm above the carina. Swan-Ganz catheter
tip is in the proximal right main pulmonary artery. Nasogastric tube
tip and side port are in the stomach. There is a left chest tube as
well as mediastinal drains. No pneumothorax. There is no edema or
consolidation. Heart size and pulmonary vascularity are normal. No
adenopathy. There is postoperative change in the lower cervical
spine.
IMPRESSION: Tube and catheter positions as described without apparent
pneumothorax. No edema or consolidation.

## 2017-08-25 IMAGING — CR DG CHEST 1V PORT
1 series · 1 of 1 positions shown · non-contrast
Comparison: Chest radiograph 03/01/2016.

CLINICAL DATA: Patient status post CABG procedure.

EXAM:
PORTABLE CHEST 1 VIEW

[AP]
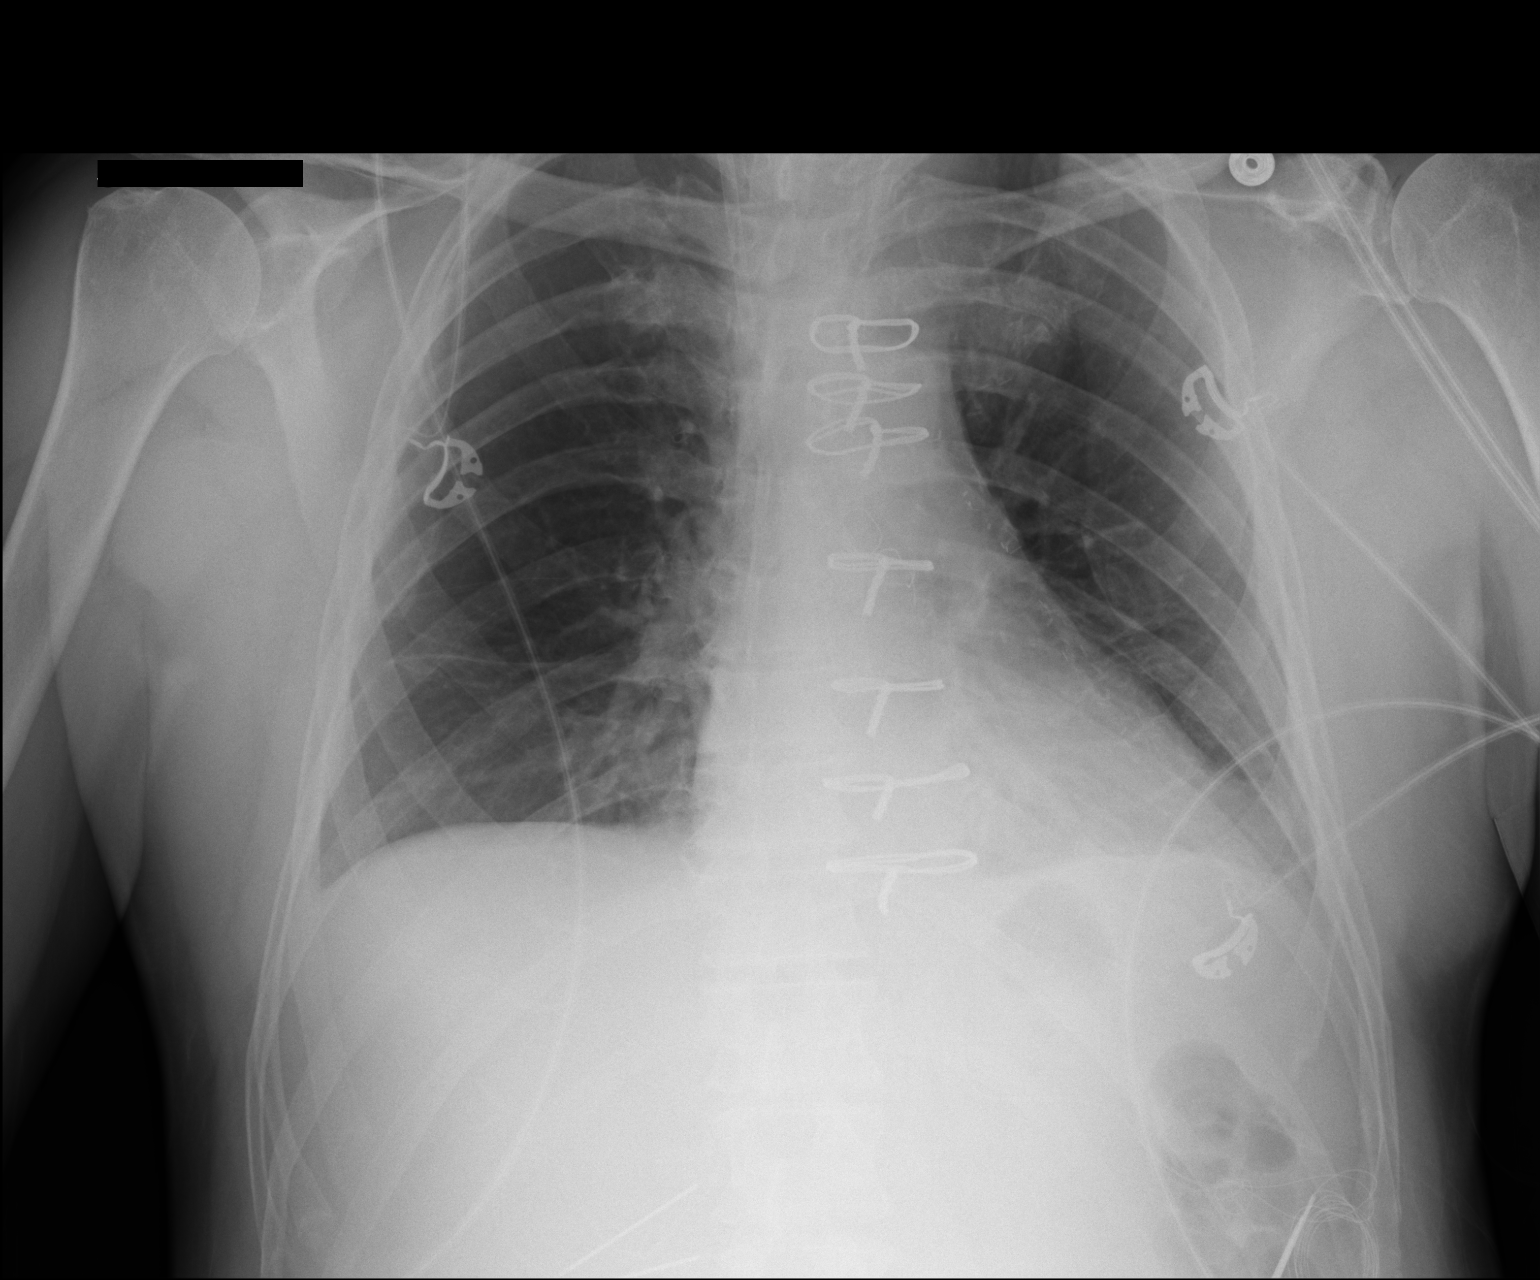

[1 of 1 positions shown; findings below may reference images not displayed]

FINDINGS: Right IJ sheath projects over the superior vena cava. Interval
retraction PA catheter. Interval removal of left chest tube and
mediastinal drain. Monitoring leads overlie the patient. Cervical
spinal fusion hardware. Stable cardiac mediastinal contours. Minimal
heterogeneous opacities left lung base. No pleural effusion or
pneumothorax.
IMPRESSION: Minimal left basilar atelectasis.

## 2017-09-07 ENCOUNTER — Encounter: Payer: Self-pay | Admitting: Emergency Medicine

## 2017-09-07 ENCOUNTER — Other Ambulatory Visit: Payer: Self-pay

## 2017-09-07 ENCOUNTER — Emergency Department
Admission: EM | Admit: 2017-09-07 | Discharge: 2017-09-07 | Disposition: A | Discharge status: Home / Self Care | Payer: Medicare Other | Attending: Emergency Medicine | Admitting: Emergency Medicine

## 2017-09-07 DIAGNOSIS — G8929 Other chronic pain: Secondary | ICD-10-CM | POA: Diagnosis not present

## 2017-09-07 DIAGNOSIS — J029 Acute pharyngitis, unspecified: Secondary | ICD-10-CM

## 2017-09-07 DIAGNOSIS — Z7984 Long term (current) use of oral hypoglycemic drugs: Secondary | ICD-10-CM | POA: Insufficient documentation

## 2017-09-07 DIAGNOSIS — I252 Old myocardial infarction: Secondary | ICD-10-CM | POA: Insufficient documentation

## 2017-09-07 DIAGNOSIS — J028 Acute pharyngitis due to other specified organisms: Secondary | ICD-10-CM | POA: Diagnosis not present

## 2017-09-07 DIAGNOSIS — Z79899 Other long term (current) drug therapy: Secondary | ICD-10-CM | POA: Diagnosis not present

## 2017-09-07 DIAGNOSIS — Z7982 Long term (current) use of aspirin: Secondary | ICD-10-CM | POA: Diagnosis not present

## 2017-09-07 DIAGNOSIS — J069 Acute upper respiratory infection, unspecified: Secondary | ICD-10-CM | POA: Diagnosis not present

## 2017-09-07 DIAGNOSIS — N189 Chronic kidney disease, unspecified: Secondary | ICD-10-CM | POA: Insufficient documentation

## 2017-09-07 DIAGNOSIS — Z951 Presence of aortocoronary bypass graft: Secondary | ICD-10-CM | POA: Diagnosis not present

## 2017-09-07 DIAGNOSIS — I129 Hypertensive chronic kidney disease with stage 1 through stage 4 chronic kidney disease, or unspecified chronic kidney disease: Secondary | ICD-10-CM | POA: Diagnosis not present

## 2017-09-07 DIAGNOSIS — Z87891 Personal history of nicotine dependence: Secondary | ICD-10-CM | POA: Diagnosis not present

## 2017-09-07 MED ORDER — PSEUDOEPH-BROMPHEN-DM 30-2-10 MG/5ML PO SYRP: 5.0000 mL | ORAL_SOLUTION | Freq: Four times a day (QID) | ORAL | 0 refills | Status: DC | PRN

## 2017-09-07 NOTE — Discharge Instructions (Signed)
[facility] if any continued problems. Increase fluids. Tylenol or ibuprofen as needed for throat pain. Begin taking Bromfed-DM as needed for cough and congestion.

## 2017-09-07 NOTE — ED Triage Notes (Addendum)
Pt c/o sore throat for 2 days. Does report a cough for 2 days as well and thinks throat may be sore from cough. Ambulatory to check in . NAD.  respiration unlabored. + Odynophagia.

## 2017-09-07 NOTE — ED Provider Notes (Signed)
[facility] Emergency Department Provider Note  ____________________________________________   First MD Initiated Contact with Patient 09/07/17 0900     (approximate)  I have reviewed the triage vital signs and the nursing notes.   HISTORY  Chief Complaint Sore Throat   HPI MICO SPARK is a 61 y.o. male is here complaining of sore throat for 2 days. Patient also has a cough as well. He is unaware of any fever and denies chills. He has not taken any over-the-counter medication for his congestion or cough.patient rates his discomfort as 9 out of 10.   Past Medical History:  Diagnosis Date  . Alcohol abuse   . Arthritis   . Chronic back pain   . Chronic kidney disease   . Complication of anesthesia    had difficulty with breathing, had difficulty waking up  . Gonorrhea   . Headache(784.0)   . Hypercholesteremia    no medication at this time  . Hypertension   . Trigger finger    on both hands  . Ulcer    on medication    Patient Active Problem List   Diagnosis Date Noted  . 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 Non-ST elevated myocardial infarction (non-STEMI) ([place]) 02/27/2016  . NSTEMI (non-ST elevated myocardial infarction) ([place]) 02/26/2016    Past Surgical History:  Procedure Laterality Date  . ANTERIOR LAT LUMBAR FUSION  08/28/2011   Procedure: ANTERIOR LATERAL LUMBAR FUSION 1 LEVEL;  Surgeon: Dahlia Bailiff;  Location: [place];  Service: Orthopedics;  Laterality: N/A;  Lateral L3-4 Fusion Posterior Spinal Fusion L3-4, Possible Removal of Hardware   . CARDIAC CATHETERIZATION N/A 02/27/2016   Procedure: Left Heart Cath and Coronary Angiography;  Surgeon: Corey Skains, MD;  Location: [place] CV LAB;  Service: Cardiovascular;  Laterality: N/A;  . CERVICAL DISC SURGERY     3 ruptured disc, 2012  . CORONARY ARTERY BYPASS GRAFT N/A 02/29/2016   Procedure: CORONARY ARTERY BYPASS GRAFTING (CABG), ON PUMP, TIMES THREE, USING LEFT INTERNAL  MAMMARY ARTERY, RIGHT GREATER SAPHENOUS VEIN HARVESTED ENDOSCOPICALLY;  Surgeon: Gaye Pollack, MD;  Location: [place];  Service: Open Heart Surgery;  Laterality: N/A;  LIMA to LAD, SVG to RAMUS INTERMEDIATE, SVG to OM  . ESOPHAGOGASTRODUODENOSCOPY (EGD) WITH PROPOFOL N/A 11/23/2015   Procedure: ESOPHAGOGASTRODUODENOSCOPY (EGD) WITH PROPOFOL;  Surgeon: Lollie Sails, MD;  Location: [facility] ENDOSCOPY;  Service: Endoscopy;  Laterality: N/A;  . KNEE SURGERY     left  . LUMBAR FUSION     2008  . TEE WITHOUT CARDIOVERSION N/A 02/29/2016   Procedure: TRANSESOPHAGEAL ECHOCARDIOGRAM (TEE);  Surgeon: Gaye Pollack, MD;  Location: [place];  Service: Open Heart Surgery;  Laterality: N/A;  . [place]    Prior to Admission medications   Medication Sig Start Date End Date Taking? Authorizing Provider  aspirin 81 MG tablet Take 81 mg by mouth daily.    [provider]  blood glucose meter kit and supplies Dispense based on patient and insurance preference. Use up to four times daily as directed. (FOR ICD-9 250.00, 250.01). 03/04/16   Barrett, Erin R, PA-C  brompheniramine-pseudoephedrine-DM 30-2-10 MG/5ML syrup Take 5 mLs by mouth 4 (four) times daily as needed. 09/07/17   Johnn Hai, PA-C  gemfibrozil (LOPID) 600 MG tablet Take 600 mg by mouth 2 (two) times daily before a meal.    [provider]  lisinopril (ZESTRIL) 2.5 MG tablet Take 1 tablet (2.5 mg total) by  mouth daily. 03/04/16   Barrett, Erin R, PA-C  metFORMIN (GLUCOPHAGE) 850 MG tablet Take 1 tablet (850 mg total) by mouth 2 (two) times daily with a meal. Patient not taking: Reported on 04/02/2016 03/04/16   Barrett, Junie Panning R, PA-C  metoprolol (LOPRESSOR) 50 MG tablet Take 1 tablet (50 mg total) by mouth 2 (two) times daily. 03/04/16   Barrett, Erin R, PA-C  omeprazole (PRILOSEC) 20 MG capsule Take 20 mg by mouth daily.     [provider]    Allergies Vicodin [hydrocodone-acetaminophen] and Milk-related  compounds  Family History  Problem Relation Age of Onset  . CAD Father   . Stomach cancer Maternal Grandmother     Social History Social History   Tobacco Use  . Smoking status: Former Smoker    Types: Cigarettes  . Tobacco comment: stopped at age 68 years  Substance Use Topics  . Alcohol use: No    Comment: occassional  . Drug use: No    Review of Systems Constitutional: No fever/chills Eyes: No visual changes. ENT: positive sore throat. Cardiovascular: Denies chest pain. Respiratory: Denies shortness of breath. Positive productive cough. Gastrointestinal: No abdominal pain.  No nausea, no vomiting.  No diarrhea.  Musculoskeletal: Negative for muscle aches. Skin: Negative for rash. Neurological: Negative for headaches, focal weakness or numbness. ____________________________________________   PHYSICAL EXAM:  VITAL SIGNS: ED Triage Vitals  Enc Vitals Group     BP 09/07/17 0833 (!) 164/81     Pulse Rate 09/07/17 0833 87     Resp 09/07/17 0833 18     Temp 09/07/17 0833 98.7 F (37.1 C)     Temp Source 09/07/17 0833 Oral     SpO2 09/07/17 0833 99 %     Weight 09/07/17 0829 180 lb (81.6 kg)     Height 09/07/17 0829 '5\' 5"'$  (1.651 m)     Head Circumference --      Peak Flow --      Pain Score 09/07/17 0829 9     Pain Loc --      Pain Edu? --      Excl. in [place]? --    Constitutional: Alert and oriented. Well appearing and in no acute distress. Eyes: Conjunctivae are normal.  Head: Atraumatic. Nose: No congestion/rhinnorhea.  TMs are dull bilaterally. Mouth/Throat: Mucous membranes are moist.  Oropharynx non-erythematous. No exudate noted. Neck: No stridor.   Hematological/Lymphatic/Immunilogical: No cervical lymphadenopathy. Cardiovascular: Normal rate, regular rhythm. Grossly normal heart sounds.  Good peripheral circulation. Respiratory: Normal respiratory effort.  No retractions. Lungs CTAB. Musculoskeletal: moves upper and lower extremities without any  difficulty and normal gait was noted. Neurologic:  Normal speech and language. No gross focal neurologic deficits are appreciated.  Skin:  Skin is warm, dry and intact.  Psychiatric: Mood and affect are normal. Speech and behavior are normal.  ____________________________________________   LABS (all labs ordered are listed, but only abnormal results are displayed)  Labs Reviewed - No data to display  PROCEDURES  Procedure(s) performed: None  Procedures  Critical Care performed: No  ____________________________________________   INITIAL IMPRESSION / ASSESSMENT AND PLAN / ED COURSE Patient was reassured that most likely this is a viral illness. He was given a prescription for Bromfed-DM for cough and congestion. He is encouraged to drink fluids and take Tylenol or ibuprofen as needed for his throat pain. He'll follow-up with [facility]  clinic if any continued problems.  ____________________________________________   FINAL CLINICAL IMPRESSION(S) / ED DIAGNOSES  Final diagnoses:  Viral pharyngitis  Acute upper respiratory infection     ED Discharge Orders        Ordered    brompheniramine-pseudoephedrine-DM 30-2-10 MG/5ML syrup  4 times daily PRN     09/07/17 0916       Note:  This document was prepared using Dragon voice recognition software and may include unintentional dictation errors.    Johnn Hai, PA-C 09/07/17 Home, Randall An, MD 09/07/17 [phone]

## 2017-09-07 NOTE — ED Notes (Signed)
See triage note states he developed sore throat and some body aches about 2 -3 days ago  Increased pain with swallowing unsure of fever but afebrile on arrival

## 2017-09-18 ENCOUNTER — Other Ambulatory Visit: Payer: Self-pay | Admitting: Nurse Practitioner

## 2017-09-18 DIAGNOSIS — J029 Acute pharyngitis, unspecified: Secondary | ICD-10-CM

## 2017-09-18 MED ORDER — AZITHROMYCIN 250 MG PO TABS: ORAL_TABLET | ORAL | 0 refills | Status: DC

## 2017-09-18 NOTE — Progress Notes (Signed)
[facility]. He should use cough syrup given by er to help cough/congestion.

## 2017-09-25 IMAGING — CR DG CHEST 2V
2 series · 2 of 2 positions shown · non-contrast
Comparison: Portable chest x-ray March 02, 2016

CLINICAL DATA: Follow-up from CABG on February 29, 2016; no current
complaints

EXAM:
CHEST  2 VIEW

[w chest pa]
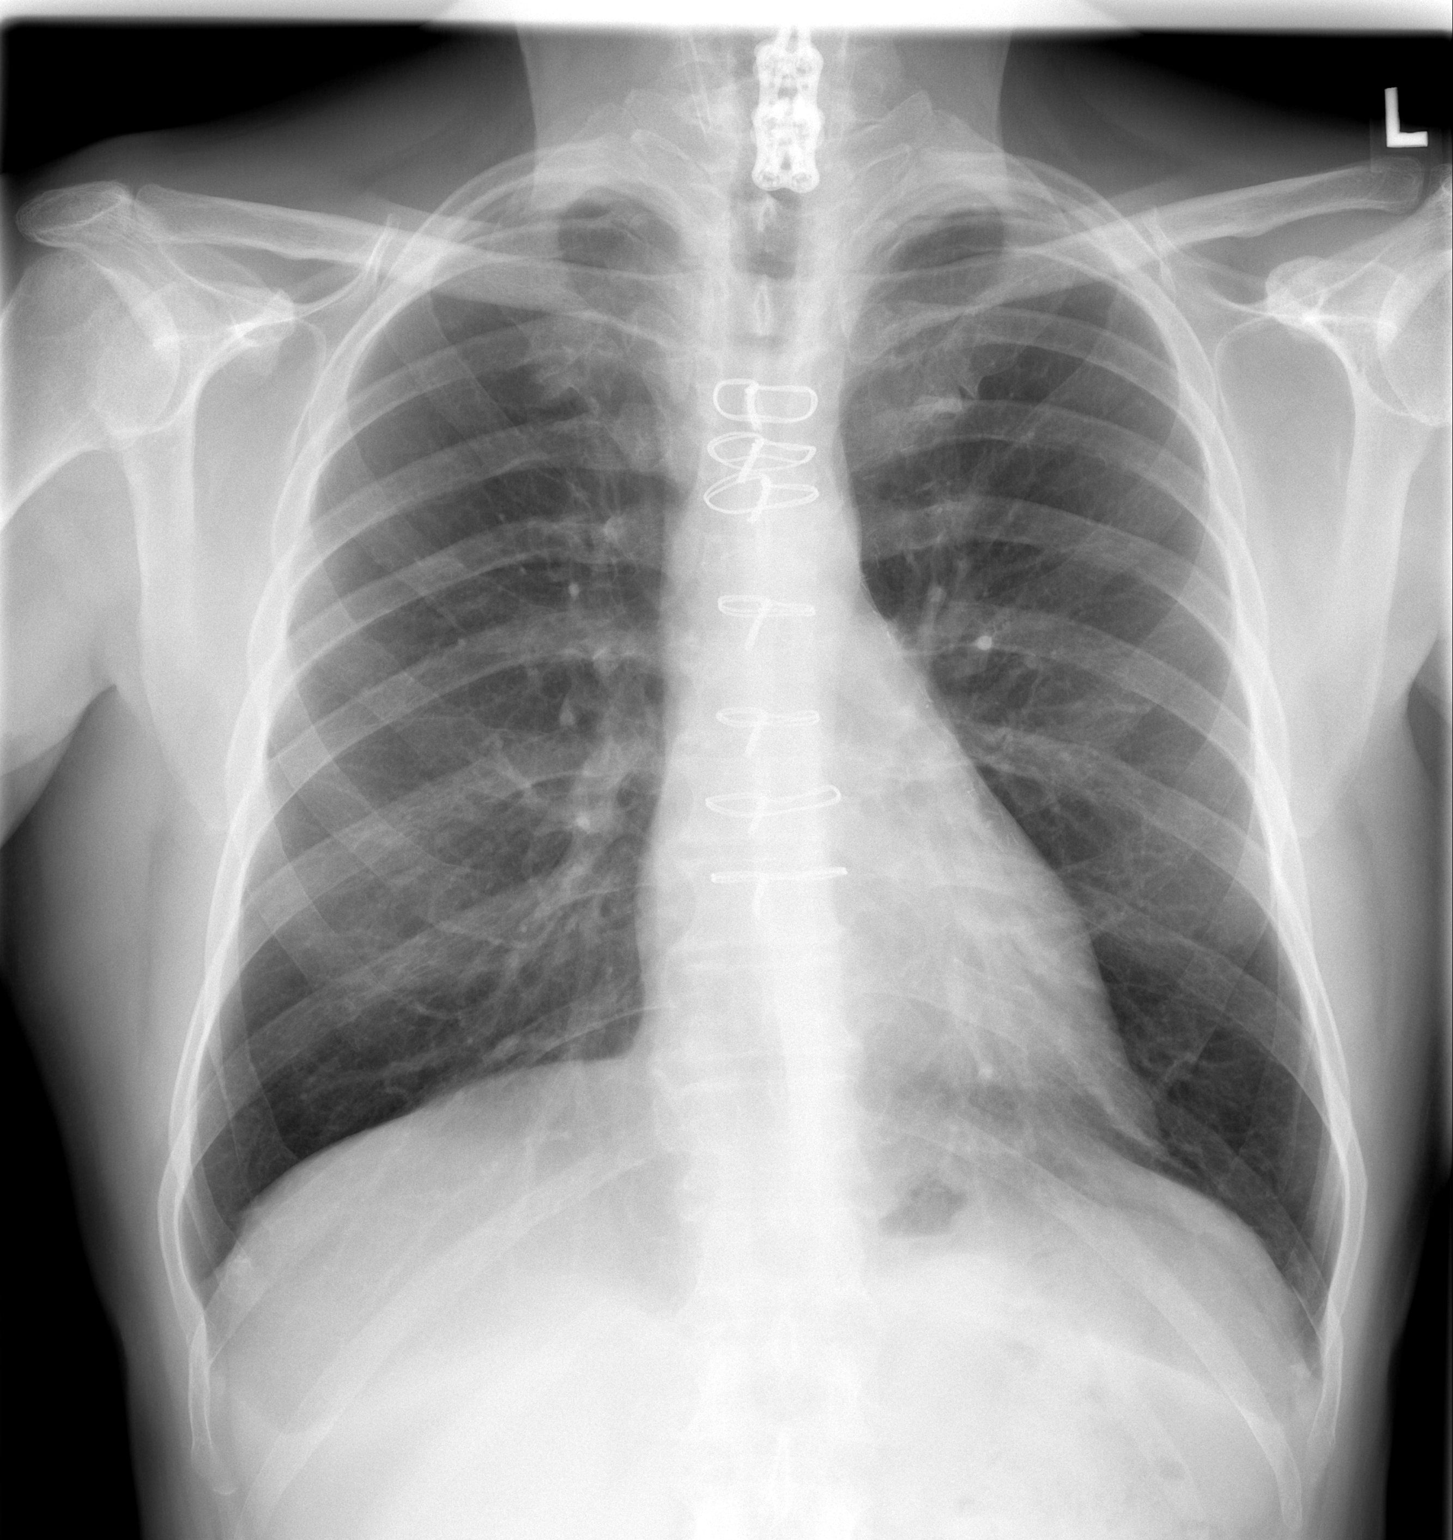

[w chest lat]
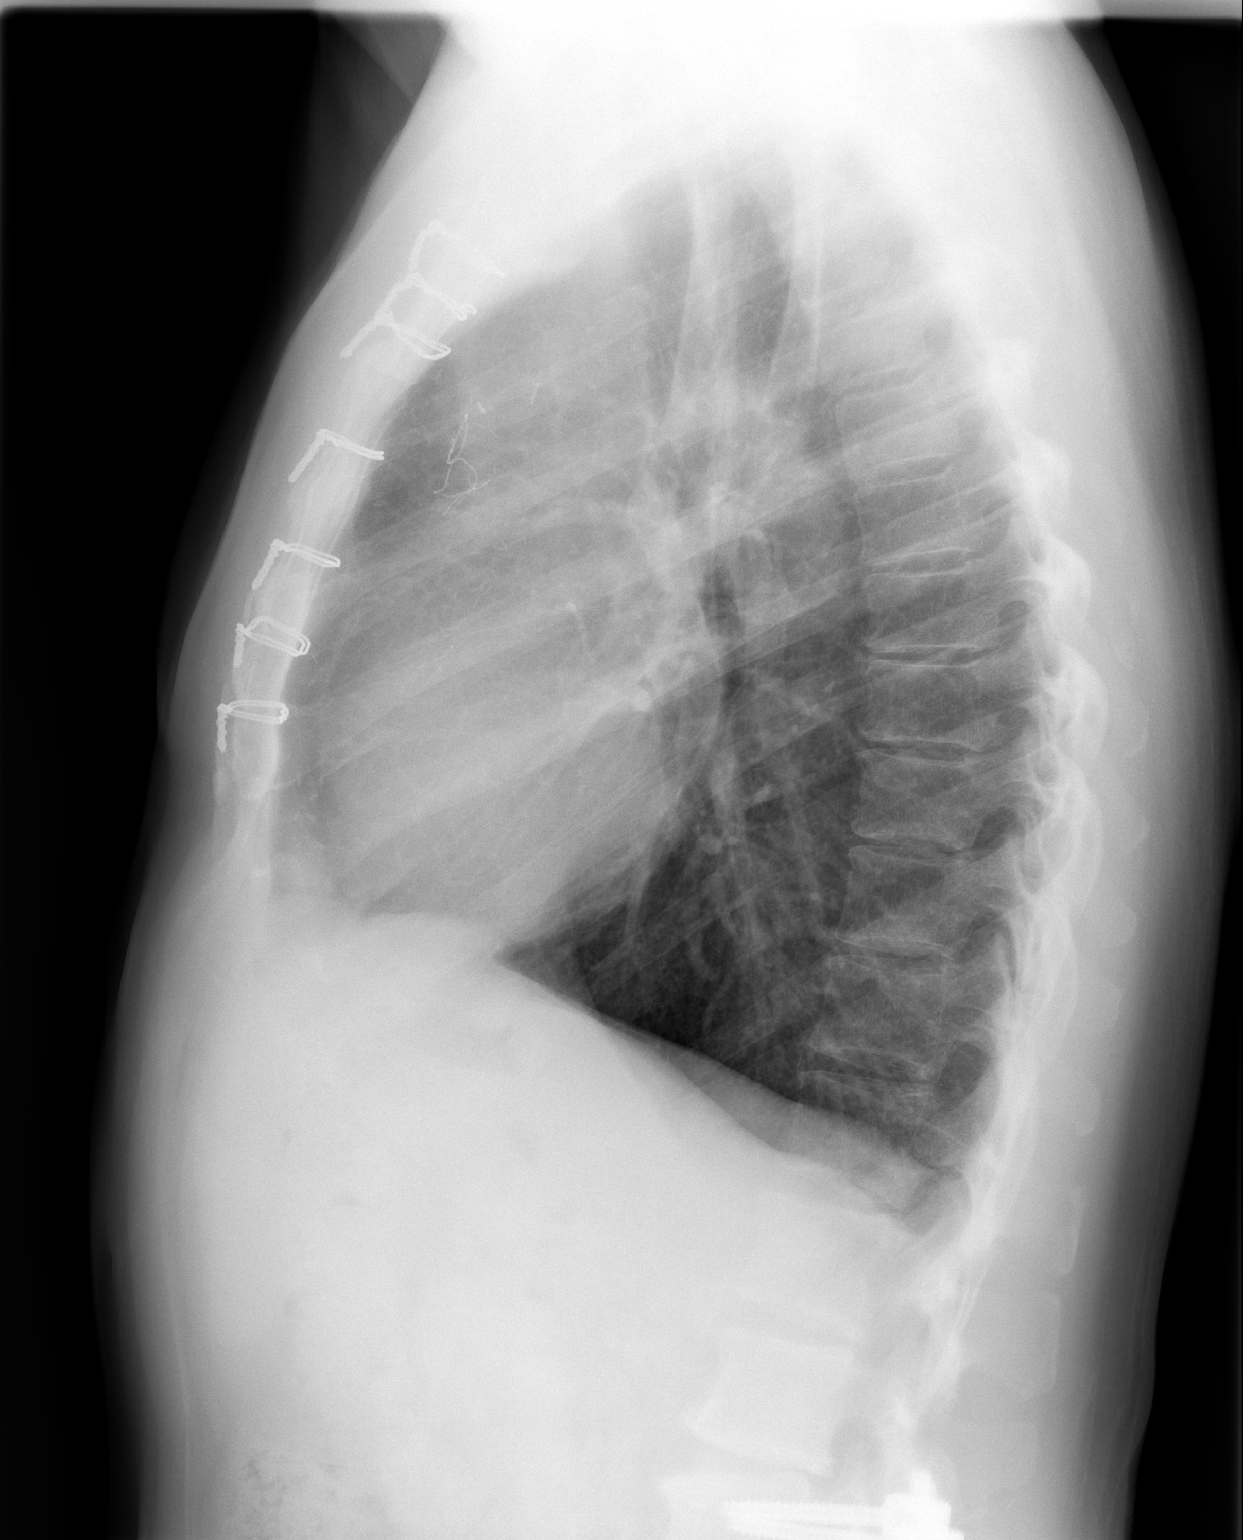

[2 of 2 positions shown; findings below may reference images not displayed]

FINDINGS: The lungs are mildly hyperinflated with hemidiaphragm flattening.
There is no infiltrate, pleural effusion, or significant scarring.
The heart and pulmonary vascularity are normal. The mediastinum is
normal in width. The sternal wires are intact. The retrosternal soft
tissues are normal.
IMPRESSION: Mild hyperinflation which may reflect chronic bronchitis. Post CABG
changes. No pneumonia, CHF, nor other acute cardiopulmonary
abnormality.

## 2017-10-08 ENCOUNTER — Encounter: Payer: Self-pay | Admitting: Nurse Practitioner

## 2017-10-16 ENCOUNTER — Other Ambulatory Visit: Payer: Self-pay | Admitting: Nurse Practitioner

## 2017-10-16 ENCOUNTER — Ambulatory Visit: Payer: Self-pay | Admitting: Internal Medicine

## 2017-10-16 ENCOUNTER — Other Ambulatory Visit: Payer: Self-pay

## 2017-10-16 DIAGNOSIS — R05 Cough: Secondary | ICD-10-CM

## 2017-10-16 DIAGNOSIS — J029 Acute pharyngitis, unspecified: Secondary | ICD-10-CM

## 2017-10-16 DIAGNOSIS — R059 Cough, unspecified: Secondary | ICD-10-CM

## 2017-10-16 MED ORDER — AZITHROMYCIN 250 MG PO TABS: ORAL_TABLET | ORAL | 0 refills | Status: DC

## 2017-10-16 MED ORDER — MELOXICAM 7.5 MG PO TABS: 7.5000 mg | ORAL_TABLET | Freq: Two times a day (BID) | ORAL | 3 refills | Status: DC

## 2017-10-16 MED ORDER — PSEUDOEPH-BROMPHEN-DM 30-2-10 MG/5ML PO SYRP: 5.0000 mL | ORAL_SOLUTION | Freq: Four times a day (QID) | ORAL | 0 refills | Status: DC | PRN

## 2017-12-03 ENCOUNTER — Encounter: Payer: Self-pay | Admitting: Nurse Practitioner

## 2017-12-03 ENCOUNTER — Ambulatory Visit (INDEPENDENT_AMBULATORY_CARE_PROVIDER_SITE_OTHER): Payer: Medicare Other | Admitting: Nurse Practitioner

## 2017-12-03 VITALS — BP 156/80 | HR 81 | Resp 16 | Ht 65.0 in | Wt 184.0 lb

## 2017-12-03 DIAGNOSIS — Z0001 Encounter for general adult medical examination with abnormal findings: Secondary | ICD-10-CM | POA: Diagnosis not present

## 2017-12-03 DIAGNOSIS — R300 Dysuria: Secondary | ICD-10-CM | POA: Diagnosis not present

## 2017-12-03 DIAGNOSIS — R7301 Impaired fasting glucose: Secondary | ICD-10-CM | POA: Diagnosis not present

## 2017-12-03 DIAGNOSIS — F411 Generalized anxiety disorder: Secondary | ICD-10-CM

## 2017-12-03 DIAGNOSIS — E782 Mixed hyperlipidemia: Secondary | ICD-10-CM

## 2017-12-03 DIAGNOSIS — Z1211 Encounter for screening for malignant neoplasm of colon: Secondary | ICD-10-CM | POA: Diagnosis not present

## 2017-12-03 DIAGNOSIS — I10 Essential (primary) hypertension: Secondary | ICD-10-CM | POA: Diagnosis not present

## 2017-12-03 DIAGNOSIS — Z125 Encounter for screening for malignant neoplasm of prostate: Secondary | ICD-10-CM

## 2017-12-03 MED ORDER — ESCITALOPRAM OXALATE 10 MG PO TABS
10.0000 mg | ORAL_TABLET | Freq: Every day | ORAL | 4 refills | Status: DC
Start: 2017-12-03 — End: 2023-07-17

## 2017-12-03 NOTE — Progress Notes (Signed)
[facility] [place], [place] [address]  Internal MEDICINE  Office Visit Note  Patient Name: Tyler Russell  [id number]  [id number]  Date of Service: 12/24/2017  Chief Complaint  Patient presents with  . Annual Exam     Diabetes  He presents for his follow-up diabetic visit. He has type 2 diabetes mellitus. No MedicAlert identification noted. His disease course has been stable. Hypoglycemia symptoms include headaches and nervousness/anxiousness. Pertinent negatives for hypoglycemia include no tremors. Associated symptoms include polydipsia and polyuria. Pertinent negatives for diabetes include no chest pain and no fatigue. There are no hypoglycemic complications. Symptoms are stable. Diabetic complications include peripheral neuropathy. Risk factors for coronary artery disease include dyslipidemia, hypertension, male sex, diabetes mellitus and family history. Current diabetic treatment includes oral agent (monotherapy). He is compliant with treatment most of the time. His weight is stable. He is following a generally healthy diet. Meal planning includes avoidance of concentrated sweets. He has not had a previous visit with a dietitian. He participates in exercise intermittently. There is no change in his home blood glucose trend. An ACE inhibitor/angiotensin II receptor blocker is being taken. He does not see a podiatrist.Eye exam is current.   Pt is here for routine health maintenance examination  Current Medication: Outpatient Encounter Medications as of 12/03/2017  Medication Sig  . aspirin 81 MG tablet Take 81 mg by mouth daily.  Marland Kitchen escitalopram (LEXAPRO) 10 MG tablet Take 1 tablet (10 mg total) by mouth daily.  Marland Kitchen gemfibrozil (LOPID) 600 MG tablet Take 600 mg by mouth 2 (two) times daily before a meal.  . meloxicam (MOBIC) 7.5 MG tablet Take 1 tablet (7.5 mg total) by mouth 2 (two) times daily.  . metoprolol (LOPRESSOR) 50 MG tablet Take 1 tablet (50 mg total)  by mouth 2 (two) times daily.  Marland Kitchen omeprazole (PRILOSEC) 20 MG capsule Take 20 mg by mouth daily.   . [DISCONTINUED] escitalopram (LEXAPRO) 10 MG tablet Take 10 mg by mouth daily.  . blood glucose meter kit and supplies Dispense based on patient and insurance preference. Use up to four times daily as directed. (FOR ICD-9 250.00, 250.01).  . brompheniramine-pseudoephedrine-DM 30-2-10 MG/5ML syrup Take 5 mLs by mouth 4 (four) times daily as needed.  Marland Kitchen lisinopril (ZESTRIL) 2.5 MG tablet Take 1 tablet (2.5 mg total) by mouth daily.  . metFORMIN (GLUCOPHAGE) 850 MG tablet Take 1 tablet (850 mg total) by mouth 2 (two) times daily with a meal. (Patient not taking: Reported on 04/02/2016)  . [DISCONTINUED] azithromycin (ZITHROMAX) 250 MG tablet Take by mouth as directed for 5 days (Patient not taking: Reported on 12/03/2017)   No facility-administered encounter medications on file as of 12/03/2017.     Surgical History: Past Surgical History:  Procedure Laterality Date  . ANTERIOR LAT LUMBAR FUSION  08/28/2011   Procedure: ANTERIOR LATERAL LUMBAR FUSION 1 LEVEL;  Surgeon: Dahlia Bailiff;  Location: [place];  Service: Orthopedics;  Laterality: N/A;  Lateral L3-4 Fusion Posterior Spinal Fusion L3-4, Possible Removal of Hardware   . CARDIAC CATHETERIZATION N/A 02/27/2016   Procedure: Left Heart Cath and Coronary Angiography;  Surgeon: Corey Skains, MD;  Location: [place] CV LAB;  Service: Cardiovascular;  Laterality: N/A;  . CERVICAL DISC SURGERY     3 ruptured disc, 2012  . CORONARY ARTERY BYPASS GRAFT N/A 02/29/2016   Procedure: CORONARY ARTERY BYPASS GRAFTING (CABG), ON PUMP, TIMES THREE, USING LEFT INTERNAL MAMMARY ARTERY, RIGHT GREATER SAPHENOUS VEIN HARVESTED ENDOSCOPICALLY;  Surgeon: Gaye Pollack, MD;  Location: [place];  Service: Open Heart Surgery;  Laterality: N/A;  LIMA to LAD, SVG to RAMUS INTERMEDIATE, SVG to OM  . ESOPHAGOGASTRODUODENOSCOPY (EGD) WITH PROPOFOL N/A 11/23/2015   Procedure:  ESOPHAGOGASTRODUODENOSCOPY (EGD) WITH PROPOFOL;  Surgeon: Lollie Sails, MD;  Location: [facility];  Service: Endoscopy;  Laterality: N/A;  . KNEE SURGERY     left  . LUMBAR FUSION     2008  . TEE WITHOUT CARDIOVERSION N/A 02/29/2016   Procedure: TRANSESOPHAGEAL ECHOCARDIOGRAM (TEE);  Surgeon: Gaye Pollack, MD;  Location: [place];  Service: Open Heart Surgery;  Laterality: N/A;  . VASECTOMY  1983    Medical History: Past Medical History:  Diagnosis Date  . Alcohol abuse   . Arthritis   . Chronic back pain   . Chronic kidney disease   . Complication of anesthesia    had difficulty with breathing, had difficulty waking up  . Gonorrhea   . Headache(784.0)   . Hypercholesteremia    no medication at this time  . Hypertension   . Trigger finger    on both hands  . Ulcer    on medication    Family History: Family History  Problem Relation Age of Onset  . CAD Father   . Stomach cancer Maternal Grandmother       Review of Systems  Constitutional: Negative for activity change, chills, fatigue and unexpected weight change.  HENT: Negative for congestion, postnasal drip, rhinorrhea, sneezing and sore throat.   Eyes: Negative.  Negative for redness.  Respiratory: Positive for shortness of breath. Negative for cough, chest tightness and wheezing.   Cardiovascular: Negative for chest pain and palpitations.  Gastrointestinal: Negative for abdominal pain, constipation, diarrhea, nausea and vomiting.  Endocrine: Positive for polydipsia and polyuria.  Genitourinary: Negative for dysuria and frequency.  Musculoskeletal: Positive for back pain and neck pain. Negative for arthralgias and joint swelling.  Skin: Negative for rash.  Allergic/Immunologic: Negative for environmental allergies.  Neurological: Positive for headaches. Negative for tremors and numbness.  Hematological: Negative for adenopathy. Does not bruise/bleed easily.  Psychiatric/Behavioral: Positive for dysphoric  mood. Negative for behavioral problems (Depression), sleep disturbance and suicidal ideas. The patient is nervous/anxious.      Today's Vitals   12/03/17 1447  BP: (!) 156/80  Pulse: 81  Resp: 16  SpO2: 97%  Weight: 184 lb (83.5 kg)  Height: '5\' 5"'$  (1.651 m)    Physical Exam  Constitutional: He is oriented to person, place, and time. He appears well-developed and well-nourished. No distress.  HENT:  Head: Normocephalic and atraumatic.  Mouth/Throat: Oropharynx is clear and moist. No oropharyngeal exudate.  Eyes: Pupils are equal, round, and reactive to light. EOM are normal.  Neck: Normal range of motion. Neck supple. No JVD present. Carotid bruit is not present. No tracheal deviation present. No thyromegaly present.  Cardiovascular: Normal rate, regular rhythm, normal heart sounds and intact distal pulses. Exam reveals no gallop and no friction rub.  No murmur heard. Pulmonary/Chest: Effort normal and breath sounds normal. No respiratory distress. He has no wheezes. He has no rales. He exhibits no tenderness.  Abdominal: Soft. Bowel sounds are normal. There is no tenderness.  Musculoskeletal: Normal range of motion.  Lymphadenopathy:    He has no cervical adenopathy.  Neurological: He is alert and oriented to person, place, and time. No cranial nerve deficit.  Skin: Skin is warm and dry. He is not diaphoretic.  Psychiatric: He has a normal  mood and affect. His behavior is normal. Judgment and thought content normal.  Nursing note and vitals reviewed.   LABS: Recent Results (from the past 2160 hour(s))  Urinalysis, Routine w reflex microscopic     Status: None   Collection Time: 12/03/17  2:45 PM  Result Value Ref Range   Specific Gravity, UA 1.016 1.005 - 1.030   pH, UA 6.0 5.0 - 7.5   Color, UA Yellow Yellow   Appearance Ur Clear Clear   Leukocytes, UA Negative Negative   Protein, UA Negative Negative/Trace   Glucose, UA Negative Negative   Ketones, UA Negative Negative    RBC, UA Negative Negative   Bilirubin, UA Negative Negative   Urobilinogen, Ur 0.2 0.2 - 1.0 mg/dL   Nitrite, UA Negative Negative   Microscopic Examination Comment     Comment: Microscopic not indicated and not performed.     Assessment/Plan: 1. Encounter for general adult medical examination with abnormal findings Annual health maintenance exam today  2. Essential hypertension Continue bp medication as prescribed. Check routine labs - CBC with Differential/Platelet - Comprehensive metabolic panel - TSH  3. Mixed hyperlipidemia Check fasting lipid panel and treat as indicated - Lipid panel - TSH  4. Generalized anxiety disorder Stable. Continue lexapro as prescribed  - escitalopram (LEXAPRO) 10 MG tablet; Take 1 tablet (10 mg total) by mouth daily.  Dispense: 90 tablet; Refill: 4  5. Impaired fasting glucose - HgB A1c  6. Screening for prostate cancer - PSA  7. Screening for colon cancer - Fecal occult blood, imunochemical  8. Dysuria - Urinalysis, Routine w reflex microscopic  General Counseling: Adolph verbalizes understanding of the findings of todays visit and agrees with plan of treatment. I have discussed any further diagnostic evaluation that may be needed or ordered today. We also reviewed his medications today. he has been encouraged to call the office with any questions or concerns that should arise related to todays visit.   Hypertension Counseling:   The following hypertensive lifestyle modification were recommended and discussed:  1. Limiting alcohol intake to less than 1 oz/day of ethanol:(24 oz of beer or 8 oz of wine or 2 oz of 100-proof whiskey). 2. Take baby ASA 81 mg daily. 3. Importance of regular aerobic exercise and losing weight. 4. Reduce dietary saturated fat and cholesterol intake for overall cardiovascular health. 5. Maintaining adequate dietary potassium, calcium, and magnesium intake. 6. Regular monitoring of the blood pressure. 7.  Reduce sodium intake to less than 100 mmol/day (less than 2.3 gm of sodium or less than 6 gm of sodium choride)   This patient was seen by Leretha Pol, FNP- C in Collaboration with Dr Lavera Guise as a part of collaborative care agreement   Orders Placed This Encounter  Procedures  . Fecal occult blood, imunochemical  . Urinalysis, Routine w reflex microscopic  . PSA  . CBC with Differential/Platelet  . Comprehensive metabolic panel  . Lipid panel  . TSH  . HgB A1c    Meds ordered this encounter  Medications  . escitalopram (LEXAPRO) 10 MG tablet    Sig: Take 1 tablet (10 mg total) by mouth daily.    Dispense:  90 tablet    Refill:  4    Order Specific Question:   Supervising Provider    Answer:   Lavera Guise [[id number]]    Time spent: [place], MD  Internal Medicine

## 2017-12-04 LAB — URINALYSIS, ROUTINE W REFLEX MICROSCOPIC
BILIRUBIN UA: NEGATIVE
GLUCOSE, UA: NEGATIVE
Ketones, UA: NEGATIVE
LEUKOCYTES UA: NEGATIVE
Nitrite, UA: NEGATIVE
PROTEIN UA: NEGATIVE
RBC UA: NEGATIVE
SPEC GRAV UA: 1.016 (ref 1.005–1.030)
Urobilinogen, Ur: 0.2 mg/dL (ref 0.2–1.0)
pH, UA: 6 (ref 5.0–7.5)

## 2017-12-09 ENCOUNTER — Encounter: Payer: Self-pay | Admitting: Nurse Practitioner

## 2017-12-19 ENCOUNTER — Encounter: Payer: Self-pay | Admitting: Emergency Medicine

## 2017-12-19 ENCOUNTER — Other Ambulatory Visit: Payer: Self-pay

## 2017-12-19 ENCOUNTER — Emergency Department
Admission: EM | Admit: 2017-12-19 | Discharge: 2017-12-19 | Disposition: A | Discharge status: Home / Self Care | Payer: Medicare Other | Attending: Emergency Medicine | Admitting: Emergency Medicine

## 2017-12-19 ENCOUNTER — Emergency Department: Payer: Medicare Other

## 2017-12-19 DIAGNOSIS — I129 Hypertensive chronic kidney disease with stage 1 through stage 4 chronic kidney disease, or unspecified chronic kidney disease: Secondary | ICD-10-CM | POA: Insufficient documentation

## 2017-12-19 DIAGNOSIS — Z7982 Long term (current) use of aspirin: Secondary | ICD-10-CM | POA: Insufficient documentation

## 2017-12-19 DIAGNOSIS — N189 Chronic kidney disease, unspecified: Secondary | ICD-10-CM | POA: Insufficient documentation

## 2017-12-19 DIAGNOSIS — M25512 Pain in left shoulder: Secondary | ICD-10-CM | POA: Insufficient documentation

## 2017-12-19 DIAGNOSIS — I2510 Atherosclerotic heart disease of native coronary artery without angina pectoris: Secondary | ICD-10-CM | POA: Diagnosis not present

## 2017-12-19 DIAGNOSIS — Z79899 Other long term (current) drug therapy: Secondary | ICD-10-CM | POA: Insufficient documentation

## 2017-12-19 DIAGNOSIS — Z951 Presence of aortocoronary bypass graft: Secondary | ICD-10-CM | POA: Insufficient documentation

## 2017-12-19 DIAGNOSIS — Z87891 Personal history of nicotine dependence: Secondary | ICD-10-CM | POA: Diagnosis not present

## 2017-12-19 MED ORDER — NAPROXEN 500 MG PO TABS: 500.0000 mg | ORAL_TABLET | Freq: Two times a day (BID) | ORAL | Status: DC

## 2017-12-19 MED ORDER — NAPROXEN 500 MG PO TABS
500.0000 mg | ORAL_TABLET | Freq: Once | ORAL | Status: AC
  Administered 2017-12-19: 500 mg via ORAL
  Filled 2017-12-19: qty 1

## 2017-12-19 NOTE — ED Triage Notes (Signed)
Chair broke and patient fell to floor. Pain L shoulder.

## 2017-12-19 NOTE — ED Notes (Signed)
ED Provider at bedside. 

## 2017-12-19 NOTE — ED Notes (Signed)
Pt fell out of chair when it folded from under him while at an auction approx 2 hours ago. Pt states he is having pain in his left shoulder that gets worse with movement. Pt states he feels like his left hand is swelling when he opens and closes his fist.

## 2017-12-19 NOTE — ED Provider Notes (Signed)
[facility] Emergency Department Provider Note   ____________________________________________   First MD Initiated Contact with Patient 12/19/17 1405     (approximate)  I have reviewed the triage vital signs and the nursing notes.   HISTORY  Chief Complaint Shoulder Injury    HPI Tyler Russell is a 62 y.o. male patient complain of left shoulder pain secondary to breaking a fall after sitting in a broken chair.  Patient did pain to the anterior superior aspect of the shoulder.  Patient state pain increased with adduction or attempted overhead reaching.  Patient rates the pain as a 4/10.  Patient described the pain is "achy".  No palliative measures for complaint.  Past Medical History:  Diagnosis Date  . Alcohol abuse   . Arthritis   . Chronic back pain   . Chronic kidney disease   . Complication of anesthesia    had difficulty with breathing, had difficulty waking up  . Gonorrhea   . Headache(784.0)   . Hypercholesteremia    no medication at this time  . Hypertension   . Trigger finger    on both hands  . Ulcer    on medication    Patient Active Problem List   Diagnosis Date Noted  . Benign essential HTN 05/21/2017  . CAD (coronary artery disease) 04/08/2016  . Mixed hyperlipidemia 04/08/2016  . 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 Non-ST elevated myocardial infarction (non-STEMI) ([place]) 02/27/2016  . NSTEMI (non-ST elevated myocardial infarction) ([place]) 02/26/2016  . Dyspepsia 09/04/2015    Past Surgical History:  Procedure Laterality Date  . ANTERIOR LAT LUMBAR FUSION  08/28/2011   Procedure: ANTERIOR LATERAL LUMBAR FUSION 1 LEVEL;  Surgeon: Dahlia Bailiff;  Location: [place];  Service: Orthopedics;  Laterality: N/A;  Lateral L3-4 Fusion Posterior Spinal Fusion L3-4, Possible Removal of Hardware   . CARDIAC CATHETERIZATION N/A 02/27/2016   Procedure: Left Heart Cath and Coronary Angiography;  Surgeon: Corey Skains, MD;  Location: [place] CV LAB;  Service: Cardiovascular;  Laterality: N/A;  . CERVICAL DISC SURGERY     3 ruptured disc, 2012  . CORONARY ARTERY BYPASS GRAFT N/A 02/29/2016   Procedure: CORONARY ARTERY BYPASS GRAFTING (CABG), ON PUMP, TIMES THREE, USING LEFT INTERNAL MAMMARY ARTERY, RIGHT GREATER SAPHENOUS VEIN HARVESTED ENDOSCOPICALLY;  Surgeon: Gaye Pollack, MD;  Location: [place];  Service: Open Heart Surgery;  Laterality: N/A;  LIMA to LAD, SVG to RAMUS INTERMEDIATE, SVG to OM  . ESOPHAGOGASTRODUODENOSCOPY (EGD) WITH PROPOFOL N/A 11/23/2015   Procedure: ESOPHAGOGASTRODUODENOSCOPY (EGD) WITH PROPOFOL;  Surgeon: Lollie Sails, MD;  Location: [facility] ENDOSCOPY;  Service: Endoscopy;  Laterality: N/A;  . KNEE SURGERY     left  . LUMBAR FUSION     2008  . TEE WITHOUT CARDIOVERSION N/A 02/29/2016   Procedure: TRANSESOPHAGEAL ECHOCARDIOGRAM (TEE);  Surgeon: Gaye Pollack, MD;  Location: [place];  Service: Open Heart Surgery;  Laterality: N/A;  . [place]    Prior to Admission medications   Medication Sig Start Date End Date Taking? Authorizing Provider  aspirin 81 MG tablet Take 81 mg by mouth daily.    [provider]  blood glucose meter kit and supplies Dispense based on patient and insurance preference. Use up to four times daily as directed. (FOR ICD-9 250.00, 250.01). 03/04/16   Barrett, Erin R, PA-C  brompheniramine-pseudoephedrine-DM 30-2-10 MG/5ML syrup Take 5 mLs by mouth 4 (four) times daily as needed. 10/16/17   Leretha Pol  E, NP  escitalopram (LEXAPRO) 10 MG tablet Take 1 tablet (10 mg total) by mouth daily. 12/03/17   Ronnell Freshwater, NP  gemfibrozil (LOPID) 600 MG tablet Take 600 mg by mouth 2 (two) times daily before a meal.    [provider]  lisinopril (ZESTRIL) 2.5 MG tablet Take 1 tablet (2.5 mg total) by mouth daily. 03/04/16   Barrett, Erin R, PA-C  meloxicam (MOBIC) 7.5 MG tablet Take 1 tablet (7.5 mg total) by mouth 2 (two) times daily. 10/16/17   Ronnell Freshwater, NP  metFORMIN (GLUCOPHAGE) 850 MG tablet Take 1 tablet (850 mg total) by mouth 2 (two) times daily with a meal. Patient not taking: Reported on 04/02/2016 03/04/16   Barrett, Lodema Hong, PA-C  metoprolol (LOPRESSOR) 50 MG tablet Take 1 tablet (50 mg total) by mouth 2 (two) times daily. 03/04/16   Barrett, Erin R, PA-C  naproxen (NAPROSYN) 500 MG tablet Take 1 tablet (500 mg total) by mouth 2 (two) times daily with a meal. 12/19/17   Sable Feil, PA-C  omeprazole (PRILOSEC) 20 MG capsule Take 20 mg by mouth daily.     [provider]    Allergies Atorvastatin; Vicodin [hydrocodone-acetaminophen]; Lactalbumin; and Milk-related compounds  Family History  Problem Relation Age of Onset  . CAD Father   . Stomach cancer Maternal Grandmother     Social History Social History   Tobacco Use  . Smoking status: Former Smoker    Types: Cigarettes  . Smokeless tobacco: Never Used  . Tobacco comment: stopped at age 4 years  Substance Use Topics  . Alcohol use: Yes    Comment: occassional  . Drug use: No    Review of Systems Constitutional: No fever/chills Eyes: No visual changes. ENT: No sore throat. Cardiovascular: Denies chest pain. Respiratory: Denies shortness of breath. Gastrointestinal: No abdominal pain.  No nausea, no vomiting.  No diarrhea.  No constipation. Genitourinary: Negative for dysuria. Musculoskeletal: Left shoulder pain. Skin: Negative for rash. Neurological: Negative for headaches, focal weakness or numbness. Psychiatric:Alcohol abuse Endocrine:Hyperlipidemia and hypertension Allergic/Immunilogical: See medication list ____________________________________________   PHYSICAL EXAM:  VITAL SIGNS: ED Triage Vitals  Enc Vitals Group     BP 12/19/17 1248 (!) 171/80     Pulse Rate 12/19/17 1248 88     Resp 12/19/17 1248 20     Temp 12/19/17 1248 98.2 F (36.8 C)     Temp Source 12/19/17 1248 Oral     SpO2 12/19/17 1248 97 %     Weight 12/19/17 1249  175 lb (79.4 kg)     Height 12/19/17 1249 '5\' 5"'$  (1.651 m)     Head Circumference --      Peak Flow --      Pain Score 12/19/17 1248 4     Pain Loc --      Pain Edu? --      Excl. in [place]? --    Constitutional: Alert and oriented. Well appearing and in no acute distress. Cardiovascular: Normal rate, regular rhythm. Grossly normal heart sounds.  Good peripheral circulation.  Elevated blood pressure. Respiratory: Normal respiratory effort.  No retractions. Lungs CTAB. Musculoskeletal: No obvious deformity of the left shoulder.  Moderate guarding palpation to [facility] joint.  Decreased range of motion abduction overhead reaching. Neurologic:  Normal speech and language. No gross focal neurologic deficits are appreciated. No gait instability. Skin:  Skin is warm, dry and intact. No rash noted. Psychiatric: Mood and affect are normal. Speech and  behavior are normal.  ____________________________________________   LABS (all labs ordered are listed, but only abnormal results are displayed)  Labs Reviewed - No data to display ____________________________________________  EKG   ____________________________________________  RADIOLOGY    Official radiology report(s): Dg Shoulder Left  Result Date: 12/19/2017 CLINICAL DATA:  Pain after fall EXAM: LEFT SHOULDER - 2+ VIEW COMPARISON:  None. FINDINGS: There is no evidence of fracture or dislocation. There is no evidence of arthropathy or other focal bone abnormality. Soft tissues are unremarkable. IMPRESSION: Negative. Electronically Signed   By: Dorise Bullion III M.D   On: 12/19/2017 13:15    ____________________________________________   PROCEDURES  Procedure(s) performed:   Procedures  Critical Care performed: No  ____________________________________________   INITIAL IMPRESSION / ASSESSMENT AND PLAN / ED COURSE  As part of my medical decision making, I reviewed the following data within the [place]     Right shoulder pain secondary to fall.  Discussed negative x-ray findings with patient.  Patient given discharge care instruction.  Patient placed in arm sling and advised to take naproxen as needed.  Patient advised follow-up PCP if no improvement in 1 week.      ____________________________________________   FINAL CLINICAL IMPRESSION(S) / ED DIAGNOSES  Final diagnoses:  Acute pain of left shoulder     ED Discharge Orders        Ordered    naproxen (NAPROSYN) 500 MG tablet  2 times daily with meals     12/19/17 1414       Note:  This document was prepared using Dragon voice recognition software and may include unintentional dictation errors.    Sable Feil, PA-C 12/19/17 [place], [place], MD 12/19/17 1524

## 2017-12-19 NOTE — Discharge Instructions (Signed)
Advised arm sling for 3-5 days as needed. °

## 2017-12-24 DIAGNOSIS — R7301 Impaired fasting glucose: Secondary | ICD-10-CM | POA: Insufficient documentation

## 2017-12-24 DIAGNOSIS — F411 Generalized anxiety disorder: Secondary | ICD-10-CM | POA: Insufficient documentation

## 2017-12-24 DIAGNOSIS — Z1211 Encounter for screening for malignant neoplasm of colon: Secondary | ICD-10-CM | POA: Insufficient documentation

## 2017-12-24 DIAGNOSIS — R300 Dysuria: Secondary | ICD-10-CM | POA: Insufficient documentation

## 2018-02-13 ENCOUNTER — Other Ambulatory Visit: Payer: Self-pay | Admitting: Internal Medicine

## 2018-02-15 ENCOUNTER — Other Ambulatory Visit: Payer: Self-pay

## 2018-02-15 MED ORDER — MELOXICAM 7.5 MG PO TABS: 7.5000 mg | ORAL_TABLET | Freq: Two times a day (BID) | ORAL | 3 refills | Status: DC

## 2018-06-14 ENCOUNTER — Ambulatory Visit: Payer: Self-pay | Admitting: Nurse Practitioner

## 2018-06-18 ENCOUNTER — Telehealth: Payer: Self-pay

## 2018-06-18 ENCOUNTER — Other Ambulatory Visit: Payer: Self-pay

## 2018-06-18 MED ORDER — OMEPRAZOLE 20 MG PO CPDR: DELAYED_RELEASE_CAPSULE | ORAL | 0 refills | Status: DC

## 2018-06-18 MED ORDER — MELOXICAM 7.5 MG PO TABS: 7.5000 mg | ORAL_TABLET | Freq: Two times a day (BID) | ORAL | 0 refills | Status: DC

## 2018-06-18 NOTE — Telephone Encounter (Signed)
Try to call pt no answe need appt for further refills

## 2018-06-18 NOTE — Telephone Encounter (Signed)
Try to call pt no answer we send 30 days med need to been seen

## 2019-05-04 HISTORY — PX: CARPAL TUNNEL RELEASE: SHX101

## 2020-02-29 HISTORY — PX: CARPAL TUNNEL RELEASE: SHX101

## 2020-10-04 LAB — COLOGUARD: COLOGUARD: NEGATIVE

## 2020-11-07 ENCOUNTER — Encounter: Payer: Self-pay | Admitting: Emergency Medicine

## 2020-11-07 ENCOUNTER — Emergency Department
Admission: EM | Admit: 2020-11-07 | Discharge: 2020-11-07 | Disposition: A | Discharge status: Home / Self Care | Payer: Medicare PPO | Attending: Emergency Medicine | Admitting: Emergency Medicine

## 2020-11-07 ENCOUNTER — Other Ambulatory Visit: Payer: Self-pay

## 2020-11-07 DIAGNOSIS — Z87891 Personal history of nicotine dependence: Secondary | ICD-10-CM | POA: Insufficient documentation

## 2020-11-07 DIAGNOSIS — Z951 Presence of aortocoronary bypass graft: Secondary | ICD-10-CM | POA: Diagnosis not present

## 2020-11-07 DIAGNOSIS — Z7984 Long term (current) use of oral hypoglycemic drugs: Secondary | ICD-10-CM | POA: Diagnosis not present

## 2020-11-07 DIAGNOSIS — Z7982 Long term (current) use of aspirin: Secondary | ICD-10-CM | POA: Diagnosis not present

## 2020-11-07 DIAGNOSIS — I2510 Atherosclerotic heart disease of native coronary artery without angina pectoris: Secondary | ICD-10-CM | POA: Insufficient documentation

## 2020-11-07 DIAGNOSIS — J3489 Other specified disorders of nose and nasal sinuses: Secondary | ICD-10-CM | POA: Diagnosis not present

## 2020-11-07 DIAGNOSIS — Z79899 Other long term (current) drug therapy: Secondary | ICD-10-CM | POA: Insufficient documentation

## 2020-11-07 DIAGNOSIS — E1122 Type 2 diabetes mellitus with diabetic chronic kidney disease: Secondary | ICD-10-CM | POA: Insufficient documentation

## 2020-11-07 DIAGNOSIS — R0981 Nasal congestion: Secondary | ICD-10-CM | POA: Diagnosis not present

## 2020-11-07 DIAGNOSIS — N189 Chronic kidney disease, unspecified: Secondary | ICD-10-CM | POA: Diagnosis not present

## 2020-11-07 DIAGNOSIS — I129 Hypertensive chronic kidney disease with stage 1 through stage 4 chronic kidney disease, or unspecified chronic kidney disease: Secondary | ICD-10-CM | POA: Diagnosis not present

## 2020-11-07 LAB — CBG MONITORING, ED: Glucose-Capillary: 141 mg/dL — ABNORMAL HIGH (ref 70–99)

## 2020-11-07 NOTE — ED Triage Notes (Signed)
Pt to triage via w/c with no distress noted; pt st last 3 days having c/o sinus drainage and sensation that "lungs are dry"; pt denies pain, denies SHOB; pt requests FSBS, st "just like to keep a check on it"

## 2020-11-07 NOTE — Discharge Instructions (Signed)
Use Tylenol for pain and fevers.  Up to 1000 mg per dose, up to 4 times per day.  Do not take more than 4000 mg of Tylenol/acetaminophen within 24 hours..  I would recommend that you pick up some over-the-counter Mucinex, or its generic equivalent.  There are many different formulations of Mucinex, and try to avoid the Mucinex-DM varieties and everything beyond regular Mucinex/guaifenesin.   Continue your other prescription medications and follow-up with your PCP.

## 2020-11-07 NOTE — ED Notes (Signed)
Documentation completed by Roma Schanz SRN reviewed and approved by this RN

## 2020-11-07 NOTE — ED Provider Notes (Signed)
[facility] Emergency Department Provider Note ____________________________________________   Event Date/Time   First MD Initiated Contact with Patient 11/07/20 [phone]     (approximate)  I have reviewed the triage vital signs and the nursing notes.  HISTORY  Chief Complaint Nasal Congestion   HPI Tyler Russell is a 65 y.o. malewho presents to the ED for evaluation of nasal congestion  Chart review indicates GERD, DM, HTN, HLD.  CAD s/p CABG remotely.  Patient presents to the ED with 3 days of upper respiratory congestion and clear rhinorrhea.  Denies fever, sore throat, shortness of breath, cough, chest pain, palpitations, ear pain or discharge, odynophagia or voice changes.  He has not taken any OTC medications.  Patient reports having difficulty sleeping tonight due to his congestion, so he presents to the ED for evaluation.  Past Medical History:  Diagnosis Date  . Alcohol abuse   . Arthritis   . Chronic back pain   . Chronic kidney disease   . Complication of anesthesia    had difficulty with breathing, had difficulty waking up  . Gonorrhea   . Headache(784.0)   . Hypercholesteremia    no medication at this time  . Hypertension   . Trigger finger    on both hands  . Ulcer    on medication    Patient Active Problem List   Diagnosis Date Noted  . Screening for colon cancer 12/24/2017  . Generalized anxiety disorder 12/24/2017  . Impaired fasting glucose 12/24/2017  . Dysuria 12/24/2017  . Essential hypertension 05/21/2017  . CAD (coronary artery disease) 04/08/2016  . Mixed hyperlipidemia 04/08/2016  . 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 Non-ST elevated myocardial infarction (non-STEMI) ([place]) 02/27/2016  . NSTEMI (non-ST elevated myocardial infarction) ([place]) 02/26/2016  . Dyspepsia 09/04/2015    Past Surgical History:  Procedure Laterality Date  . ANTERIOR LAT LUMBAR FUSION  08/28/2011   Procedure: ANTERIOR LATERAL LUMBAR FUSION 1 LEVEL;   Surgeon: Dahlia Bailiff;  Location: [place];  Service: Orthopedics;  Laterality: N/A;  Lateral L3-4 Fusion Posterior Spinal Fusion L3-4, Possible Removal of Hardware   . CARDIAC CATHETERIZATION N/A 02/27/2016   Procedure: Left Heart Cath and Coronary Angiography;  Surgeon: Corey Skains, MD;  Location: [place] [facility];  Service: Cardiovascular;  Laterality: N/A;  . CERVICAL DISC SURGERY     3 ruptured disc, 2012  . CORONARY ARTERY BYPASS GRAFT N/A 02/29/2016   Procedure: CORONARY ARTERY BYPASS GRAFTING (CABG), ON PUMP, TIMES THREE, USING LEFT INTERNAL MAMMARY ARTERY, RIGHT GREATER SAPHENOUS VEIN HARVESTED ENDOSCOPICALLY;  Surgeon: Gaye Pollack, MD;  Location: [place];  Service: Open Heart Surgery;  Laterality: N/A;  LIMA to LAD, SVG to RAMUS INTERMEDIATE, SVG to OM  . ESOPHAGOGASTRODUODENOSCOPY (EGD) WITH PROPOFOL N/A 11/23/2015   Procedure: ESOPHAGOGASTRODUODENOSCOPY (EGD) WITH PROPOFOL;  Surgeon: Lollie Sails, MD;  Location: [facility] ENDOSCOPY;  Service: Endoscopy;  Laterality: N/A;  . KNEE SURGERY     left  . LUMBAR FUSION     2008  . TEE WITHOUT CARDIOVERSION N/A 02/29/2016   Procedure: TRANSESOPHAGEAL ECHOCARDIOGRAM (TEE);  Surgeon: Gaye Pollack, MD;  Location: [place];  Service: Open Heart Surgery;  Laterality: N/A;  . [place]    Prior to Admission medications   Medication Sig Start Date End Date Taking? Authorizing Provider  aspirin 81 MG tablet Take 81 mg by mouth daily.    [provider]  blood glucose meter kit and supplies Dispense  based on patient and insurance preference. Use up to four times daily as directed. (FOR ICD-9 250.00, 250.01). 03/04/16   Barrett, Erin R, PA-C  brompheniramine-pseudoephedrine-DM 30-2-10 MG/5ML syrup Take 5 mLs by mouth 4 (four) times daily as needed. 10/16/17   Carlean Jews, NP  escitalopram (LEXAPRO) 10 MG tablet Take 1 tablet (10 mg total) by mouth daily. 12/03/17   Carlean Jews, NP  gemfibrozil (LOPID) 600 MG tablet Take  600 mg by mouth 2 (two) times daily before a meal.    [provider]  lisinopril (ZESTRIL) 2.5 MG tablet Take 1 tablet (2.5 mg total) by mouth daily. 03/04/16   Barrett, Erin R, PA-C  meloxicam (MOBIC) 7.5 MG tablet Take 1 tablet (7.5 mg total) by mouth 2 (two) times daily. 06/18/18   Carlean Jews, NP  metFORMIN (GLUCOPHAGE) 850 MG tablet Take 1 tablet (850 mg total) by mouth 2 (two) times daily with a meal. Patient not taking: Reported on 04/02/2016 03/04/16   Barrett, Rae Roam, PA-C  metoprolol (LOPRESSOR) 50 MG tablet Take 1 tablet (50 mg total) by mouth 2 (two) times daily. 03/04/16   Barrett, Erin R, PA-C  naproxen (NAPROSYN) 500 MG tablet Take 1 tablet (500 mg total) by mouth 2 (two) times daily with a meal. 12/19/17   Joni Reining, PA-C  omeprazole (PRILOSEC) 20 MG capsule TAKE 1 CAPSULE BY MOUTH EVERY DAY 06/18/18   Carlean Jews, NP    Allergies Atorvastatin, Vicodin [hydrocodone-acetaminophen], Lactalbumin, and Milk-related compounds  Family History  Problem Relation Age of Onset  . CAD Father   . Stomach cancer Maternal Grandmother    Social History Social History   Tobacco Use  . Smoking status: Former Smoker    Types: Cigarettes  . Smokeless tobacco: Never Used  . Tobacco comment: stopped at age 60 years  Vaping Use  . Vaping Use: Never used  Substance Use Topics  . Alcohol use: Yes    Comment: occassional  . Drug use: No   Review of Systems  Constitutional: No fever/chills Eyes: No visual changes. ENT: No sore throat.  Positive for upper respiratory congestion. Cardiovascular: Denies chest pain. Respiratory: Denies shortness of breath. Gastrointestinal: No abdominal pain.  No nausea, no vomiting.  No diarrhea.  No constipation. Genitourinary: Negative for dysuria. Musculoskeletal: Negative for back pain. Skin: Negative for rash. Neurological: Negative for focal weakness or numbness. ____________________________________________   PHYSICAL  EXAM:  VITAL SIGNS: Vitals:   11/07/20 0251  BP: (!) 168/85  Pulse: 85  Resp: (!) 22  Temp: 97.7 F (36.5 C)  SpO2: 100%     Constitutional: Alert and oriented. Well appearing and in no acute distress.  Sniffling a few times during our conversation, but appears well and is conversational in full sentences.  Ambulatory independently with a normal gait. Eyes: Conjunctivae are normal. PERRL. EOMI. Head: Atraumatic.  No maxillary or frontal sinus tenderness to palpation.  Tympanic membranes visualized bilaterally without erythema or purulence. Nose: Clear congestion/rhinnorhea.  No nasal septal hematoma. Mouth/Throat: Mucous membranes are moist.  Oropharynx non-erythematous.  Uvula is midline. Neck: No stridor. No cervical spine tenderness to palpation.  No fullness or firmness to the floor of the mouth Cardiovascular: Normal rate, regular rhythm. Grossly normal heart sounds.  Good peripheral circulation. Respiratory: Normal respiratory effort.  No retractions. Lungs CTAB. Gastrointestinal: Soft , nondistended, nontender to palpation. No CVA tenderness. Musculoskeletal: No lower extremity tenderness nor edema.  No joint effusions. No signs of acute trauma. Neurologic:  Normal speech and language. No gross focal neurologic deficits are appreciated. No gait instability noted. Skin:  Skin is warm, dry and intact. No rash noted. Psychiatric: Mood and affect are normal. Speech and behavior are normal.  ____________________________________________   LABS (all labs ordered are listed, but only abnormal results are displayed)  Labs Reviewed  CBG MONITORING, ED - Abnormal; Notable for the following components:      Result Value   Glucose-Capillary 141 (*)    All other components within normal limits   ____________________________________________   MDM / ED COURSE   65 year old man presents to the ED with 3 days of isolated nasal congestion without evidence of additional acute pathology,  and amenable to outpatient management.  Normal vitals on room air.  Exam demonstrates clear rhinorrhea and mild upper respiratory congestion as is on the source of pathology.  No signs of pharyngitis, PTA, Ludwig's angina, AOM, bacterial sinusitis, neurovascular deficits or trauma.  He looks well overall, but is sniffling a couple times during our conversation.  I advised patient of the addition of Mucinex and Tylenol to his regimen and we discussed return precautions for the ED.  Patient medically stable for outpatient management.     ____________________________________________   FINAL CLINICAL IMPRESSION(S) / ED DIAGNOSES  Final diagnoses:  Nasal congestion     ED Discharge Orders    None       Tiernan Suto   Note:  This document was prepared using Dragon voice recognition software and may include unintentional dictation errors.   Vladimir Crofts, MD 11/07/20 [phone]

## 2021-04-01 ENCOUNTER — Other Ambulatory Visit: Payer: Self-pay | Admitting: Sports Medicine

## 2021-04-01 ENCOUNTER — Other Ambulatory Visit (HOSPITAL_COMMUNITY): Payer: Self-pay | Admitting: Sports Medicine

## 2021-04-01 DIAGNOSIS — M7552 Bursitis of left shoulder: Secondary | ICD-10-CM

## 2021-04-01 DIAGNOSIS — G8929 Other chronic pain: Secondary | ICD-10-CM

## 2021-04-01 DIAGNOSIS — M75102 Unspecified rotator cuff tear or rupture of left shoulder, not specified as traumatic: Secondary | ICD-10-CM

## 2021-04-01 DIAGNOSIS — M25512 Pain in left shoulder: Secondary | ICD-10-CM

## 2021-04-01 DIAGNOSIS — M7542 Impingement syndrome of left shoulder: Secondary | ICD-10-CM

## 2021-04-14 ENCOUNTER — Ambulatory Visit: Admission: RE | Admit: 2021-04-14 | Payer: Medicare Other | Source: Ambulatory Visit

## 2021-04-17 ENCOUNTER — Ambulatory Visit
Admission: RE | Admit: 2021-04-17 | Discharge: 2021-04-17 | Disposition: A | Discharge status: Home / Self Care | Payer: Medicare Other | Source: Ambulatory Visit | Attending: Sports Medicine | Admitting: Sports Medicine

## 2021-04-17 ENCOUNTER — Other Ambulatory Visit: Payer: Self-pay

## 2021-04-17 DIAGNOSIS — M75102 Unspecified rotator cuff tear or rupture of left shoulder, not specified as traumatic: Secondary | ICD-10-CM | POA: Diagnosis present

## 2021-04-17 DIAGNOSIS — M25512 Pain in left shoulder: Secondary | ICD-10-CM | POA: Diagnosis present

## 2021-04-17 DIAGNOSIS — M7542 Impingement syndrome of left shoulder: Secondary | ICD-10-CM | POA: Insufficient documentation

## 2021-04-17 DIAGNOSIS — G8929 Other chronic pain: Secondary | ICD-10-CM

## 2021-04-17 DIAGNOSIS — M7552 Bursitis of left shoulder: Secondary | ICD-10-CM | POA: Diagnosis present

## 2021-05-03 ENCOUNTER — Other Ambulatory Visit: Payer: Self-pay | Admitting: Orthopedic Surgery

## 2021-05-24 ENCOUNTER — Encounter: Payer: Self-pay | Admitting: Orthopedic Surgery

## 2021-05-27 ENCOUNTER — Other Ambulatory Visit
Admission: RE | Admit: 2021-05-27 | Discharge: 2021-05-27 | Disposition: A | Discharge status: Home / Self Care | Payer: Medicare Other | Source: Ambulatory Visit | Attending: Orthopedic Surgery | Admitting: Orthopedic Surgery

## 2021-05-27 HISTORY — DX: Personal history of urinary calculi: Z87.442

## 2021-05-27 HISTORY — DX: Type 2 diabetes mellitus without complications: E11.9

## 2021-05-27 HISTORY — DX: Gastro-esophageal reflux disease without esophagitis: K21.9

## 2021-05-27 NOTE — Patient Instructions (Addendum)
Your procedure is scheduled on: Friday, September 16 Report to the Registration Desk on the 1st floor of the [facility] To find out your arrival time, please call [phone] between 1PM - 3PM on: Thursday, September 15  REMEMBER: Instructions that are not followed completely may result in serious medical risk, up to and including death; or upon the discretion of your surgeon and anesthesiologist your surgery may need to be rescheduled.  Do not eat food after midnight the night before surgery.  No gum chewing, lozengers or hard candies.  You may however, drink CLEAR liquids up to 2 hours before you are scheduled to arrive for your surgery. Do not drink anything within 2 hours of your scheduled arrival time.  Clear liquids include: - water  - apple juice without pulp - gatorade (not RED, PURPLE, OR BLUE) - black coffee or tea (Do NOT add milk or creamers to the coffee or tea) Do NOT drink anything that is not on this list.  Type 1 and Type 2 diabetics should only drink water.  In addition, your doctor has ordered for you to drink the provided  Ensure Pre-Surgery Clear Carbohydrate Drink  Gatorade G2 Drinking this carbohydrate drink up to two hours before surgery helps to reduce insulin resistance and improve patient outcomes. Please complete drinking 2 hours prior to scheduled arrival time.  TAKE THESE MEDICATIONS THE MORNING OF SURGERY WITH A SIP OF WATER:  Amlodipine Escitalopram (Lexapro) Gemfibrozil (Lopid) Metoprolol Pantoprazole (Protonix) - (take one the night before and one on the morning of surgery - helps to prevent nausea after surgery.)  Stop Metformin 2 days prior to surgery. Last day to take Metformin is Tuesday, September 13. Resume AFTER surgery.  Follow recommendations from Cardiologist regarding stopping Aspirin. According to the clearance form, stop aspirin 4 days before surgery. Last day to take Aspirin is Sunday, September 11. Resume AFTER surgery per  surgeon instruction.  One week prior to surgery: starting September 9 Stop MELOXICAM, Anti-inflammatories (NSAIDS) such as Advil, Aleve, Ibuprofen, Motrin, Naproxen, Naprosyn and Aspirin based products such as Excedrin, Goodys Powder, BC Powder. Stop ANY OVER THE COUNTER supplements until after surgery. You may however, continue to take Tylenol if needed for pain up until the day of surgery.  No Alcohol for 24 hours before or after surgery.  No Smoking including e-cigarettes for 24 hours prior to surgery.  No chewable tobacco products for at least 6 hours prior to surgery.  No nicotine patches on the day of surgery.  Do not use any "recreational" drugs for at least a week prior to your surgery.  Please be advised that the combination of cocaine and anesthesia may have negative outcomes, up to and including death. If you test positive for cocaine, your surgery will be cancelled.  On the morning of surgery brush your teeth with toothpaste and water, you may rinse your mouth with mouthwash if you wish. Do not swallow any toothpaste or mouthwash.  Do not wear jewelry, make-up, hairpins, clips or nail polish.  Do not wear lotions, powders, or perfumes.   Do not shave body from the neck down 48 hours prior to surgery just in case you cut yourself which could leave a site for infection.  Also, freshly shaved skin may become irritated if using the CHG soap.  Contact lenses, hearing aids and dentures may not be worn into surgery.  Do not bring valuables to the hospital. [facility] is not responsible for any missing/lost belongings or valuables.  Use CHG Soap as directed on instruction sheet.  Bring your C-PAP to the hospital with you in case you may have to spend the night.   Notify your doctor if there is any change in your medical condition (cold, fever, infection).  Wear comfortable clothing (specific to your surgery type) to the hospital.  After surgery, you can help prevent lung  complications by doing breathing exercises.  Take deep breaths and cough every 1-2 hours. Your doctor may order a device called an Incentive Spirometer to help you take deep breaths.  If you are being discharged the day of surgery, you will not be allowed to drive home. You will need a responsible adult (18 years or older) to drive you home and stay with you that night.   If you are taking public transportation, you will need to have a responsible adult (18 years or older) with you. Please confirm with your physician that it is acceptable to use public transportation.   Please call the Pre-admissions Testing Dept. at [phone] if you have any questions about these instructions.  Surgery Visitation Policy:  Patients undergoing a surgery or procedure may have one family member or support person with them as long as that person is not COVID-19 positive or experiencing its symptoms.  That person may remain in the waiting area during the procedure.

## 2021-05-31 ENCOUNTER — Ambulatory Visit: Payer: Medicare Other | Admitting: Anesthesiology

## 2021-05-31 ENCOUNTER — Encounter: Payer: Self-pay | Admitting: Orthopedic Surgery

## 2021-05-31 ENCOUNTER — Encounter
Admission: RE | Disposition: A | Discharge status: Home / Self Care | Payer: Self-pay | Source: Home / Self Care | Attending: Orthopedic Surgery

## 2021-05-31 ENCOUNTER — Ambulatory Visit
Admission: RE | Admit: 2021-05-31 | Discharge: 2021-05-31 | Disposition: A | Discharge status: Home / Self Care | Payer: Medicare Other | Attending: Orthopedic Surgery | Admitting: Orthopedic Surgery

## 2021-05-31 ENCOUNTER — Other Ambulatory Visit: Payer: Self-pay

## 2021-05-31 DIAGNOSIS — Z7984 Long term (current) use of oral hypoglycemic drugs: Secondary | ICD-10-CM | POA: Diagnosis not present

## 2021-05-31 DIAGNOSIS — M75102 Unspecified rotator cuff tear or rupture of left shoulder, not specified as traumatic: Secondary | ICD-10-CM | POA: Insufficient documentation

## 2021-05-31 DIAGNOSIS — Z885 Allergy status to narcotic agent status: Secondary | ICD-10-CM | POA: Diagnosis not present

## 2021-05-31 DIAGNOSIS — Z888 Allergy status to other drugs, medicaments and biological substances status: Secondary | ICD-10-CM | POA: Insufficient documentation

## 2021-05-31 DIAGNOSIS — Z791 Long term (current) use of non-steroidal anti-inflammatories (NSAID): Secondary | ICD-10-CM | POA: Diagnosis not present

## 2021-05-31 DIAGNOSIS — Z87891 Personal history of nicotine dependence: Secondary | ICD-10-CM | POA: Diagnosis not present

## 2021-05-31 DIAGNOSIS — Z7982 Long term (current) use of aspirin: Secondary | ICD-10-CM | POA: Insufficient documentation

## 2021-05-31 DIAGNOSIS — Z951 Presence of aortocoronary bypass graft: Secondary | ICD-10-CM | POA: Insufficient documentation

## 2021-05-31 DIAGNOSIS — Z8619 Personal history of other infectious and parasitic diseases: Secondary | ICD-10-CM | POA: Diagnosis not present

## 2021-05-31 DIAGNOSIS — Z79899 Other long term (current) drug therapy: Secondary | ICD-10-CM | POA: Insufficient documentation

## 2021-05-31 HISTORY — PX: SHOULDER ARTHROSCOPY WITH SUBACROMIAL DECOMPRESSION, ROTATOR CUFF REPAIR AND BICEP TENDON REPAIR: SHX5687

## 2021-05-31 LAB — GLUCOSE, CAPILLARY
Glucose-Capillary: 143 mg/dL — ABNORMAL HIGH (ref 70–99)
Glucose-Capillary: 187 mg/dL — ABNORMAL HIGH (ref 70–99)

## 2021-05-31 SURGERY — SHOULDER ARTHROSCOPY WITH SUBACROMIAL DECOMPRESSION, ROTATOR CUFF REPAIR AND BICEP TENDON REPAIR
Anesthesia: Regional | Site: Shoulder | Laterality: Left

## 2021-05-31 MED ORDER — ASPIRIN EC 325 MG PO TBEC: 325.0000 mg | DELAYED_RELEASE_TABLET | Freq: Every day | ORAL | 0 refills | Status: AC

## 2021-05-31 MED ORDER — BUPIVACAINE HCL (PF) 0.5 % IJ SOLN
INTRAMUSCULAR | Status: DC | PRN
  Administered 2021-05-31: 20 mL

## 2021-05-31 MED ORDER — LACTATED RINGERS IV SOLN
INTRAVENOUS | Status: DC | PRN
  Administered 2021-05-31: 12000 mL

## 2021-05-31 MED ORDER — ACETAMINOPHEN 500 MG PO TABS: 1000.0000 mg | ORAL_TABLET | Freq: Three times a day (TID) | ORAL | 2 refills | Status: AC

## 2021-05-31 MED ORDER — BUPIVACAINE LIPOSOME 1.3 % IJ SUSP
INTRAMUSCULAR | Status: DC | PRN
  Administered 2021-05-31: 20 mL

## 2021-05-31 MED ORDER — OXYCODONE HCL 5 MG PO TABS: 5.0000 mg | ORAL_TABLET | ORAL | 0 refills | Status: DC | PRN

## 2021-05-31 MED ORDER — PHENYLEPHRINE HCL (PRESSORS) 10 MG/ML IV SOLN
INTRAVENOUS | Status: DC | PRN
  Administered 2021-05-31 (×3): 50 ug via INTRAVENOUS

## 2021-05-31 MED ORDER — FENTANYL CITRATE (PF) 100 MCG/2ML IJ SOLN
INTRAMUSCULAR | Status: DC | PRN
  Administered 2021-05-31: 50 ug via INTRAVENOUS

## 2021-05-31 MED ORDER — ONDANSETRON 4 MG PO TBDP: 4.0000 mg | ORAL_TABLET | Freq: Three times a day (TID) | ORAL | 0 refills | Status: DC | PRN

## 2021-05-31 MED ORDER — MIDAZOLAM HCL 5 MG/5ML IJ SOLN
INTRAMUSCULAR | Status: DC | PRN
  Administered 2021-05-31: 2 mg via INTRAVENOUS

## 2021-05-31 MED ORDER — PROPOFOL 10 MG/ML IV BOLUS
INTRAVENOUS | Status: DC | PRN
  Administered 2021-05-31: 160 mg via INTRAVENOUS

## 2021-05-31 MED ORDER — LACTATED RINGERS IV SOLN: INTRAVENOUS | Status: DC

## 2021-05-31 MED ORDER — LACTATED RINGERS IR SOLN
Status: DC | PRN
  Administered 2021-05-31: 3000 mL

## 2021-05-31 MED ORDER — ONDANSETRON HCL 4 MG/2ML IJ SOLN
INTRAMUSCULAR | Status: DC | PRN
  Administered 2021-05-31: 4 mg via INTRAVENOUS

## 2021-05-31 MED ORDER — CEFAZOLIN SODIUM-DEXTROSE 2-4 GM/100ML-% IV SOLN: 2.0000 g | INTRAVENOUS | Status: DC

## 2021-05-31 MED ORDER — EPHEDRINE SULFATE 50 MG/ML IJ SOLN
INTRAMUSCULAR | Status: DC | PRN
  Administered 2021-05-31 (×2): 10 mg via INTRAVENOUS

## 2021-05-31 SURGICAL SUPPLY — 63 items
ADAPTER IRRIG TUBE 2 SPIKE SOL (ADAPTER) ×4 IMPLANT
ADH SKN CLS APL DERMABOND .7 (GAUZE/BANDAGES/DRESSINGS) ×1
ADPR TBG 2 SPK PMP STRL ASCP (ADAPTER) ×2
ANCH SUT 2 SWLK 19.1 CLS EYLT (Anchor) ×3 IMPLANT
ANCH SUT 2X2.3 TAPE (Anchor) ×3 IMPLANT
ANCHOR 2.3 SP SGL 1.2 XBRAID (Anchor) ×3 IMPLANT
ANCHOR 3.9 PEEK 3 CORKSCREW (Anchor) ×1 IMPLANT
ANCHOR SWIVELOCK BIO 4.75X19.1 (Anchor) ×3 IMPLANT
APL PRP STRL LF DISP 70% ISPRP (MISCELLANEOUS) ×1
BUR BR 5.5 12 FLUTE (BURR) ×2 IMPLANT
BUR RADIUS 4.0X18.5 (BURR) ×2 IMPLANT
CANNULA PART THRD DISP 5.75X7 (CANNULA) ×2 IMPLANT
CANNULA PARTIAL THREAD 2X7 (CANNULA) ×2 IMPLANT
CHLORAPREP W/TINT 26 (MISCELLANEOUS) ×2 IMPLANT
COOLER POLAR GLACIER W/PUMP (MISCELLANEOUS) ×2 IMPLANT
COVER LIGHT HANDLE UNIVERSAL (MISCELLANEOUS) ×4 IMPLANT
DERMABOND ADVANCED (GAUZE/BANDAGES/DRESSINGS) ×1
DERMABOND ADVANCED .7 DNX12 (GAUZE/BANDAGES/DRESSINGS) ×1 IMPLANT
DRAPE IMP U-DRAPE 54X76 (DRAPES) ×4 IMPLANT
DRAPE INCISE IOBAN 66X45 STRL (DRAPES) ×2 IMPLANT
DRAPE U-SHAPE 48X52 POLY STRL (PACKS) ×2 IMPLANT
DRSG TEGADERM 4X4.75 (GAUZE/BANDAGES/DRESSINGS) ×6 IMPLANT
ELECT REM PT RETURN 9FT ADLT (ELECTROSURGICAL) ×2
ELECTRODE REM PT RTRN 9FT ADLT (ELECTROSURGICAL) ×1 IMPLANT
GAUZE SPONGE 4X4 12PLY STRL (GAUZE/BANDAGES/DRESSINGS) ×2 IMPLANT
GAUZE XEROFORM 1X8 LF (GAUZE/BANDAGES/DRESSINGS) ×2 IMPLANT
GLOVE SRG 8 PF TXTR STRL LF DI (GLOVE) ×2 IMPLANT
GLOVE SURG ENC MOIS LTX SZ7.5 (GLOVE) ×4 IMPLANT
GLOVE SURG UNDER POLY LF SZ8 (GLOVE) ×4
GOWN STRL REIN 2XL XLG LVL4 (GOWN DISPOSABLE) ×2 IMPLANT
GOWN STRL REUS W/ TWL LRG LVL3 (GOWN DISPOSABLE) ×1 IMPLANT
GOWN STRL REUS W/TWL LRG LVL3 (GOWN DISPOSABLE) ×2
IV LACTATED RINGER IRRG 3000ML (IV SOLUTION) ×10
IV LR IRRIG 3000ML ARTHROMATIC (IV SOLUTION) ×8 IMPLANT
KIT CORKSCREW KNTLS 3.9 S/T/P (INSTRUMENTS) ×1 IMPLANT
KIT STABILIZATION SHOULDER (MISCELLANEOUS) ×2 IMPLANT
KIT TURNOVER KIT A (KITS) ×2 IMPLANT
MANIFOLD 4PT FOR NEPTUNE1 (MISCELLANEOUS) ×2 IMPLANT
MASK FACE SPIDER DISP (MASK) ×2 IMPLANT
MAT ABSORB  FLUID 56X50 GRAY (MISCELLANEOUS) ×4
MAT ABSORB FLUID 56X50 GRAY (MISCELLANEOUS) ×2 IMPLANT
NDL SAFETY ECLIPSE 18X1.5 (NEEDLE) ×1 IMPLANT
NEEDLE HYPO 18GX1.5 SHARP (NEEDLE) ×2
PACK ARTHROSCOPY SHOULDER (MISCELLANEOUS) ×2 IMPLANT
PAD WRAPON POLAR SHDR XLG (MISCELLANEOUS) ×1 IMPLANT
PASSER SUT FIRSTPASS SELF (INSTRUMENTS) ×1 IMPLANT
PENCIL SMOKE EVACUATOR (MISCELLANEOUS) ×2 IMPLANT
SET TUBE SUCT SHAVER OUTFL 24K (TUBING) ×2 IMPLANT
SPONGE GAUZE 2X2 8PLY STRL LF (GAUZE/BANDAGES/DRESSINGS) ×2 IMPLANT
SPONGE T-LAP 18X18 ~~LOC~~+RFID (SPONGE) ×2 IMPLANT
SUT ETHILON 3-0 (SUTURE) ×2 IMPLANT
SUT MNCRL 4-0 (SUTURE) ×2
SUT MNCRL 4-0 27XMFL (SUTURE) ×1
SUT VIC AB 0 CT1 36 (SUTURE) ×2 IMPLANT
SUT VIC AB 2-0 CT2 27 (SUTURE) ×2 IMPLANT
SUTURE MNCRL 4-0 27XMF (SUTURE) ×1 IMPLANT
SYR 10ML LL (SYRINGE) ×2 IMPLANT
SYSTEM FBRTK BICEPS 1.9 DRILL (Anchor) ×1 IMPLANT
TAPE MICROFOAM 4IN (TAPE) ×2 IMPLANT
TUBING ARTHRO INFLOW-ONLY STRL (TUBING) ×2 IMPLANT
TUBING CONNECTING 10 (TUBING) ×2 IMPLANT
WAND WEREWOLF FLOW 90D (MISCELLANEOUS) ×2 IMPLANT
WRAPON POLAR PAD SHDR XLG (MISCELLANEOUS) ×2

## 2021-05-31 NOTE — Anesthesia Procedure Notes (Signed)
Anesthesia Regional Block: Interscalene brachial plexus block   Pre-Anesthetic Checklist: , timeout performed,  Correct Patient, Correct Site, Correct Laterality,  Correct Procedure, Correct Position, site marked,  Risks and benefits discussed,  Surgical consent,  Pre-op evaluation,  At surgeon's request and post-op pain management  Laterality: Left and Upper  Prep: chloraprep       Needles:  Injection technique: Single-shot  Needle Type: Stimiplex     Needle Length: 10cm  Needle Gauge: 21     Additional Needles:   Procedures:,,,, ultrasound used (permanent image in chart),,    Narrative:  Start time: 05/31/2021 6:56 AM End time: 05/31/2021 7:02 AM Injection made incrementally with aspirations every 5 mL.  Performed by: Personally  Anesthesiologist: Fletcher Anon, MD  Additional Notes: Functioning IV was confirmed and monitors applied. Ultrasound guidance: relevant anatomy identified, needle position confirmed, local anesthetic spread visualized around nerve(s)., vascular puncture avoided.  Image printed for medical record.  Negative aspiration and no paresthesias; incremental administration of local anesthetic. The patient tolerated the procedure well. Vitals signes recorded in RN notes.

## 2021-05-31 NOTE — Anesthesia Postprocedure Evaluation (Signed)
Anesthesia Post Note  Patient: Tyler Russell  Procedure(s) Performed: Left shoulder arthroscopic subscapularis repair, mini-open rotator cuff repair, subacromial decompression, and biceps tenodesis (Left: Shoulder)     Patient location during evaluation: PACU Anesthesia Type: Regional and General Level of consciousness: awake and alert Pain management: pain level controlled Vital Signs Assessment: post-procedure vital signs reviewed and stable Respiratory status: nonlabored ventilation and spontaneous breathing Cardiovascular status: blood pressure returned to baseline Postop Assessment: no apparent nausea or vomiting Anesthetic complications: no   No notable events documented.  Amali Uhls [employer]

## 2021-05-31 NOTE — Anesthesia Procedure Notes (Signed)
Procedure Name: LMA Insertion Date/Time: 05/31/2021 7:43 AM Performed by: Maree Krabbe, CRNA Pre-anesthesia Checklist: Patient identified, Emergency Drugs available, Suction available, Timeout performed and Patient being monitored Patient Re-evaluated:Patient Re-evaluated prior to induction Oxygen Delivery Method: Circle system utilized Preoxygenation: Pre-oxygenation with 100% oxygen Induction Type: IV induction LMA: LMA inserted LMA Size: 4.0 Number of attempts: 1 Placement Confirmation: positive ETCO2 and breath sounds checked- equal and bilateral Tube secured with: Tape

## 2021-05-31 NOTE — Op Note (Signed)
SURGERY DATE: 05/31/2021  PRE-OP DIAGNOSIS:  1.  Left rotator cuff tear (subscapularis, supraspinatus, infraspinatus) 2.  Left subacromial impingement 3.  Left biceps tendinopathy  POST-OP DIAGNOSIS: 1. Left rotator cuff tear (subscapularis, supraspinatus, infraspinatus) 2. Left subacromial impingement 3. Left biceps tendinopathy  PROCEDURES:  1. Left arthroscopic rotator cuff repair (subscapularis) 2. Left mini-open rotator cuff repair (supraspinatus and infraspinatus) 3. Left open biceps tenodesis 4. Left arthroscopic extensive debridement of shoulder (glenohumeral and subacromial spaces) 5. Left arthroscopic subacromial decompression  SURGEON: Rosealee Albee, MD  ASSISTANT: Sonny Dandy, PA  ANESTHESIA: Gen with Exparel interscalene block  ESTIMATED BLOOD LOSS: 10cc  DRAINS:  none  TOTAL IV FLUIDS: per anesthesia   SPECIMENS: none  IMPLANTS:  - Arthrex 3.98mm Knotless Corkscrew x1 - Arthrex 4.31mm SwiveLock x 3 - Arthrex FiberTak Suture Anchor - Double Loaded x1 - Iconix SPEED double loaded with 1.2 and 2.40mm tape x 2   OPERATIVE FINDINGS:  Examination under anesthesia: A careful examination under anesthesia was performed.  Passive range of motion was: FF: 150; ER at side: 45; ER in abduction: 90; IR in abduction: 50.  Anterior load shift: NT.  Posterior load shift: NT.  Sulcus in neutral: NT.  Sulcus in ER: NT.    Intra-operative findings: A thorough arthroscopic examination of the shoulder was performed.  The findings are: 1. Biceps tendon: significant tendinopathy and mild medial subluxation 2. Superior labrum: Degenerative fraying 3. Posterior labrum and capsule: Degenerative fraying 4. Inferior capsule and inferior recess: normal 5. Glenoid cartilage surface: Grade 1-2 degenerative changes to inferior glenoid 6. Supraspinatus attachment: full-thickness tear involving the entirety of the supraspinatus and anterior infraspinatus 7. Posterior rotator cuff  attachment: normal 8. Humeral head articular cartilage: Focal area of grade 1 degenerative change 9. Rotator interval: significant synovitis 10: Subscapularis tendon: partial-thickness articular sided tear of the superior border 11. Anterior labrum: degenerative tearing 12. IGHL: normal  OPERATIVE REPORT:   Indications for procedure: Tyler Russell is a 65 y.o. male with approximately 3 months of L shoulder pain that began after trying to catch a falling bag. Since that time, the patient has had significant pain and dysfunction, especially with overhead activity. He has attempted medical management, activity modifications and immobilization without improvement in his symptoms. Clinical exam and MRI were suggestive of rotator cuff tear including subscapularis, supraspinatus, and infraspinatus tears; subacromial impingement; and biceps tendinopathy. Given these findings, we decided to proceed with surgical management. After discussion of risks, benefits, and alternatives to surgery, the patient elected to proceed.   Procedure in detail:  I identified Tyler Russell in the pre-operative holding area.  I marked the operative shoulder with my initials. I reviewed the risks and benefits of the proposed surgical intervention, and the patient (and/or patient's guardian) wished to proceed.  Anesthesia was then performed with an Exparel interscalene block.  The patient was transferred to the operative suite and placed in the beach chair position.    SCDs were placed on the lower extremities. Appropriate IV antibiotics were administered prior to incision. The operative upper extremity was then prepped and draped in standard fashion. A time out was performed confirming the correct extremity, correct patient, and correct procedure.   I then created a standard posterior portal with an 11 blade. The glenohumeral joint was easily entered with a blunt trochar and the arthroscope introduced. The findings of  diagnostic arthroscopy are described above.  A standard anterior portal was made.  I debrided degenerative tissue including the  synovitic tissue about the rotator interval and IGHL as well as the anterior, posterior, and superior labrum. I then coagulated the inflamed synovium to obtain hemostasis and reduce the risk of post-operative swelling using an Arthrocare radiofrequency device. The biceps tendon was cut at its insertion on the superior labrum with arthroscopic scissors.  It was debrided down to its insertion on the biceps anchor complex and the superior labrum with an oscillating shaver.    The subscapularis tear was identified.  The rotator interval tissue was debrided.   The comma tissue was identified.  The lesser tuberosity footprint was prepared with a combination of electrocautery and burr.  An Arthrex knotless corkscrew was placed into the lesser tuberosity footprint from the anterior portal.  A BirdBeak was used to pass the passing suture through the torn portion of the subscapularis.  The suture was shuttled through the anchor.  The subscapularis was held in a reduced position with the arm in neutral rotation and the repair was tensioned appropriately. This appropriately reduced the upper border subscapularis tear.  The arm was then internally and externally rotated and the subscapularis was noted to move appropriately with rotation.  The remainder of the suture was then cut.  Next, the arthroscope was then introduced into the subacromial space.  An extensive subacromial bursectomy and debridement was performed using a combination of the shaver and Arthrocare wand. The entire acromial undersurface was exposed and the CA ligament was subperiosteally elevated to expose the anterior acromial hook. A 5.25mm barrel burr was used to create a flat anterior and lateral aspect of the acromion, converting it from a Type 2 to a Type 1 acromion. Care was made to keep the deltoid fascia intact.  The  supraspinatus/anterior infraspinatus tear was visualized and decision was made to proceed with a mini-open approach.  Fluid was evacuated from the shoulder.  A longitudinal incision from the anterolateral acromion ~6cm in length was made overlying the raphe between the anterior and middle heads of the deltoid.  This incision also incorporated the anterolateral portal.  The raphe was identified and it was incised. The subacromial space was identified. Any remaining bursa was excised. The rotator cuff tear involving the supraspinatus and anterior part of the infraspinatus was identified. It was an L-shaped tear with the long limb of the L anterior.   We then turned our attention to the biceps tenodesis. The arm was externally rotated.  The bicipital groove was identified.  A 15 blade was used to make a cut overlying the biceps tendon, and the tendon was removed using a right angle clamp.  The base of the bicipital groove was identified and cleared of soft tissue.  A FiberTak anchor was placed in the bicipital groove.  The biceps tendon was held at the appropriate amount of tension.  One set of sutures was passed through the biceps anchor with one limb passed in a simple fashion and the second limb passed in a simple plus locking stitch pattern.  This was repeated for the other set of sutures.  This construct allowed for shuttling the biceps tendon down to the bone.  The sutures were tied and cut.  The diseased portion of the proximal biceps was then excised.  The arm was then internally rotated.  The rotator cuff footprint was cleared of soft tissue. The footprint was smoothed and a rongeur was used to create a smooth, bleeding bed.  Given the extent of the tear from anterior to posterior, three Iconix SPEED anchors were  placed just lateral to the articular margin.  However the posterior anchor did not achieve appropriate fixation so the sutures from the speed anchor were passed through an Arthrex SwiveLock  anchor and this was placed as the medial row anchor posteriorly.  The rotator cuff was held in a reduced position and all limbs of suture were passed appropriately with a suture passer.  The arm was abducted slightly.  Two SwiveLock anchors were placed for the lateral row anchors with one limb of each of the 6 medial row sutures passed through each anchor.  The knotless mechanism from the posterior SwiveLock anchor was utilized to reduce the dogear.  This construct allowed for excellent reapproximation and compression of the rotator cuff over its footprint. The construct was stable with external and internal rotation.  The wound was thoroughly irrigated.  The deltoid split was closed with 0 Vicryl.  The subdermal layer was closed with 2-0 Vicryl.  The skin was closed with 4-0 Monocryl and Dermabond. The portals were closed with 3-0 Nylon. Xeroform was applied to the incisions. A sterile dressing was applied, followed by a Polar Care sleeve and a SlingShot shoulder immobilizer/sling. The patient was awakened from anesthesia without difficulty and was transferred to the PACU in stable condition.   Of note, assistance from a PA was essential to performing the surgery. PA assisted with patient positioning, retraction, and instrumentation. The surgery would have been more difficult and had longer operative time without PA assistance.    COMPLICATIONS: none  DISPOSITION: plan for discharge home after recovery in PACU   POSTOPERATIVE PLAN: Remain in sling (except hygiene and elbow/wrist/hand RoM exercises as instructed by PT) x 6 weeks and NWB for this time. PT to begin 3-4 days after surgery.  Use large rotator cuff repair rehab protocol with subscapularis repair.  ASA 325mg  daily x 2 weeks for DVT ppx.

## 2021-05-31 NOTE — Progress Notes (Signed)
Assisted Ryan Bialas ANMD with left, ultrasound guided, interscalene  block. Side rails up, monitors on throughout procedure. See vital signs in flow sheet. Tolerated Procedure well. °

## 2021-05-31 NOTE — Anesthesia Preprocedure Evaluation (Addendum)
Anesthesia Evaluation  Patient identified by MRN, date of birth, ID band Patient awake    Reviewed: Allergy & Precautions, NPO status , Patient's Chart, lab work & pertinent test results, reviewed documented beta blocker date and time   History of Anesthesia Complications (+) PROLONGED EMERGENCE and history of anesthetic complications  Airway Mallampati: II  TM Distance: >3 FB Neck ROM: Full    Dental  (+) Upper Dentures, Lower Dentures   Pulmonary former smoker,    Pulmonary exam normal        Cardiovascular hypertension, + CAD, + Past MI and + CABG (x3 vessel in 2017)  Normal cardiovascular exam     Neuro/Psych  Headaches, Anxiety    GI/Hepatic GERD  Controlled,  Endo/Other  diabetes, Type 2  Renal/GU CRFRenal disease     Musculoskeletal  (+) Arthritis , Tear of left rotator cuff   Abdominal Normal abdominal exam  (+)   Peds  Hematology negative hematology ROS (+)   Anesthesia Other Findings   Reproductive/Obstetrics                            Anesthesia Physical Anesthesia Plan  ASA: 3  Anesthesia Plan: General and Regional   Post-op Pain Management: GA combined w/ Regional for post-op pain   Induction: Intravenous  PONV Risk Score and Plan: 2 and Ondansetron, Dexamethasone, Treatment may vary due to age or medical condition and Midazolam  Airway Management Planned: LMA  Additional Equipment: None  Intra-op Plan:   Post-operative Plan: Extubation in OR  Informed Consent: I have reviewed the patients History and Physical, chart, labs and discussed the procedure including the risks, benefits and alternatives for the proposed anesthesia with the patient or authorized representative who has indicated his/her understanding and acceptance.     Dental advisory given  Plan Discussed with: CRNA  Anesthesia Plan Comments: (Interscalene nerve block with exparel for postoperative  pain control. GA with LMA)       Anesthesia Quick Evaluation

## 2021-05-31 NOTE — H&P (Signed)
Paper H&P to be scanned into permanent record. H&P reviewed. No significant changes noted.  

## 2021-05-31 NOTE — Transfer of Care (Signed)
Immediate Anesthesia Transfer of Care Note  Patient: Tyler Russell  Procedure(s) Performed: Left shoulder arthroscopic subscapularis repair, mini-open rotator cuff repair, subacromial decompression, and biceps tenodesis (Left: Shoulder)  Patient Location: PACU  Anesthesia Type: General, Regional  Level of Consciousness: awake, alert  and patient cooperative  Airway and Oxygen Therapy: Patient Spontanous Breathing and Patient connected to supplemental oxygen  Post-op Assessment: Post-op Vital signs reviewed, Patient's Cardiovascular Status Stable, Respiratory Function Stable, Patent Airway and No signs of Nausea or vomiting  Post-op Vital Signs: Reviewed and stable  Complications: No notable events documented.

## 2021-05-31 NOTE — Discharge Instructions (Addendum)
Post-Op Instructions - Rotator Cuff Repair  1. Bracing: You will wear a shoulder immobilizer or sling for 6 weeks.   2. Driving: No driving for 3 weeks post-op. When driving, do not wear the immobilizer. Ideally, we recommend no driving for 6 weeks while sling is in place as one arm will be immobilized.   3. Activity: No active lifting for 2 months. Wrist, hand, and elbow motion only. Avoid lifting the upper arm away from the body except for hygiene. You are permitted to bend and straighten the elbow passively only (no active elbow motion). You may use your hand and wrist for typing, writing, and managing utensils (cutting food). Do not lift more than a coffee cup for 8 weeks.  When sleeping or resting, inclined positions (recliner chair or wedge pillow) and a pillow under the forearm for support may provide better comfort for up to 4 weeks.  Avoid long distance travel for 4 weeks.  Return to normal activities after rotator cuff repair repair normally takes 6 months on average. If rehab goes very well, may be able to do most activities at 4 months, except overhead or contact sports.  4. Physical Therapy: Begins 3-4 days after surgery, and proceed 1 time per week for the first 6 weeks, then 1-2 times per week from weeks 6-20 post-op.  5. Medications:  - You will be provided a prescription for narcotic pain medicine. After surgery, take 1-2 narcotic tablets every 4 hours if needed for severe pain.  - A prescription for anti-nausea medication will be provided in case the narcotic medicine causes nausea - take 1 tablet every 6 hours only if nauseated.   - Take tylenol 1000 mg (2 Extra Strength tablets or 3 regular strength) every 8 hours for pain.  May decrease or stop tylenol 5 days after surgery if you are having minimal pain. - Take ASA 325mg/day x 2 weeks to help prevent DVTs/PEs (blood clots).  - DO NOT take ANY nonsteroidal anti-inflammatory pain medications (Advil, Motrin, Ibuprofen, Aleve,  Naproxen, or Naprosyn). These medicines can inhibit healing of your shoulder repair.    If you are taking prescription medication for anxiety, depression, insomnia, muscle spasm, chronic pain, or for attention deficit disorder, you are advised that you are at a higher risk of adverse effects with use of narcotics post-op, including narcotic addiction/dependence, depressed breathing, death. If you use non-prescribed substances: alcohol, marijuana, cocaine, heroin, methamphetamines, etc., you are at a higher risk of adverse effects with use of narcotics post-op, including narcotic addiction/dependence, depressed breathing, death. You are advised that taking > 50 morphine milligram equivalents (MME) of narcotic pain medication per day results in twice the risk of overdose or death. For your prescription provided: oxycodone 5 mg - taking more than 6 tablets per day would result in > 50 morphine milligram equivalents (MME) of narcotic pain medication. Be advised that we will prescribe narcotics short-term, for acute post-operative pain only - 3 weeks for major operations such as shoulder repair/reconstruction surgeries.     6. Post-Op Appointment:  Your first post-op appointment will be 10-14 days post-op.  7. Work or School: For most, but not all procedures, we advise staying out of work or school for at least 1 to 2 weeks in order to recover from the stress of surgery and to allow time for healing.   If you need a work or school note this can be provided.   8. Smoking: If you are a smoker, you need to refrain from   smoking in the postoperative period. The nicotine in cigarettes will inhibit healing of your shoulder repair and decrease the chance of successful repair. Similarly, nicotine containing products (gum, patches) should be avoided.   Post-operative Brace: Apply and remove the brace you received as you were instructed to at the time of fitting and as described in detail as the brace's  instructions for use indicate.  Wear the brace for the period of time prescribed by your physician.  The brace can be cleaned with soap and water and allowed to air dry only.  Should the brace result in increased pain, decreased feeling (numbness/tingling), increased swelling or an overall worsening of your medical condition, please contact your doctor immediately.  If an emergency situation occurs as a result of wearing the brace after normal business hours, please dial 911 and seek immediate medical attention.  Let your doctor know if you have any further questions about the brace issued to you. Refer to the shoulder sling instructions for use if you have any questions regarding the correct fit of your shoulder sling.  [facility] Customer Care for Troubleshooting: [phone]  Video that illustrates how to properly use a shoulder sling: "Instructions for Proper Use of an Orthopaedic Sling" https://www.youtube.com/watch?v=AHZpn_Xo45w        Information for Discharge Teaching: EXPAREL (bupivacaine liposome injectable suspension)   Your surgeon or anesthesiologist gave you EXPAREL(bupivacaine) to help control your pain after surgery.  EXPAREL is a local anesthetic that provides pain relief by numbing the tissue around the surgical site. EXPAREL is designed to release pain medication over time and can control pain for up to 72 hours. Depending on how you respond to EXPAREL, you may require less pain medication during your recovery.  Possible side effects: Temporary loss of sensation or ability to move in the area where bupivacaine was injected. Nausea, vomiting, constipation Rarely, numbness and tingling in your mouth or lips, lightheadedness, or anxiety may occur. Call your doctor right away if you think you may be experiencing any of these sensations, or if you have other questions regarding possible side effects.  Follow all other discharge instructions given to you by your surgeon or nurse.  Eat a healthy diet and drink plenty of water or other fluids.  If you return to the hospital for any reason within 96 hours following the administration of EXPAREL, it is important for health care providers to know that you have received this anesthetic. A teal colored band has been placed on your arm with the date, time and amount of EXPAREL you have received in order to alert and inform your health care providers. Please leave this armband in place for the full 96 hours following administration, and then you may remove the band.   PERIPHERAL NERVE BLOCK PATIENT INFORMATION  Your surgeon has requested a peripheral nerve block for your surgery. This anesthetic technique provides excellent post-operative pain relief for you in a safe and effective manner. It will also help reduce the risk of nausea and vomiting and allow earlier discharge from the hospital.   The block is performed under sedation with ultrasound guidance prior to your procedure. Due to the sedation, your may or may not remember the block experience. The nerve block will begin to take effect anywhere from 5 to 30 minutes after being administered. You will be transported to the operating room from your surgery after the block is completed.   At the end of surgery, when the anesthesia wears off, you will notice a few things.   Your may not be able to move or feel the part of your body targeted by the nerve block. These are normal experiences, and they will disappear as the block wears off.  If you had an interscalene nerve block performed (which is common for shoulder surgery), your voice can be very hoarse and you may feel that you are not able to take as deep a breath as you did before surgery. Some patients may also notice a droopy eyelid on the affected side. These symptoms will resolve once the block wears off.  Pain control: The nerve block technique used is a single injection that can last anywhere from 1-3 days. The duration of the  numbness can vary between individuals. After leaving the hospital, it is important that you begin to take your prescribed pain medication when you start to sense the nerve block wearing off. This will help you avoid unpleasant pain at the time the nerve block wears off, which can sometimes be in the middle of the night. The block will only cover pain in the areas targeted by the nerve block so if you experience surgical pain outside of that area, please take your prescribed pain medication. Management of the "numb area": After a nerve block, you cannot feel pain, pressure, or temperature in the affected area so there is an increased risk for injury. You should take extra care to protect the affected areas until sensation and movement returns. Please take caution to not come in contact with extremely hot or cold items because you will not be able to sense or protect yourself form the extremes of temperature.  You may experience some persistent numbness after the procedure by most neurological deficits resolve over time and the incidence of serious long term neurological complications attributable to peripheral nerve blocks are relatively uncommon.    

## 2021-06-03 ENCOUNTER — Encounter: Payer: Self-pay | Admitting: Orthopedic Surgery

## 2022-01-26 ENCOUNTER — Inpatient Hospital Stay
Admission: EM | Admit: 2022-01-26 | Discharge: 2022-01-29 | DRG: 038 | Disposition: A | Discharge status: Home / Self Care | Payer: Medicare PPO | Attending: Internal Medicine | Admitting: Internal Medicine

## 2022-01-26 ENCOUNTER — Inpatient Hospital Stay: Payer: Medicare PPO

## 2022-01-26 ENCOUNTER — Emergency Department: Payer: Medicare PPO

## 2022-01-26 ENCOUNTER — Other Ambulatory Visit: Payer: Self-pay

## 2022-01-26 ENCOUNTER — Encounter: Payer: Self-pay | Admitting: Emergency Medicine

## 2022-01-26 DIAGNOSIS — Z87891 Personal history of nicotine dependence: Secondary | ICD-10-CM

## 2022-01-26 DIAGNOSIS — I639 Cerebral infarction, unspecified: Secondary | ICD-10-CM | POA: Diagnosis present

## 2022-01-26 DIAGNOSIS — I10 Essential (primary) hypertension: Secondary | ICD-10-CM | POA: Diagnosis present

## 2022-01-26 DIAGNOSIS — Z8673 Personal history of transient ischemic attack (TIA), and cerebral infarction without residual deficits: Secondary | ICD-10-CM

## 2022-01-26 DIAGNOSIS — F411 Generalized anxiety disorder: Secondary | ICD-10-CM | POA: Diagnosis present

## 2022-01-26 DIAGNOSIS — D649 Anemia, unspecified: Secondary | ICD-10-CM | POA: Diagnosis present

## 2022-01-26 DIAGNOSIS — I6522 Occlusion and stenosis of left carotid artery: Principal | ICD-10-CM | POA: Diagnosis present

## 2022-01-26 DIAGNOSIS — D72829 Elevated white blood cell count, unspecified: Secondary | ICD-10-CM | POA: Diagnosis present

## 2022-01-26 DIAGNOSIS — Z800 Family history of malignant neoplasm of digestive organs: Secondary | ICD-10-CM

## 2022-01-26 DIAGNOSIS — E1165 Type 2 diabetes mellitus with hyperglycemia: Secondary | ICD-10-CM | POA: Diagnosis present

## 2022-01-26 DIAGNOSIS — Z8249 Family history of ischemic heart disease and other diseases of the circulatory system: Secondary | ICD-10-CM | POA: Diagnosis not present

## 2022-01-26 DIAGNOSIS — I252 Old myocardial infarction: Secondary | ICD-10-CM

## 2022-01-26 DIAGNOSIS — Z7984 Long term (current) use of oral hypoglycemic drugs: Secondary | ICD-10-CM

## 2022-01-26 DIAGNOSIS — H53462 Homonymous bilateral field defects, left side: Secondary | ICD-10-CM | POA: Diagnosis present

## 2022-01-26 DIAGNOSIS — K219 Gastro-esophageal reflux disease without esophagitis: Secondary | ICD-10-CM | POA: Diagnosis present

## 2022-01-26 DIAGNOSIS — E1169 Type 2 diabetes mellitus with other specified complication: Secondary | ICD-10-CM

## 2022-01-26 DIAGNOSIS — E782 Mixed hyperlipidemia: Secondary | ICD-10-CM | POA: Diagnosis present

## 2022-01-26 DIAGNOSIS — Z981 Arthrodesis status: Secondary | ICD-10-CM

## 2022-01-26 DIAGNOSIS — Z79899 Other long term (current) drug therapy: Secondary | ICD-10-CM | POA: Diagnosis not present

## 2022-01-26 DIAGNOSIS — I6340 Cerebral infarction due to embolism of unspecified cerebral artery: Secondary | ICD-10-CM | POA: Diagnosis not present

## 2022-01-26 DIAGNOSIS — H34232 Retinal artery branch occlusion, left eye: Secondary | ICD-10-CM | POA: Diagnosis present

## 2022-01-26 DIAGNOSIS — H5462 Unqualified visual loss, left eye, normal vision right eye: Secondary | ICD-10-CM

## 2022-01-26 DIAGNOSIS — E119 Type 2 diabetes mellitus without complications: Secondary | ICD-10-CM | POA: Diagnosis present

## 2022-01-26 DIAGNOSIS — Z951 Presence of aortocoronary bypass graft: Secondary | ICD-10-CM

## 2022-01-26 DIAGNOSIS — I2510 Atherosclerotic heart disease of native coronary artery without angina pectoris: Secondary | ICD-10-CM | POA: Diagnosis present

## 2022-01-26 LAB — URINE DRUG SCREEN, QUALITATIVE (ARMC ONLY)
Amphetamines, Ur Screen: NOT DETECTED
Barbiturates, Ur Screen: NOT DETECTED
Benzodiazepine, Ur Scrn: NOT DETECTED
Cannabinoid 50 Ng, Ur ~~LOC~~: NOT DETECTED
Cocaine Metabolite,Ur ~~LOC~~: NOT DETECTED
MDMA (Ecstasy)Ur Screen: NOT DETECTED
Methadone Scn, Ur: NOT DETECTED
Opiate, Ur Screen: NOT DETECTED
Phencyclidine (PCP) Ur S: NOT DETECTED
Tricyclic, Ur Screen: NOT DETECTED

## 2022-01-26 LAB — HEMOGLOBIN A1C
Hgb A1c MFr Bld: 7.5 % — ABNORMAL HIGH (ref 4.8–5.6)
Mean Plasma Glucose: 168.55 mg/dL

## 2022-01-26 LAB — COMPREHENSIVE METABOLIC PANEL
ALT: 37 U/L (ref 0–44)
AST: 29 U/L (ref 15–41)
Albumin: 4.2 g/dL (ref 3.5–5.0)
Alkaline Phosphatase: 71 U/L (ref 38–126)
Anion gap: 6 (ref 5–15)
BUN: 14 mg/dL (ref 8–23)
CO2: 24 mmol/L (ref 22–32)
Calcium: 8.9 mg/dL (ref 8.9–10.3)
Chloride: 107 mmol/L (ref 98–111)
Creatinine, Ser: 0.88 mg/dL (ref 0.61–1.24)
GFR, Estimated: >60 mL/min (ref >60)
Glucose, Bld: 141 mg/dL — ABNORMAL HIGH (ref 70–99)
Potassium: 3.7 mmol/L (ref 3.5–5.1)
Sodium: 137 mmol/L (ref 135–145)
Total Bilirubin: 0.6 mg/dL (ref 0.3–1.2)
Total Protein: 7.1 g/dL (ref 6.5–8.1)

## 2022-01-26 LAB — URINALYSIS, ROUTINE W REFLEX MICROSCOPIC
Bilirubin Urine: NEGATIVE
Glucose, UA: NEGATIVE mg/dL
Hgb urine dipstick: NEGATIVE
Ketones, ur: NEGATIVE mg/dL
Leukocytes,Ua: NEGATIVE
Nitrite: NEGATIVE
Protein, ur: NEGATIVE mg/dL
Specific Gravity, Urine: 1.018 (ref 1.005–1.030)
pH: 7 (ref 5.0–8.0)

## 2022-01-26 LAB — GLUCOSE, CAPILLARY: Glucose-Capillary: 116 mg/dL — ABNORMAL HIGH (ref 70–99)

## 2022-01-26 LAB — CBC
HCT: 38.3 % — ABNORMAL LOW (ref 39.0–52.0)
Hemoglobin: 12.4 g/dL — ABNORMAL LOW (ref 13.0–17.0)
MCH: 27.1 pg (ref 26.0–34.0)
MCHC: 32.4 g/dL (ref 30.0–36.0)
MCV: 83.6 fL (ref 80.0–100.0)
Platelets: 293 10*3/uL (ref 150–400)
RBC: 4.58 MIL/uL (ref 4.22–5.81)
RDW: 14 % (ref 11.5–15.5)
WBC: 7.6 10*3/uL (ref 4.0–10.5)
nRBC: 0 % (ref 0.0–0.2)

## 2022-01-26 LAB — DIFFERENTIAL
Abs Immature Granulocytes: 0.02 10*3/uL (ref 0.00–0.07)
Basophils Absolute: 0.1 10*3/uL (ref 0.0–0.1)
Basophils Relative: 1 %
Eosinophils Absolute: 0.1 10*3/uL (ref 0.0–0.5)
Eosinophils Relative: 2 %
Immature Granulocytes: 0 %
Lymphocytes Relative: 40 %
Lymphs Abs: 3.1 10*3/uL (ref 0.7–4.0)
Monocytes Absolute: 0.7 10*3/uL (ref 0.1–1.0)
Monocytes Relative: 9 %
Neutro Abs: 3.7 10*3/uL (ref 1.7–7.7)
Neutrophils Relative %: 48 %

## 2022-01-26 LAB — PROTIME-INR
INR: 1 (ref 0.8–1.2)
Prothrombin Time: 13.3 seconds (ref 11.4–15.2)

## 2022-01-26 LAB — ETHANOL: Alcohol, Ethyl (B): <10 mg/dL (ref <10)

## 2022-01-26 LAB — APTT: aPTT: 30 seconds (ref 24–36)

## 2022-01-26 MED ORDER — IOHEXOL 350 MG/ML SOLN
75.0000 mL | Freq: Once | INTRAVENOUS | Status: AC | PRN
  Administered 2022-01-26: 75 mL via INTRAVENOUS

## 2022-01-26 MED ORDER — PRAVASTATIN SODIUM 20 MG PO TABS: 10.0000 mg | ORAL_TABLET | Freq: Every day | ORAL | Status: DC

## 2022-01-26 MED ORDER — ASPIRIN EC 81 MG PO TBEC
81.0000 mg | DELAYED_RELEASE_TABLET | Freq: Every day | ORAL | Status: DC
  Administered 2022-01-26 – 2022-01-29 (×3): 81 mg via ORAL
  Filled 2022-01-26 (×3): qty 1

## 2022-01-26 MED ORDER — ASPIRIN 325 MG PO TABS
325.0000 mg | ORAL_TABLET | Freq: Every day | ORAL | Status: DC
Start: 2022-01-26 — End: 2022-01-26

## 2022-01-26 MED ORDER — METFORMIN HCL 850 MG PO TABS: 850.0000 mg | ORAL_TABLET | Freq: Two times a day (BID) | ORAL | Status: DC

## 2022-01-26 MED ORDER — ACETAMINOPHEN 650 MG RE SUPP: 650.0000 mg | RECTAL | Status: DC | PRN

## 2022-01-26 MED ORDER — SODIUM CHLORIDE 0.9 % IV SOLN: INTRAVENOUS | Status: DC

## 2022-01-26 MED ORDER — ENOXAPARIN SODIUM 40 MG/0.4ML IJ SOSY
40.0000 mg | PREFILLED_SYRINGE | INTRAMUSCULAR | Status: DC
  Administered 2022-01-26 – 2022-01-28 (×2): 40 mg via SUBCUTANEOUS
  Filled 2022-01-26 (×2): qty 0.4

## 2022-01-26 MED ORDER — SENNOSIDES-DOCUSATE SODIUM 8.6-50 MG PO TABS
1.0000 | ORAL_TABLET | Freq: Every day | ORAL | Status: DC
  Administered 2022-01-26 – 2022-01-28 (×3): 1 via ORAL
  Filled 2022-01-26 (×3): qty 1

## 2022-01-26 MED ORDER — ACETAMINOPHEN 500 MG PO TABS
1000.0000 mg | ORAL_TABLET | Freq: Three times a day (TID) | ORAL | Status: DC | PRN
  Administered 2022-01-26: 1000 mg via ORAL
  Filled 2022-01-26 (×2): qty 2

## 2022-01-26 MED ORDER — ACETAMINOPHEN 160 MG/5ML PO SOLN
650.0000 mg | ORAL | Status: DC | PRN
  Filled 2022-01-26: qty 20.3

## 2022-01-26 MED ORDER — CLOPIDOGREL BISULFATE 75 MG PO TABS
75.0000 mg | ORAL_TABLET | Freq: Every day | ORAL | Status: DC
  Administered 2022-01-26 – 2022-01-27 (×2): 75 mg via ORAL
  Filled 2022-01-26 (×2): qty 1

## 2022-01-26 MED ORDER — DAPAGLIFLOZIN PROPANEDIOL 10 MG PO TABS
10.0000 mg | ORAL_TABLET | Freq: Every day | ORAL | Status: DC
  Administered 2022-01-26 – 2022-01-29 (×3): 10 mg via ORAL
  Filled 2022-01-26 (×5): qty 1

## 2022-01-26 MED ORDER — GEMFIBROZIL 600 MG PO TABS
600.0000 mg | ORAL_TABLET | Freq: Two times a day (BID) | ORAL | Status: DC
  Administered 2022-01-26 – 2022-01-29 (×4): 600 mg via ORAL
  Filled 2022-01-26 (×8): qty 1

## 2022-01-26 MED ORDER — STROKE: EARLY STAGES OF RECOVERY BOOK: Freq: Once | Status: AC

## 2022-01-26 MED ORDER — PANTOPRAZOLE SODIUM 20 MG PO TBEC
20.0000 mg | DELAYED_RELEASE_TABLET | Freq: Every day | ORAL | Status: DC
  Administered 2022-01-26 – 2022-01-29 (×3): 20 mg via ORAL
  Filled 2022-01-26 (×4): qty 1

## 2022-01-26 MED ORDER — METOPROLOL TARTRATE 50 MG PO TABS
50.0000 mg | ORAL_TABLET | Freq: Two times a day (BID) | ORAL | Status: DC
  Administered 2022-01-26 – 2022-01-29 (×5): 50 mg via ORAL
  Filled 2022-01-26 (×6): qty 1

## 2022-01-26 MED ORDER — ROSUVASTATIN CALCIUM 10 MG PO TABS
40.0000 mg | ORAL_TABLET | Freq: Every day | ORAL | Status: DC
  Administered 2022-01-26 – 2022-01-29 (×4): 40 mg via ORAL
  Filled 2022-01-26 (×2): qty 2
  Filled 2022-01-26 (×2): qty 4

## 2022-01-26 MED ORDER — LISINOPRIL 5 MG PO TABS
2.5000 mg | ORAL_TABLET | Freq: Every day | ORAL | Status: DC
  Administered 2022-01-26 – 2022-01-29 (×4): 2.5 mg via ORAL
  Filled 2022-01-26 (×4): qty 1

## 2022-01-26 MED ORDER — AMLODIPINE BESYLATE 10 MG PO TABS
10.0000 mg | ORAL_TABLET | Freq: Every day | ORAL | Status: DC
  Administered 2022-01-26 – 2022-01-29 (×4): 10 mg via ORAL
  Filled 2022-01-26: qty 2
  Filled 2022-01-26 (×2): qty 1
  Filled 2022-01-26: qty 2

## 2022-01-26 MED ORDER — ACETAMINOPHEN 325 MG PO TABS: 650.0000 mg | ORAL_TABLET | ORAL | Status: DC | PRN

## 2022-01-26 MED ORDER — ESCITALOPRAM OXALATE 10 MG PO TABS
10.0000 mg | ORAL_TABLET | Freq: Every day | ORAL | Status: DC
  Administered 2022-01-26 – 2022-01-29 (×3): 10 mg via ORAL
  Filled 2022-01-26 (×5): qty 1

## 2022-01-26 MED ORDER — LORATADINE 10 MG PO TABS
10.0000 mg | ORAL_TABLET | Freq: Every day | ORAL | Status: DC | PRN
Start: 2022-01-26 — End: 2022-01-29

## 2022-01-26 MED ORDER — CLOPIDOGREL BISULFATE 75 MG PO TABS
225.0000 mg | ORAL_TABLET | Freq: Once | ORAL | Status: AC
  Administered 2022-01-26: 225 mg via ORAL
  Filled 2022-01-26: qty 3

## 2022-01-26 NOTE — Progress Notes (Addendum)
SLP Cancellation Note ? ?Patient Details ?Name: Tyler Russell ?MRN: [id number] ?DOB: [birth year] ? ? ?Cancelled treatment:       Reason Eval/Treat Not Completed: Patient at procedure or test/unavailable  ? ?SLP consult received and appreciated. Chart review completed. Pt OTF for STAT MRI. Will continue efforts as appropriate. ? ?Clyde Canterbury, M.S., CCC-SLP ?Speech-Language Pathologist ?[place] [facility] ?[phone])  ? ?[name] ?01/26/2022, 12:22 PM ?

## 2022-01-26 NOTE — Consult Note (Signed)
Neurology Consultation ?Reason for Consult: BRAO ?Requesting Physician: Vladimir Crofts ? ?CC: Left eye partial vision loss  ? ?History is obtained from:  ? ?HPI: Tyler Russell is a 66 y.o. male with a past medical history significant for hypertension, hyperlipidemia, diabetes, coronary artery disease, chronic kidney disease, alcohol abuse (denies current heavy alcohol use). ? ?He reports he has been in his normal state of health, denies any transient neurological symptoms although his wife reports that he sometimes has word finding difficulties this is a longstanding problem.  He reports he was in the shower when he suddenly noticed some difficulty with his vision in the left eye, the evening of 5/13.  Given it was still present in the morning when he woke up he came to the ED for evaluation.  Ophthalmology consultation was obtained and revealed branch retinal artery occlusion. ? ?On review of systems wife additionally reports that the patient snores and sometimes has witnessed apneas at night.  Patient endorses he does sometimes wake up gasping for breath ? ? ?LKW: 5/13 evening ?tPA given?: No, out of the window ?Premorbid modified rankin scale:  ?    0 - No symptoms. ? ? ?ROS: All other review of systems was negative except as noted in the HPI.  ? ?Past Medical History:  ?Diagnosis Date  ? Alcohol abuse   ? Arthritis   ? Chronic back pain   ? Chronic kidney disease   ? Complication of anesthesia   ? had difficulty with breathing, had difficulty waking up  ? Coronary artery disease   ? Diabetes mellitus without complication ([place])   ? type 2  ? GERD (gastroesophageal reflux disease)   ? Gonorrhea   ? Headache(784.0)   ? History of kidney stones   ? Hypercholesteremia   ? no medication at this time  ? Hypertension   ? Trigger finger   ? on both hands  ? Ulcer   ? on medication  ? ?Past Surgical History:  ?Procedure Laterality Date  ? ANTERIOR LAT LUMBAR FUSION  08/28/2011  ? Procedure: ANTERIOR LATERAL LUMBAR FUSION 1  LEVEL;  Surgeon: Dahlia Bailiff;  Location: [place];  Service: Orthopedics;  Laterality: N/A;  Lateral L3-4 Fusion Posterior Spinal Fusion L3-4, Possible Removal of Hardware   ? CARDIAC CATHETERIZATION N/A 02/27/2016  ? Procedure: Left Heart Cath and Coronary Angiography;  Surgeon: Corey Skains, MD;  Location: [facility];  Service: Cardiovascular;  Laterality: N/A;  ? CARPAL TUNNEL RELEASE Left 05/04/2019  ? CARPAL TUNNEL RELEASE Right 02/29/2020  ? CERVICAL DISC SURGERY    ? 3 ruptured disc, 2012  ? COLONOSCOPY  2017  ? CORONARY ARTERY BYPASS GRAFT N/A 02/29/2016  ? Procedure: CORONARY ARTERY BYPASS GRAFTING (CABG), ON PUMP, TIMES THREE, USING LEFT INTERNAL MAMMARY ARTERY, RIGHT GREATER SAPHENOUS VEIN HARVESTED ENDOSCOPICALLY;  Surgeon: Gaye Pollack, MD;  Location: [facility];  Service: Open Heart Surgery;  Laterality: N/A;  LIMA to LAD, SVG to RAMUS INTERMEDIATE, SVG to OM  ? ESOPHAGOGASTRODUODENOSCOPY (EGD) WITH PROPOFOL N/A 11/23/2015  ? Procedure: ESOPHAGOGASTRODUODENOSCOPY (EGD) WITH PROPOFOL;  Surgeon: Lollie Sails, MD;  Location: [facility];  Service: Endoscopy;  Laterality: N/A;  ? KNEE ARTHROSCOPY Left   ? LUMBAR FUSION  2008  ? SHOULDER ARTHROSCOPY WITH SUBACROMIAL DECOMPRESSION, ROTATOR CUFF REPAIR AND BICEP TENDON REPAIR Left 05/31/2021  ? Procedure: Left shoulder arthroscopic subscapularis repair, mini-open rotator cuff repair, subacromial decompression, and biceps tenodesis;  Surgeon: Leim Fabry, MD;  Location: [facility];  Service: Orthopedics;  Laterality: Left;  ? TEE WITHOUT CARDIOVERSION N/A 02/29/2016  ? Procedure: TRANSESOPHAGEAL ECHOCARDIOGRAM (TEE);  Surgeon: Gaye Pollack, MD;  Location: [place];  Service: Open Heart Surgery;  Laterality: N/A;  ? VASECTOMY  09/15/1981  ? ?Current Outpatient Medications  ?Medication Instructions  ? acetaminophen (TYLENOL) 1,000 mg, Oral, Every 8 hours  ? amLODipine (NORVASC) 10 mg, Oral, Daily  ? aspirin 81 mg, Oral, Daily  ? blood  glucose meter kit and supplies Dispense based on patient and insurance preference. Use up to four times daily as directed. (FOR ICD-9 250.00, 250.01).  ? brompheniramine-pseudoephedrine-DM 30-2-10 MG/5ML syrup 5 mLs, Oral, 4 times daily PRN  ? cetirizine (ZYRTEC) 10 mg, Oral, Daily  ? diphenhydrAMINE (BENADRYL) 25 mg, Oral, Every 6 hours PRN  ? escitalopram (LEXAPRO) 10 mg, Oral, Daily  ? fluticasone (FLONASE) 50 MCG/ACT nasal spray 2 sprays, Nasal, Daily PRN  ? gemfibrozil (LOPID) 600 mg, Oral, 2 times daily before meals  ? lisinopril (ZESTRIL) 2.5 mg, Oral, Daily  ? losartan (COZAAR) 100 mg, Daily  ? metFORMIN (GLUCOPHAGE) 850 mg, Oral, 2 times daily with meals  ? metoprolol tartrate (LOPRESSOR) 50 mg, Oral, 2 times daily  ? omeprazole (PRILOSEC) 20 MG capsule TAKE 1 CAPSULE BY MOUTH EVERY DAY  ? ondansetron (ZOFRAN ODT) 4 mg, Oral, Every 8 hours PRN  ? oxyCODONE (ROXICODONE) 5-10 mg, Oral, Every 4 hours PRN  ? pantoprazole (PROTONIX) 20 mg, Oral, Daily  ? pravastatin (PRAVACHOL) 10 mg, Oral, Daily at bedtime  ? ? ? ?Family History  ?Problem Relation Age of Onset  ? CAD Father   ? Stomach cancer Maternal Grandmother   ? ? ? ?Social History:  reports that he has quit smoking. His smoking use included cigarettes. He has never used smokeless tobacco. He reports current alcohol use. He reports that he does not use drugs.  He reports he drinks about 1 drink per week ? ? ?Exam: ?Current vital signs: ?BP 134/76 (BP Location: Right Arm)   Pulse 63   Temp 97.7 ?F (36.5 ?C)   Resp 19   Ht $R'5\' 5"'fG$  (1.651 m)   Wt 78 kg   SpO2 98%   BMI 28.62 kg/m?  ?Vital signs in last 24 hours: ?Temp:  [97.7 ?F (36.5 ?C)-98.9 ?F (37.2 ?C)] 97.7 ?F (36.5 ?C) (05/14 1648) ?Pulse Rate:  [56-64] 63 (05/14 1648) ?Resp:  [12-21] 19 (05/14 1648) ?BP: (114-146)/(57-76) 134/76 (05/14 1648) ?SpO2:  [96 %-99 %] 98 % (05/14 1648) ?Weight:  [78 kg] 78 kg (05/14 0823) ? ? ?Physical Exam  ?Constitutional: Appears well-developed and well-nourished.   ?Psych: Affect appropriate to situation, mildly anxious but cooperative ?Eyes: No scleral injection ?HENT: No oropharyngeal obstruction.  ?MSK: no joint deformities.  ?Cardiovascular: Normal rate and regular rhythm. Perfusing extremities well ?Respiratory: Effort normal, non-labored breathing ?GI: Soft.  No distension. There is no tenderness.  ?Skin: Warm dry and intact visible skin ? ?Neuro: ?Mental Status: ?Patient is awake, alert, oriented to person, place, month, year, and situation. ?Patient is able to give a clear and coherent history. 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 Pupils are equal, round, and reactive to light, I am unable to elicit a clear APD.   ?III,IV, VI: EOMI without ptosis or diploplia.  ?V: Facial sensation is symmetric to light touch ?VII: Facial movement is symmetric.  ?VIII: hearing is intact to voice ?X: Uvula  elevates symmetrically ?XI: Shoulder shrug is symmetric. ?XII: tongue is midline without atrophy or fasciculations.  ?Motor: ?Tone is normal. Bulk is normal. 5/5 strength was present in all four extremities.  ?Sensory: ?Sensation is symmetric to light touch in the arms and legs. ?Deep Tendon Reflexes: ?2+ and symmetric in the patellae, 1+ brachioradialis bilaterally.  ?Cerebellar: ?FNF and HKS are intact bilaterally ?Gait:  ?Deferred  ? ?NIHSS total 0 ? ?I have reviewed labs in epic and the results pertinent to this consultation are: ? ?Basic Metabolic Panel: ?Recent Labs  ?Lab 01/26/22 ?0834  ?NA 137  ?K 3.7  ?CL 107  ?CO2 24  ?GLUCOSE 141*  ?BUN 14  ?CREATININE 0.88  ?CALCIUM 8.9  ? ? ?CBC: ?Recent Labs  ?Lab 01/26/22 ?0834  ?WBC 7.6  ?NEUTROABS 3.7  ?HGB 12.4*  ?HCT 38.3*  ?MCV 83.6  ?PLT 293  ? ? ?Coagulation Studies: ?Recent Labs  ?  01/26/22 ?0834  ?LABPROT 13.3  ?INR 1.0  ?  ?Lab Results  ?Component Value Date  ? HGBA1C 7.5 (H) 01/26/2022  ? ?Lab Results  ?Component Value Date  ? CHOL  228 (H) 02/26/2016  ? HDL 41 02/26/2016  ? LDLCALC 158 (H) 02/26/2016  ? TRIG 147 02/26/2016  ? CHOLHDL 5.6 02/26/2016  ? ?Unresulted Labs (From admission, onward)  ? ?  Start     Ordered  ? 02/02/22 0500  Cr

## 2022-01-26 NOTE — Progress Notes (Signed)
SLP Cancellation Note ? ?Patient Details ?Name: Tyler Russell ?MRN: [id number] ?DOB: September 20, 1955 ? ? ?Cancelled treatment:       Reason Eval/Treat Not Completed: SLP screened, no needs identified, will sign off (Per chart review and discussion with PT, pt A&Ox4. Most recent NIH Stroke Scale = 0. No focal deficits related to speech, language, cognition, or swallowing appreciated. SLP to sign off as pt has no acute SLP needs at this time.) ? ?Cherrie Gauze, M.S., CCC-SLP ?Speech-Language Pathologist ?[facility] ?[phone] ([place]) ? ?Quintella Baton ?01/26/2022, 1:52 PM ?

## 2022-01-26 NOTE — Subjective & Objective (Signed)
Tyler Russell, a 66 y/o, with DM, HLD, HTN, CAD was in his usual health 01/25/22. He was active and mowed. AT 2200 while showering he noted visual loss left eye - a cludy change in vision. Due to persistent visual change he presented to [facility]ED for evaluation. ?

## 2022-01-26 NOTE — Progress Notes (Signed)
PT Cancellation Note ? ?Patient Details ?Name: Tyler Russell ?MRN: [id number] ?DOB: 1956-07-18 ? ? ?Cancelled Treatment:    Reason Eval/Treat Not Completed: PT screened, no needs identified, will sign off PT orders received, chart reviewed. Spoke with pt & visitor who decline any issues re: mobility or cognition/memory. Pt reports he's at his baseline in regards to mobility. PT to complete current orders, please re-consult if new needs arise. ? ?Lavone Nian, PT, DPT ?01/26/22, 1:40 PM ? ? ?[name] ?01/26/2022, 1:39 PM ?

## 2022-01-26 NOTE — Assessment & Plan Note (Addendum)
--  cont Lexapro ?

## 2022-01-26 NOTE — ED Triage Notes (Signed)
Pt reports mowed yesterday and while in the shower around 2200 last pm he lost vision in his left eye. Pt describes it as a thick cloud that he cannot see through. Pt reports felt like something got in his eye. Pt denies pain. Denies dizziness, no facial drooping noted. ? ?MD in room to assess pt.  ?

## 2022-01-26 NOTE — Assessment & Plan Note (Addendum)
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 ?--cont ASA, resume plavix after surgery ?--cont statin (increased to Crestor 40 mg) ?

## 2022-01-26 NOTE — Consult Note (Signed)
?  ?  ? ?VASCULAR SURGERY CONSULTATION ? ? ?Requested by:  ?Dr, Adella Hare [facility]) ? ?Reason for consultation: L vision loss, L ICA high grade stenosis  ? ? ?History of Present Illness  ? ?Tyler Russell is a 66 y.o. (1955-12-01) RHD male who presents with cc: loss of L eye vision.  Pt notes prior episodes of visual loss in L eye that have resolved with time.  Pt denies classic amarousis fugax.  Pt notes onset of visual loss yesterday evening initial described as clouding of vision.  This later settled into loss of superior visual fields in L eye.  Pt denies any aphasia or hemiplegia. ? 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  Pt had a CTA completed with demonstrated sub-total occlusion of L ICA. ? ?Past Medical History:  ?Diagnosis Date  ? Alcohol abuse   ? Arthritis   ? Chronic back pain   ? Chronic kidney disease   ? Complication of anesthesia   ? had difficulty with breathing, had difficulty waking up  ? Coronary artery disease   ? Diabetes mellitus without complication ([place])   ? type 2  ? GERD (gastroesophageal reflux disease)   ? Gonorrhea   ? Headache(784.0)   ? History of kidney stones   ? Hypercholesteremia   ? no medication at this time  ? Hypertension   ? Trigger finger   ? on both hands  ? Ulcer   ? on medication  ? ? ?Past Surgical History:  ?Procedure Laterality Date  ? ANTERIOR LAT LUMBAR FUSION  08/28/2011  ? Procedure: ANTERIOR LATERAL LUMBAR FUSION 1 LEVEL;  Surgeon: Dahlia Bailiff;  Location: [place];  Service: Orthopedics;  Laterality: N/A;  Lateral L3-4 Fusion Posterior Spinal Fusion L3-4, Possible Removal of Hardware   ? CARDIAC CATHETERIZATION N/A 02/27/2016  ? Procedure: Left Heart Cath and Coronary Angiography;  Surgeon: Corey Skains, MD;  Location: [facility];  Service: Cardiovascular;  Laterality: N/A;  ? CARPAL TUNNEL RELEASE Left 05/04/2019  ? CARPAL TUNNEL RELEASE Right 02/29/2020  ?  CERVICAL DISC SURGERY    ? 3 ruptured disc, 2012  ? COLONOSCOPY  2017  ? CORONARY ARTERY BYPASS GRAFT N/A 02/29/2016  ? Procedure: CORONARY ARTERY BYPASS GRAFTING (CABG), ON PUMP, TIMES THREE, USING LEFT INTERNAL MAMMARY ARTERY, RIGHT GREATER SAPHENOUS VEIN HARVESTED ENDOSCOPICALLY;  Surgeon: Gaye Pollack, MD;  Location: [facility];  Service: Open Heart Surgery;  Laterality: N/A;  LIMA to LAD, SVG to RAMUS INTERMEDIATE, SVG to OM  ? ESOPHAGOGASTRODUODENOSCOPY (EGD) WITH PROPOFOL N/A 11/23/2015  ? Procedure: ESOPHAGOGASTRODUODENOSCOPY (EGD) WITH PROPOFOL;  Surgeon: Lollie Sails, MD;  Location: [facility];  Service: Endoscopy;  Laterality: N/A;  ? KNEE ARTHROSCOPY Left   ? LUMBAR FUSION  2008  ? SHOULDER ARTHROSCOPY WITH SUBACROMIAL DECOMPRESSION, ROTATOR CUFF REPAIR AND BICEP TENDON REPAIR Left 05/31/2021  ? Procedure: Left shoulder arthroscopic subscapularis repair, mini-open rotator cuff repair, subacromial decompression, and biceps tenodesis;  Surgeon: Leim Fabry, MD;  Location: [place];  Service: Orthopedics;  Laterality: Left;  ? TEE WITHOUT CARDIOVERSION N/A 02/29/2016  ? Procedure: TRANSESOPHAGEAL ECHOCARDIOGRAM (TEE);  Surgeon: Gaye Pollack, MD;  Location: [place];  Service: Open Heart Surgery;  Laterality: N/A;  ? VASECTOMY  09/15/1981  ? ?  ?Social History  ? ?Socioeconomic History  ? Marital status: Married  ?  Spouse name: Not on file  ? Number of children: Not  on file  ? Years of education: Not on file  ? Highest education level: Not on file  ?Occupational History  ? Not on file  ?Tobacco Use  ? Smoking status: Former  ?  Types: Cigarettes  ? Smokeless tobacco: Never  ? Tobacco comments:  ?  stopped at age 24 years  ?Vaping Use  ? Vaping Use: Never used  ?Substance and Sexual Activity  ? Alcohol use: Yes  ?  Comment: occassional  ? Drug use: No  ? Sexual activity: Yes  ?Other Topics Concern  ? Not on file  ?Social History Narrative  ? Independent at baseline, lives with family   ? ?Social Determinants of Health  ? ?Financial Resource Strain: Not on file  ?Food Insecurity: Not on file  ?Transportation Needs: Not on file  ?Physical Activity: Not on file  ?Stress: Not on file  ?Social Connections: Not on file  ?Intimate Partner Violence: Not on file  ? ? ?Family History  ?Problem Relation Age of Onset  ? CAD Father   ? Stomach cancer Maternal Grandmother   ? ? ?Current Facility-Administered Medications  ?Medication Dose Route Frequency Provider Last Rate Last Admin  ? 0.9 %  sodium chloride infusion   Intravenous Continuous Norins, Heinz Knuckles, MD 75 mL/hr at 01/26/22 1524 New Bag at 01/26/22 1524  ? acetaminophen (TYLENOL) tablet 1,000 mg  1,000 mg Oral Q8H PRN Neena Rhymes, MD   1,000 mg at 01/26/22 1530  ? amLODipine (NORVASC) tablet 10 mg  10 mg Oral Daily Norins, Heinz Knuckles, MD   10 mg at 01/26/22 1502  ? aspirin EC tablet 81 mg  81 mg Oral Daily Neena Rhymes, MD   81 mg at 01/26/22 1501  ? clopidogrel (PLAVIX) tablet 75 mg  75 mg Oral Daily Norins, Heinz Knuckles, MD   75 mg at 01/26/22 1501  ? dapagliflozin propanediol (FARXIGA) tablet 10 mg  10 mg Oral Daily Norins, Heinz Knuckles, MD   10 mg at 01/26/22 1502  ? enoxaparin (LOVENOX) injection 40 mg  40 mg Subcutaneous Q24H Norins, Heinz Knuckles, MD      ? escitalopram (LEXAPRO) tablet 10 mg  10 mg Oral Daily Norins, Heinz Knuckles, MD   10 mg at 01/26/22 1502  ? gemfibrozil (LOPID) tablet 600 mg  600 mg Oral BID AC Norins, Heinz Knuckles, MD   600 mg at 01/26/22 1710  ? lisinopril (ZESTRIL) tablet 2.5 mg  2.5 mg Oral Daily Norins, Heinz Knuckles, MD   2.5 mg at 01/26/22 1501  ? loratadine (CLARITIN) tablet 10 mg  10 mg Oral Daily PRN Norins, Heinz Knuckles, MD      ? metoprolol tartrate (LOPRESSOR) tablet 50 mg  50 mg Oral BID Norins, Heinz Knuckles, MD      ? pantoprazole (PROTONIX) EC tablet 20 mg  20 mg Oral Daily Norins, Heinz Knuckles, MD   20 mg at 01/26/22 1502  ? rosuvastatin (CRESTOR) tablet 40 mg  40 mg Oral Daily Norins, Heinz Knuckles, MD   40 mg at 01/26/22 1501   ? senna-docusate (Senokot-S) tablet 1 tablet  1 tablet Oral QHS Norins, Heinz Knuckles, MD      ? ? ?Allergies  ?Allergen Reactions  ? Atorvastatin   ?  Other reaction(s): Muscle Pain  ? Vicodin [Hydrocodone-Acetaminophen] Itching  ? Lactalbumin   ?  Other reaction(s): Other (See Comments) ?Sneezing and post nasal drip  ? Milk-Related Compounds Other (See Comments)  ?  Sneezing and post nasal drip  ? ? ?  REVIEW OF SYSTEMS (negative unless checked):  ? ?Cardiac:  ?[]  Chest pain or chest pressure? ?[]  Shortness of breath upon activity? ?[]  Shortness of breath when lying flat? ?[]  Irregular heart rhythm? ? ?Vascular:  ?[]  Pain in calf, thigh, or hip brought on by walking? ?[]  Pain in feet at night that wakes you up from your sleep? ?[]  Blood clot in your veins? ?[]  Leg swelling? ? ?Pulmonary:  ?[]  Oxygen at home? ?[]  Productive cough? ?[]  Wheezing? ? ?Neurologic:  ?[]  Sudden weakness in arms or legs? ?[]  Sudden numbness in arms or legs? ?[]  Sudden onset of difficult speaking or slurred speech? ?[x]  Loss of vision in one eye? ?[]  Problems with dizziness? ? ?Gastrointestinal:  ?[]  Blood in stool? ?[]  Vomited blood? ? ?Genitourinary:  ?[]  Burning when urinating? ?[]  Blood in urine? ? ?Psychiatric:  ?[]  Major depression ? ?Hematologic:  ?[]  Bleeding problems? ?[]  Problems with blood clotting? ? ?Dermatologic:  ?[]  Rashes or ulcers? ? ?Constitutional:  ?[]  Fever or chills? ? ?Ear/Nose/Throat:  ?[]  Change in hearing? ?[]  Nose bleeds? ?[]  Sore throat? ? ?Musculoskeletal:  ?[]  Back pain? ?[]  Joint pain? ?[]  Muscle pain? ? ? ?Physical Examination  ?  ? ?Vitals:  ? 01/26/22 1130 01/26/22 1200 01/26/22 1232 01/26/22 1648  ?BP: 129/73 (!) 114/57 (!) 146/76 134/76  ?Pulse: (!) 59 (!) 58 60 63  ?Resp: 13 17 18 19   ?Temp: 98.5 ?F (36.9 ?C)  97.7 ?F (36.5 ?C) 97.7 ?F (36.5 ?C)  ?TempSrc: Oral     ?SpO2: 98% 97% 99% 98%  ?Weight:      ?Height:      ? ?Body mass index is 28.62 kg/m?. ? ?General Alert, O x 3, WD, NAD  ?Head [place]/AT,     ?Ear/Nose/ ?Throat Hearing grossly intact, nares without erythema or drainage, oropharynx without Erythema or Exudate, Mallampati score: 3,   ?Eyes Both pupils dilated , EOMI  ?Neck Supple, mid-line trachea,    ?Pulmonary

## 2022-01-26 NOTE — Consult Note (Signed)
Reason for Consult:painless vision loss of left eye ?Referring Physician: Dr. Vladimir Crofts ?Chief complaint: loss of vision of left eye ? ?HPI: Tyler Russell is an 66 y.o. male w/ PMHx of T2DM, HTN, CAD who presented to [facility] ED for sudden onset painless vision loss of the left eye. The episode started last night. Denies FOL, floaters, eye pain. Describes the vision loss as not being able to see anything in the upper part of his visual field from the left eye. ? ?Past Medical History:  ?Diagnosis Date  ? Alcohol abuse   ? Arthritis   ? Chronic back pain   ? Chronic kidney disease   ? Complication of anesthesia   ? had difficulty with breathing, had difficulty waking up  ? Coronary artery disease   ? Diabetes mellitus without complication ([place])   ? type 2  ? GERD (gastroesophageal reflux disease)   ? Gonorrhea   ? Headache(784.0)   ? History of kidney stones   ? Hypercholesteremia   ? no medication at this time  ? Hypertension   ? Trigger finger   ? on both hands  ? Ulcer   ? on medication  ? ? ?ROS - N/A ? ?Past Surgical History:  ?Procedure Laterality Date  ? ANTERIOR LAT LUMBAR FUSION  08/28/2011  ? Procedure: ANTERIOR LATERAL LUMBAR FUSION 1 LEVEL;  Surgeon: Dahlia Bailiff;  Location: [place];  Service: Orthopedics;  Laterality: N/A;  Lateral L3-4 Fusion Posterior Spinal Fusion L3-4, Possible Removal of Hardware   ? CARDIAC CATHETERIZATION N/A 02/27/2016  ? Procedure: Left Heart Cath and Coronary Angiography;  Surgeon: Corey Skains, MD;  Location: [place] [facility];  Service: Cardiovascular;  Laterality: N/A;  ? CARPAL TUNNEL RELEASE Left 05/04/2019  ? CARPAL TUNNEL RELEASE Right 02/29/2020  ? CERVICAL DISC SURGERY    ? 3 ruptured disc, 2012  ? COLONOSCOPY  2017  ? CORONARY ARTERY BYPASS GRAFT N/A 02/29/2016  ? Procedure: CORONARY ARTERY BYPASS GRAFTING (CABG), ON PUMP, TIMES THREE, USING LEFT INTERNAL MAMMARY ARTERY, RIGHT GREATER SAPHENOUS VEIN HARVESTED ENDOSCOPICALLY;  Surgeon: Gaye Pollack, MD;   Location: [place] OR;  Service: Open Heart Surgery;  Laterality: N/A;  LIMA to LAD, SVG to RAMUS INTERMEDIATE, SVG to OM  ? ESOPHAGOGASTRODUODENOSCOPY (EGD) WITH PROPOFOL N/A 11/23/2015  ? Procedure: ESOPHAGOGASTRODUODENOSCOPY (EGD) WITH PROPOFOL;  Surgeon: Lollie Sails, MD;  Location: [facility] ENDOSCOPY;  Service: Endoscopy;  Laterality: N/A;  ? KNEE ARTHROSCOPY Left   ? LUMBAR FUSION  2008  ? SHOULDER ARTHROSCOPY WITH SUBACROMIAL DECOMPRESSION, ROTATOR CUFF REPAIR AND BICEP TENDON REPAIR Left 05/31/2021  ? Procedure: Left shoulder arthroscopic subscapularis repair, mini-open rotator cuff repair, subacromial decompression, and biceps tenodesis;  Surgeon: Leim Fabry, MD;  Location: [place];  Service: Orthopedics;  Laterality: Left;  ? TEE WITHOUT CARDIOVERSION N/A 02/29/2016  ? Procedure: TRANSESOPHAGEAL ECHOCARDIOGRAM (TEE);  Surgeon: Gaye Pollack, MD;  Location: [place];  Service: Open Heart Surgery;  Laterality: N/A;  ? VASECTOMY  09/15/1981  ? ? ?Family History  ?Problem Relation Age of Onset  ? CAD Father   ? Stomach cancer Maternal Grandmother   ? ? ?Social History:  reports that he has quit smoking. His smoking use included cigarettes. He has never used smokeless tobacco. He reports current alcohol use. He reports that he does not use drugs. ? ?Allergies:  ?Allergies  ?Allergen Reactions  ? Atorvastatin   ?  Other reaction(s): Muscle Pain  ? Vicodin [Hydrocodone-Acetaminophen] Itching  ? Lactalbumin   ?  Other reaction(s): Other (See Comments) ?Sneezing and post nasal drip  ? Milk-Related Compounds Other (See Comments)  ?  Sneezing and post nasal drip  ? ? ?Prior to Admission medications   ?Medication Sig Start Date End Date Taking? Authorizing Provider  ?acetaminophen (TYLENOL) 500 MG tablet Take 2 tablets (1,000 mg total) by mouth every 8 (eight) hours. 05/31/21 05/31/22  Leim Fabry, MD  ?amLODipine (NORVASC) 10 MG tablet Take 10 mg by mouth daily.    [provider]  ?blood glucose meter  kit and supplies Dispense based on patient and insurance preference. Use up to four times daily as directed. (FOR ICD-9 250.00, 250.01). 03/04/16   Barrett, Erin R, PA-C  ?brompheniramine-pseudoephedrine-DM 30-2-10 MG/5ML syrup Take 5 mLs by mouth 4 (four) times daily as needed. ?Patient not taking: Reported on 05/24/2021 10/16/17   Ronnell Freshwater, NP  ?cetirizine (ZYRTEC) 10 MG tablet Take 10 mg by mouth daily.    [provider]  ?diphenhydrAMINE (BENADRYL) 25 MG tablet Take 25 mg by mouth every 6 (six) hours as needed.    [provider]  ?escitalopram (LEXAPRO) 10 MG tablet Take 1 tablet (10 mg total) by mouth daily. 12/03/17   Ronnell Freshwater, NP  ?gemfibrozil (LOPID) 600 MG tablet Take 600 mg by mouth 2 (two) times daily before a meal.    [provider]  ?lisinopril (ZESTRIL) 2.5 MG tablet Take 1 tablet (2.5 mg total) by mouth daily. 03/04/16   Barrett, Erin R, PA-C  ?losartan (COZAAR) 100 MG tablet Take 100 mg by mouth daily. ?Patient not taking: Reported on 05/24/2021    [provider]  ?metFORMIN (GLUCOPHAGE) 850 MG tablet Take 1 tablet (850 mg total) by mouth 2 (two) times daily with a meal. 03/04/16   Barrett, Erin R, PA-C  ?metoprolol (LOPRESSOR) 50 MG tablet Take 1 tablet (50 mg total) by mouth 2 (two) times daily. 03/04/16   Barrett, Erin R, PA-C  ?omeprazole (PRILOSEC) 20 MG capsule TAKE 1 CAPSULE BY MOUTH EVERY DAY ?Patient not taking: Reported on 05/24/2021 06/18/18   Ronnell Freshwater, NP  ?ondansetron (ZOFRAN ODT) 4 MG disintegrating tablet Take 1 tablet (4 mg total) by mouth every 8 (eight) hours as needed for nausea or vomiting. 05/31/21   Leim Fabry, MD  ?oxyCODONE (ROXICODONE) 5 MG immediate release tablet Take 1-2 tablets (5-10 mg total) by mouth every 4 (four) hours as needed (pain). 05/31/21 05/31/22  Leim Fabry, MD  ?pantoprazole (PROTONIX) 20 MG tablet Take 20 mg by mouth daily.    [provider]  ?pravastatin (PRAVACHOL) 10 MG tablet Take 10 mg by  mouth at bedtime.    [provider]  ? ? ?Results for orders placed or performed during the hospital encounter of 01/26/22 (from the past 48 hour(s))  ?Protime-INR     Status: None  ? Collection Time: 01/26/22  8:34 AM  ?Result Value Ref Range  ? Prothrombin Time 13.3 11.4 - 15.2 seconds  ? INR 1.0 0.8 - 1.2  ?  Comment: (NOTE) ?INR goal varies based on device and disease states. ?Performed at [facility], [place], ?[place] [address] ?  ?APTT     Status: None  ? Collection Time: 01/26/22  8:34 AM  ?Result Value Ref Range  ? aPTT 30 24 - 36 seconds  ?  Comment: Performed at [facility], [address] [place], [place] [address]  ?CBC     Status: Abnormal  ? Collection Time:  01/26/22  8:34 AM  ?Result Value Ref Range  ? WBC 7.6 4.0 - 10.5 K/uL  ? RBC 4.58 4.22 - 5.81 MIL/uL  ? Hemoglobin 12.4 (L) 13.0 - 17.0 g/dL  ? HCT 38.3 (L) 39.0 - 52.0 %  ? MCV 83.6 80.0 - 100.0 fL  ? MCH 27.1 26.0 - 34.0 pg  ? MCHC 32.4 30.0 - 36.0 g/dL  ? RDW 14.0 11.5 - 15.5 %  ? Platelets 293 150 - 400 K/uL  ? nRBC 0.0 0.0 - 0.2 %  ?  Comment: Performed at [facility], [address], [place], [place] [address]  ?Differential     Status: None  ? Collection Time: 01/26/22  8:34 AM  ?Result Value Ref Range  ? Neutrophils Relative % 48 %  ? Neutro Abs 3.7 1.7 - 7.7 K/uL  ? Lymphocytes Relative 40 %  ? Lymphs Abs 3.1 0.7 - 4.0 K/uL  ? Monocytes Relative 9 %  ? Monocytes Absolute 0.7 0.1 - 1.0 K/uL  ? Eosinophils Relative 2 %  ? Eosinophils Absolute 0.1 0.0 - 0.5 K/uL  ? Basophils Relative 1 %  ? Basophils Absolute 0.1 0.0 - 0.1 K/uL  ? Immature Granulocytes 0 %  ? Abs Immature Granulocytes 0.02 0.00 - 0.07 K/uL  ?  Comment: Performed at [facility], [address] [place], [place] [address]  ?Comprehensive metabolic panel     Status: Abnormal  ? Collection Time: 01/26/22  8:34 AM  ?Result Value Ref Range  ? Sodium 137 135 - 145 mmol/L  ? Potassium 3.7 3.5 - 5.1 mmol/L  ?  Chloride 107 98 - 111 mmol/L  ? CO2 24 22 - 32 mmol/L  ? Glucose, Bld 141 (H) 70 - 99 mg/dL  ?  Comment: Glucose reference range applies only to samples taken after fasting for at least 8 hours.  ? BUN 14 8 -

## 2022-01-26 NOTE — H&P (Signed)
?History and Physical  ? ? ?BREION NOVACEK OVZ:[id number] DOB: 09/20/1955 DOA: 01/26/2022 ? ?DOS: the patient was seen and examined on 01/26/2022 ? ?PCP: Kirk Ruths, MD  ? ?Patient coming from: Home ? ?I have personally briefly reviewed patient's old medical records in [place] ? ?Mr. Teixeira, a 66 y/o, with DM, HLD, HTN, CAD was in his usual health 01/25/22. He was active and mowed. AT 2200 while showering he noted visual loss left eye - a cludy change in vision. Due to persistent visual change he presented to [facility]ED for evaluation.  ? ?ED Course: Vitals stable. Code stroke initiated. Lab: Glucose 141. CT head w/o acute findings or hemorrhage, CTA with critical Left carotid stenosis. Exam per EDP notable for OS visual loss. Ophthalmology and neurology consulted. TRH called to admit to complete stroke workup. ? ?Review of Systems:  ?Review of Systems  ?Constitutional: Negative.   ?HENT: Negative.    ?Eyes:  Positive for blurred vision.  ?     Loss of vision upper field OS  ?Respiratory: Negative.    ?Cardiovascular: Negative.   ?Gastrointestinal: Negative.   ?Genitourinary: Negative.   ?Musculoskeletal: Negative.   ?Skin: Negative.   ?Neurological:  Negative for dizziness, tingling, sensory change, focal weakness and headaches.  ?Endo/Heme/Allergies: Negative.   ?Psychiatric/Behavioral:  Positive for memory loss.   ? ?Past Medical History:  ?Diagnosis Date  ? Alcohol abuse   ? Arthritis   ? Chronic back pain   ? Chronic kidney disease   ? Complication of anesthesia   ? had difficulty with breathing, had difficulty waking up  ? Coronary artery disease   ? Diabetes mellitus without complication ([place])   ? type 2  ? GERD (gastroesophageal reflux disease)   ? Gonorrhea   ? Headache(784.0)   ? History of kidney stones   ? Hypercholesteremia   ? no medication at this time  ? Hypertension   ? Trigger finger   ? on both hands  ? Ulcer   ? on medication  ? ? ?Past Surgical History:  ?Procedure Laterality Date  ?  ANTERIOR LAT LUMBAR FUSION  08/28/2011  ? Procedure: ANTERIOR LATERAL LUMBAR FUSION 1 LEVEL;  Surgeon: Dahlia Bailiff;  Location: [place];  Service: Orthopedics;  Laterality: N/A;  Lateral L3-4 Fusion Posterior Spinal Fusion L3-4, Possible Removal of Hardware   ? CARDIAC CATHETERIZATION N/A 02/27/2016  ? Procedure: Left Heart Cath and Coronary Angiography;  Surgeon: Corey Skains, MD;  Location: [place] [facility];  Service: Cardiovascular;  Laterality: N/A;  ? CARPAL TUNNEL RELEASE Left 05/04/2019  ? CARPAL TUNNEL RELEASE Right 02/29/2020  ? CERVICAL DISC SURGERY    ? 3 ruptured disc, 2012  ? COLONOSCOPY  2017  ? CORONARY ARTERY BYPASS GRAFT N/A 02/29/2016  ? Procedure: CORONARY ARTERY BYPASS GRAFTING (CABG), ON PUMP, TIMES THREE, USING LEFT INTERNAL MAMMARY ARTERY, RIGHT GREATER SAPHENOUS VEIN HARVESTED ENDOSCOPICALLY;  Surgeon: Gaye Pollack, MD;  Location: [place] [facility];  Service: Open Heart Surgery;  Laterality: N/A;  LIMA to LAD, SVG to RAMUS INTERMEDIATE, SVG to OM  ? ESOPHAGOGASTRODUODENOSCOPY (EGD) WITH PROPOFOL N/A 11/23/2015  ? Procedure: ESOPHAGOGASTRODUODENOSCOPY (EGD) WITH PROPOFOL;  Surgeon: Lollie Sails, MD;  Location: [facility];  Service: Endoscopy;  Laterality: N/A;  ? KNEE ARTHROSCOPY Left   ? LUMBAR FUSION  2008  ? SHOULDER ARTHROSCOPY WITH SUBACROMIAL DECOMPRESSION, ROTATOR CUFF REPAIR AND BICEP TENDON REPAIR Left 05/31/2021  ? Procedure: Left shoulder arthroscopic subscapularis repair, mini-open rotator cuff repair,  subacromial decompression, and biceps tenodesis;  Surgeon: Leim Fabry, MD;  Location: [place];  Service: Orthopedics;  Laterality: Left;  ? TEE WITHOUT CARDIOVERSION N/A 02/29/2016  ? Procedure: TRANSESOPHAGEAL ECHOCARDIOGRAM (TEE);  Surgeon: Gaye Pollack, MD;  Location: [facility] [place];  Service: Open Heart Surgery;  Laterality: N/A;  ? VASECTOMY  09/15/1981  ? ?Soc Hx - married 42 years. Three daughters, 3 grandsons, 1 grand-daughter. 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 Lives with wife. Family close by.  ? ? reports that he has quit smoking. His smoking use included cigarettes. He has never used smokeless tobacco. He reports current alcohol use. He reports that he does not use drugs. ? ?Allergies  ?Allergen Reactions  ? Atorvastatin   ?  Other reaction(s): Muscle Pain  ? Vicodin [Hydrocodone-Acetaminophen] Itching  ? Lactalbumin   ?  Other reaction(s): Other (See Comments) ?Sneezing and post nasal drip  ? Milk-Related Compounds Other (See Comments)  ?  Sneezing and post nasal drip  ? ? ?Family History  ?Problem Relation Age of Onset  ? CAD Father   ? Stomach cancer Maternal Grandmother   ? ? ?Prior to Admission medications   ?Medication Sig Start Date End Date Taking? Authorizing Provider  ?acetaminophen (TYLENOL) 500 MG tablet Take 2 tablets (1,000 mg total) by mouth every 8 (eight) hours. 05/31/21 05/31/22  Leim Fabry, MD  ?amLODipine (NORVASC) 10 MG tablet Take 10 mg by mouth daily.    [provider]  ?blood glucose meter kit and supplies Dispense based on patient and insurance preference. Use up to four times daily as directed. (FOR ICD-9 250.00, 250.01). 03/04/16   Barrett, Erin R, PA-C  ?brompheniramine-pseudoephedrine-DM 30-2-10 MG/5ML syrup Take 5 mLs by mouth 4 (four) times daily as needed. ?Patient not taking: Reported on 05/24/2021 10/16/17   Ronnell Freshwater, NP  ?cetirizine (ZYRTEC) 10 MG tablet Take 10 mg by mouth daily.    [provider]  ?diphenhydrAMINE (BENADRYL) 25 MG tablet Take 25 mg by mouth every 6 (six) hours as needed.    [provider]  ?escitalopram (LEXAPRO) 10 MG tablet Take 1 tablet (10 mg total) by mouth daily. 12/03/17   Ronnell Freshwater, NP  ?gemfibrozil (LOPID) 600 MG tablet Take 600 mg by mouth 2 (two) times daily before a meal.    [provider]  ?lisinopril (ZESTRIL) 2.5 MG tablet Take 1 tablet (2.5 mg total) by mouth daily. 03/04/16   Barrett, Erin R, PA-C  ?losartan  (COZAAR) 100 MG tablet Take 100 mg by mouth daily. ?Patient not taking: Reported on 05/24/2021    [provider]  ?metFORMIN (GLUCOPHAGE) 850 MG tablet Take 1 tablet (850 mg total) by mouth 2 (two) times daily with a meal. 03/04/16   Barrett, Erin R, PA-C  ?metoprolol (LOPRESSOR) 50 MG tablet Take 1 tablet (50 mg total) by mouth 2 (two) times daily. 03/04/16   Barrett, Erin R, PA-C  ?omeprazole (PRILOSEC) 20 MG capsule TAKE 1 CAPSULE BY MOUTH EVERY DAY ?Patient not taking: Reported on 05/24/2021 06/18/18   Ronnell Freshwater, NP  ?ondansetron (ZOFRAN ODT) 4 MG disintegrating tablet Take 1 tablet (4 mg total) by mouth every 8 (eight) hours as needed for nausea or vomiting. 05/31/21   Leim Fabry, MD  ?oxyCODONE (ROXICODONE) 5 MG immediate release tablet Take 1-2 tablets (5-10 mg total) by mouth every 4 (four) hours as needed (pain). 05/31/21 05/31/22  Leim Fabry, MD  ?pantoprazole (PROTONIX) 20 MG  tablet Take 20 mg by mouth daily.    [provider]  ?pravastatin (PRAVACHOL) 10 MG tablet Take 10 mg by mouth at bedtime.    [provider]  ? ? ?Physical Exam: ?Vitals:  ? 01/26/22 1100 01/26/22 1130 01/26/22 1200 01/26/22 1232  ?BP: 135/71 129/73 (!) 114/57 (!) 146/76  ?Pulse: (!) 56 (!) 59 (!) 58 60  ?Resp: _0 ?Temp:  98.5 ?F (36.9 ?C)  97.7 ?F (36.5 ?C)  ?TempSrc:  Oral    ?SpO2: 99% 98% 97% 99%  ?Weight:      ?Height:      ? ? ?Physical Exam ?Vitals and nursing note reviewed.  ?Constitutional:   ?   General: He is not in acute distress. ?   Appearance: Normal appearance. He is normal weight. He is not ill-appearing.  ?HENT:  ?   Head: Normocephalic and atraumatic.  ?   Mouth/Throat:  ?   Mouth: Mucous membranes are moist.  ?   Comments: No oral lesions. Native dentition ?Eyes:  ?   Extraocular Movements: Extraocular movements intact.  ?   Conjunctiva/sclera: Conjunctivae normal.  ?   Pupils: Pupils are equal, round, and reactive to light.  ?   Comments: Pupils dilated after opthal exam.  Fundiscopic exam deferred to opthal exam  ?Neck:  ?   Vascular: Carotid bruit present.  ?   Comments: Soft left carotid bruit. NO bruit right ?Cardiovascular:  ?   Rate and Rhythm: Normal rate and regular r

## 2022-01-26 NOTE — ED Provider Notes (Signed)
? ?[facility] ?Provider Note ? ? ? Event Date/Time  ? First MD Initiated Contact with Patient 01/26/22 0820   ?  (approximate) ? ? ?History  ? ?Loss of Vision ? ? ?HPI ? ?[name] is a 66 y.o. male who presents to the ED for evaluation of Loss of Vision ?  ?I reviewed PCP visit from 2/16.  History of CAD s/p CABG, HTN, HLD and DM.  Aspirin 81 is only thinner. ? ?Patient presents to the ED with his wife for evaluation of sudden painless vision loss last night around 10 PM.  He reports cutting the grass yesterday evening and thought he had some dust in his bilateral eyes, so he was taken a shower after this.  While in the shower around 10 PM, he reports sudden painless vision loss to the superior aspect of his visual fields.  This persisted throughout the night and when he woke up this morning, still had these deficits so he presents to the ED. ? ?Denies fevers, falls, syncopal episodes.  Denies balance difficulties, difficulty ambulating, using his hands.  Voice is normal and denies any other changes. ? ?Physical Exam  ? ?Triage Vital Signs: ?ED Triage Vitals  ?Enc Vitals Group  ?   BP 01/26/22 0824 136/72  ?   Pulse Rate 01/26/22 0824 64  ?   Resp 01/26/22 0824 18  ?   Temp --   ?   Temp src --   ?   SpO2 01/26/22 0824 98 %  ?   Weight 01/26/22 0823 171 lb 15.3 oz (78 kg)  ?   Height 01/26/22 0823 5\' 5"  (1.651 m)  ?   Head Circumference --   ?   Peak Flow --   ?   Pain Score 01/26/22 0823 0  ?   Pain Loc --   ?   Pain Edu? --   ?   Excl. in GC? --   ? ? ?Most recent vital signs: ?Vitals:  ? 01/26/22 1058 01/26/22 1100  ?BP: 136/76 135/71  ?Pulse: (!) 57 (!) 56  ?Resp: 14 12  ?Temp:    ?SpO2: 98% 99%  ? ? ?General: Awake, no distress. Ambulatory with a normal gait.  Pleasant and conversational in full sentences. ?CV:  Good peripheral perfusion.  ?Resp:  Normal effort.  ?Abd:  No distention.  ?MSK:  No deformity noted.  ?Neuro:  On visual field tests, I note a monocular vision loss  isolated to the superior and nasal quadrant of his left eye.  Otherwise I cannot appreciate any cranial nerve deficits.  5/5 strength and sensation to all 4 extremities. ?Other:   ? ? ?ED Results / Procedures / Treatments  ? ?Labs ?(all labs ordered are listed, but only abnormal results are displayed) ?Labs Reviewed  ?CBC - Abnormal; Notable for the following components:  ?    Result Value  ? Hemoglobin 12.4 (*)   ? HCT 38.3 (*)   ? All other components within normal limits  ?COMPREHENSIVE METABOLIC PANEL - Abnormal; Notable for the following components:  ? Glucose, Bld 141 (*)   ? All other components within normal limits  ?URINALYSIS, ROUTINE W REFLEX MICROSCOPIC - Abnormal; Notable for the following components:  ? Color, Urine YELLOW (*)   ? APPearance CLEAR (*)   ? All other components within normal limits  ?PROTIME-INR  ?APTT  ?DIFFERENTIAL  ?ETHANOL  ?URINE DRUG SCREEN, QUALITATIVE ([facility] ONLY)  ? ? ?EKG ?Sinus rhythm, rate  of 62 bpm.  Normal axis and intervals.  No evidence of acute ischemia. ? ?RADIOLOGY ?CT head reviewed by me without evidence of acute intracranial pathology ? ?Official radiology report(s): ?CT ANGIO HEAD NECK W WO CM ? ?Result Date: 01/26/2022 ?CLINICAL DATA:  Sudden monocular vision loss affecting the left eye EXAM: CT ANGIOGRAPHY HEAD AND NECK TECHNIQUE: Multidetector CT imaging of the head and neck was performed using the standard protocol during bolus administration of intravenous contrast. Multiplanar CT image reconstructions and MIPs were obtained to evaluate the vascular anatomy. Carotid stenosis measurements (when applicable) are obtained utilizing NASCET criteria, using the distal internal carotid diameter as the denominator. RADIATION DOSE REDUCTION: This exam was performed according to the departmental dose-optimization program which includes automated exposure control, adjustment of the mA and/or kV according to patient size and/or use of iterative reconstruction technique.  CONTRAST:  18mL OMNIPAQUE IOHEXOL 350 MG/ML SOLN COMPARISON:  Head CT earlier the same day FINDINGS: CTA NECK FINDINGS Aortic arch: Atheromatous plaque with 3 vessel branching. Right carotid system: Atheromatous wall thickening of the common and internal carotid arteries. Atheromatous narrowing of the proximal ICA measures 40%. Left carotid system: Atheromatous wall thickening of the common carotid with prominent plaque deposition at the bifurcation causing critical stenosis of the ICA, lumen measuring 1 mm or less with possible downstream underfilling when compared to the right. No detectable ulceration. Vertebral arteries: Proximal subclavian atherosclerosis. The vertebral arteries are smoothly contoured and diffusely patent Skeleton: Cervical spine degeneration with multilevel solid fusion. Other neck: No acute finding Upper chest: No acute finding Review of the MIP images confirms the above findings CTA HEAD FINDINGS Anterior circulation: Atheromatous calcification along the carotid siphons. Allowing for calcified plaque blooming no flow reducing stenosis is seen. Both ophthalmic arteries are enhancing proximally. Posterior circulation: The vertebral and basilar arteries are smoothly contoured and diffusely patent. No branch occlusion, beading, or aneurysm. Atheromatous irregularity of the left more than right PCA. Venous sinuses: Unremarkable. Anatomic variants: None significant Review of the MIP images confirms the above findings IMPRESSION: 1. Critical atheromatous narrowing at the left ICA origin. 2. 40% atheromatous narrowing at the proximal right ICA. 3. Intracranial atherosclerosis without proximal flow limiting stenosis. Electronically Signed   By: Tiburcio Pea M.D.   On: 01/26/2022 10:08  ? ?CT HEAD WO CONTRAST ? ?Result Date: 01/26/2022 ?CLINICAL DATA:  Neuro deficit with acute stroke suspected. Vision loss EXAM: CT HEAD WITHOUT CONTRAST TECHNIQUE: Contiguous axial images were obtained from the base  of the skull through the vertex without intravenous contrast. RADIATION DOSE REDUCTION: This exam was performed according to the departmental dose-optimization program which includes automated exposure control, adjustment of the mA and/or kV according to patient size and/or use of iterative reconstruction technique. COMPARISON:  None Available. FINDINGS: Brain: 2 areas of low-density in the left caudate nucleus which on coronal reformats appears distinct. No definite acute infarct. No hemorrhage, hydrocephalus, or collection. No masslike findings. Vascular: No hyperdense vessel or unexpected calcification. Skull: Normal. Negative for fracture or focal lesion. Sinuses/Orbits: No acute finding. No visible inflammation or mass at the orbits. 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 No acute finding. Electronically Signed   By: Tiburcio Pea M.D.   On: 01/26/2022 09:13   ? ?PROCEDURES and INTERVENTIONS: ? ?.1-3 Lead EKG Interpretation ?Performed by: Delton Prairie, MD ?Authorized by: Delton Prairie, MD  ? ?  Interpretation: normal   ?  ECG rate:  66 ?  ECG rate assessment: normal   ?  Rhythm: sinus rhythm   ?  Ectopy: none   ?  Conduction: normal   ?.Critical Care ?Performed by: Delton Prairie, MD ?Authorized by: Delton Prairie, MD  ? ?Critical care provider statement:  ?  Critical care time (minutes):  30 ?  Critical care time was exclusive of:  Separately billable procedures and treating other patients ?  Critical care was necessary to treat or prevent imminent or life-threatening deterioration of the following conditions:  CNS failure or compromise ?  Critical care was time spent personally by me on the following activities:  Development of treatment plan with patient or surrogate, discussions with consultants, evaluation of patient's response to treatment, examination of patient, ordering and review of laboratory studies, ordering and review of radiographic studies, ordering and  performing treatments and interventions, pulse oximetry, re-evaluation of patient's condition and review of old charts ? ?Medications  ?acetaminophen (TYLENOL) tablet 1,000 mg (has no administration in time ran

## 2022-01-26 NOTE — Assessment & Plan Note (Addendum)
Patient with DM2 on metformin.  ?--A1c 7.5 ?Plan: ?--cont Farxiga (new) ?

## 2022-01-26 NOTE — H&P (View-Only) (Signed)
?  ?  ? ?VASCULAR SURGERY CONSULTATION ? ? ?Requested by:  ?Dr, Adella Hare [facility]) ? ?Reason for consultation: L vision loss, L ICA high grade stenosis  ? ? ?History of Present Illness  ? ?Tyler Russell is a 66 y.o. (Feb 05, 1956) RHD male who presents with cc: loss of L eye vision.  Pt notes prior episodes of visual loss in L eye that have resolved with time.  Pt denies classic amarousis fugax.  Pt notes onset of visual loss yesterday evening initial described as clouding of vision.  This later settled into loss of superior visual fields in L eye.  Pt denies any aphasia or hemiplegia. ? 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  Pt had a CTA completed with demonstrated sub-total occlusion of L ICA. ? ?Past Medical History:  ?Diagnosis Date  ? Alcohol abuse   ? Arthritis   ? Chronic back pain   ? Chronic kidney disease   ? Complication of anesthesia   ? had difficulty with breathing, had difficulty waking up  ? Coronary artery disease   ? Diabetes mellitus without complication ([place])   ? type 2  ? GERD (gastroesophageal reflux disease)   ? Gonorrhea   ? Headache(784.0)   ? History of kidney stones   ? Hypercholesteremia   ? no medication at this time  ? Hypertension   ? Trigger finger   ? on both hands  ? Ulcer   ? on medication  ? ? ?Past Surgical History:  ?Procedure Laterality Date  ? ANTERIOR LAT LUMBAR FUSION  08/28/2011  ? Procedure: ANTERIOR LATERAL LUMBAR FUSION 1 LEVEL;  Surgeon: Dahlia Bailiff;  Location: [place];  Service: Orthopedics;  Laterality: N/A;  Lateral L3-4 Fusion Posterior Spinal Fusion L3-4, Possible Removal of Hardware   ? CARDIAC CATHETERIZATION N/A 02/27/2016  ? Procedure: Left Heart Cath and Coronary Angiography;  Surgeon: Corey Skains, MD;  Location: [place] [facility];  Service: Cardiovascular;  Laterality: N/A;  ? CARPAL TUNNEL RELEASE Left 05/04/2019  ? CARPAL TUNNEL RELEASE Right 02/29/2020  ?  CERVICAL DISC SURGERY    ? 3 ruptured disc, 2012  ? COLONOSCOPY  2017  ? CORONARY ARTERY BYPASS GRAFT N/A 02/29/2016  ? Procedure: CORONARY ARTERY BYPASS GRAFTING (CABG), ON PUMP, TIMES THREE, USING LEFT INTERNAL MAMMARY ARTERY, RIGHT GREATER SAPHENOUS VEIN HARVESTED ENDOSCOPICALLY;  Surgeon: Gaye Pollack, MD;  Location: [facility];  Service: Open Heart Surgery;  Laterality: N/A;  LIMA to LAD, SVG to RAMUS INTERMEDIATE, SVG to OM  ? ESOPHAGOGASTRODUODENOSCOPY (EGD) WITH PROPOFOL N/A 11/23/2015  ? Procedure: ESOPHAGOGASTRODUODENOSCOPY (EGD) WITH PROPOFOL;  Surgeon: Lollie Sails, MD;  Location: [facility];  Service: Endoscopy;  Laterality: N/A;  ? KNEE ARTHROSCOPY Left   ? LUMBAR FUSION  2008  ? SHOULDER ARTHROSCOPY WITH SUBACROMIAL DECOMPRESSION, ROTATOR CUFF REPAIR AND BICEP TENDON REPAIR Left 05/31/2021  ? Procedure: Left shoulder arthroscopic subscapularis repair, mini-open rotator cuff repair, subacromial decompression, and biceps tenodesis;  Surgeon: Leim Fabry, MD;  Location: [place];  Service: Orthopedics;  Laterality: Left;  ? TEE WITHOUT CARDIOVERSION N/A 02/29/2016  ? Procedure: TRANSESOPHAGEAL ECHOCARDIOGRAM (TEE);  Surgeon: Gaye Pollack, MD;  Location: [place];  Service: Open Heart Surgery;  Laterality: N/A;  ? VASECTOMY  09/15/1981  ? ?  ?Social History  ? ?Socioeconomic History  ? Marital status: Married  ?  Spouse name: Not on file  ? Number of children: Not  on file  ? Years of education: Not on file  ? Highest education level: Not on file  ?Occupational History  ? Not on file  ?Tobacco Use  ? Smoking status: Former  ?  Types: Cigarettes  ? Smokeless tobacco: Never  ? Tobacco comments:  ?  stopped at age 63 years  ?Vaping Use  ? Vaping Use: Never used  ?Substance and Sexual Activity  ? Alcohol use: Yes  ?  Comment: occassional  ? Drug use: No  ? Sexual activity: Yes  ?Other Topics Concern  ? Not on file  ?Social History Narrative  ? Independent at baseline, lives with family   ? ?Social Determinants of Health  ? ?Financial Resource Strain: Not on file  ?Food Insecurity: Not on file  ?Transportation Needs: Not on file  ?Physical Activity: Not on file  ?Stress: Not on file  ?Social Connections: Not on file  ?Intimate Partner Violence: Not on file  ? ? ?Family History  ?Problem Relation Age of Onset  ? CAD Father   ? Stomach cancer Maternal Grandmother   ? ? ?Current Facility-Administered Medications  ?Medication Dose Route Frequency Provider Last Rate Last Admin  ? 0.9 %  sodium chloride infusion   Intravenous Continuous Norins, Heinz Knuckles, MD 75 mL/hr at 01/26/22 1524 New Bag at 01/26/22 1524  ? acetaminophen (TYLENOL) tablet 1,000 mg  1,000 mg Oral Q8H PRN Neena Rhymes, MD   1,000 mg at 01/26/22 1530  ? amLODipine (NORVASC) tablet 10 mg  10 mg Oral Daily Norins, Heinz Knuckles, MD   10 mg at 01/26/22 1502  ? aspirin EC tablet 81 mg  81 mg Oral Daily Neena Rhymes, MD   81 mg at 01/26/22 1501  ? clopidogrel (PLAVIX) tablet 75 mg  75 mg Oral Daily Norins, Heinz Knuckles, MD   75 mg at 01/26/22 1501  ? dapagliflozin propanediol (FARXIGA) tablet 10 mg  10 mg Oral Daily Norins, Heinz Knuckles, MD   10 mg at 01/26/22 1502  ? enoxaparin (LOVENOX) injection 40 mg  40 mg Subcutaneous Q24H Norins, Heinz Knuckles, MD      ? escitalopram (LEXAPRO) tablet 10 mg  10 mg Oral Daily Norins, Heinz Knuckles, MD   10 mg at 01/26/22 1502  ? gemfibrozil (LOPID) tablet 600 mg  600 mg Oral BID AC Norins, Heinz Knuckles, MD   600 mg at 01/26/22 1710  ? lisinopril (ZESTRIL) tablet 2.5 mg  2.5 mg Oral Daily Norins, Heinz Knuckles, MD   2.5 mg at 01/26/22 1501  ? loratadine (CLARITIN) tablet 10 mg  10 mg Oral Daily PRN Norins, Heinz Knuckles, MD      ? metoprolol tartrate (LOPRESSOR) tablet 50 mg  50 mg Oral BID Norins, Heinz Knuckles, MD      ? pantoprazole (PROTONIX) EC tablet 20 mg  20 mg Oral Daily Norins, Heinz Knuckles, MD   20 mg at 01/26/22 1502  ? rosuvastatin (CRESTOR) tablet 40 mg  40 mg Oral Daily Norins, Heinz Knuckles, MD   40 mg at 01/26/22 1501   ? senna-docusate (Senokot-S) tablet 1 tablet  1 tablet Oral QHS Norins, Heinz Knuckles, MD      ? ? ?Allergies  ?Allergen Reactions  ? Atorvastatin   ?  Other reaction(s): Muscle Pain  ? Vicodin [Hydrocodone-Acetaminophen] Itching  ? Lactalbumin   ?  Other reaction(s): Other (See Comments) ?Sneezing and post nasal drip  ? Milk-Related Compounds Other (See Comments)  ?  Sneezing and post nasal drip  ? ? ?  REVIEW OF SYSTEMS (negative unless checked):  ? ?Cardiac:  ?[]  Chest pain or chest pressure? ?[]  Shortness of breath upon activity? ?[]  Shortness of breath when lying flat? ?[]  Irregular heart rhythm? ? ?Vascular:  ?[]  Pain in calf, thigh, or hip brought on by walking? ?[]  Pain in feet at night that wakes you up from your sleep? ?[]  Blood clot in your veins? ?[]  Leg swelling? ? ?Pulmonary:  ?[]  Oxygen at home? ?[]  Productive cough? ?[]  Wheezing? ? ?Neurologic:  ?[]  Sudden weakness in arms or legs? ?[]  Sudden numbness in arms or legs? ?[]  Sudden onset of difficult speaking or slurred speech? ?[x]  Loss of vision in one eye? ?[]  Problems with dizziness? ? ?Gastrointestinal:  ?[]  Blood in stool? ?[]  Vomited blood? ? ?Genitourinary:  ?[]  Burning when urinating? ?[]  Blood in urine? ? ?Psychiatric:  ?[]  Major depression ? ?Hematologic:  ?[]  Bleeding problems? ?[]  Problems with blood clotting? ? ?Dermatologic:  ?[]  Rashes or ulcers? ? ?Constitutional:  ?[]  Fever or chills? ? ?Ear/Nose/Throat:  ?[]  Change in hearing? ?[]  Nose bleeds? ?[]  Sore throat? ? ?Musculoskeletal:  ?[]  Back pain? ?[]  Joint pain? ?[]  Muscle pain? ? ? ?Physical Examination  ?  ? ?Vitals:  ? 01/26/22 1130 01/26/22 1200 01/26/22 1232 01/26/22 1648  ?BP: 129/73 (!) 114/57 (!) 146/76 134/76  ?Pulse: (!) 59 (!) 58 60 63  ?Resp: 13 17 18 19   ?Temp: 98.5 ?F (36.9 ?C)  97.7 ?F (36.5 ?C) 97.7 ?F (36.5 ?C)  ?TempSrc: Oral     ?SpO2: 98% 97% 99% 98%  ?Weight:      ?Height:      ? ?Body mass index is 28.62 kg/m?. ? ?General Alert, O x 3, WD, NAD  ?Head [place]/AT,     ?Ear/Nose/ ?Throat Hearing grossly intact, nares without erythema or drainage, oropharynx without Erythema or Exudate, Mallampati score: 3,   ?Eyes Both pupils dilated , EOMI  ?Neck Supple, mid-line trachea,    ?Pulmonary

## 2022-01-26 NOTE — ED Notes (Signed)
RN to bedside to introduce self to pt and initiate pt care.  ?

## 2022-01-26 NOTE — Assessment & Plan Note (Addendum)
Changed from Pravachol to rosuvastatin 40 mg ?--cont rosuvastatin 40 mg ?--cont gemfibrozil ? ?

## 2022-01-26 NOTE — Assessment & Plan Note (Addendum)
BP adquately controlled. ?- Normotension blood pressure goal ?Plan: ?--cont amlodipine, Lisinopril and Lopressor ? ?

## 2022-01-27 ENCOUNTER — Other Ambulatory Visit (HOSPITAL_COMMUNITY): Payer: Self-pay

## 2022-01-27 ENCOUNTER — Inpatient Hospital Stay
Admit: 2022-01-27 | Discharge: 2022-01-27 | Disposition: A | Discharge status: Home / Self Care | Payer: Medicare PPO | Attending: Internal Medicine | Admitting: Internal Medicine

## 2022-01-27 DIAGNOSIS — H34232 Retinal artery branch occlusion, left eye: Secondary | ICD-10-CM | POA: Diagnosis not present

## 2022-01-27 LAB — LIPID PANEL
Cholesterol: 198 mg/dL (ref 0–200)
HDL: 43 mg/dL (ref >40)
LDL Cholesterol: 113 mg/dL — ABNORMAL HIGH (ref 0–99)
Total CHOL/HDL Ratio: 4.6 RATIO
Triglycerides: 208 mg/dL — ABNORMAL HIGH (ref <150)
VLDL: 42 mg/dL — ABNORMAL HIGH (ref 0–40)

## 2022-01-27 LAB — ECHOCARDIOGRAM COMPLETE
AR max vel: 1.7 cm2
AV Area VTI: 1.43 cm2
AV Area mean vel: 1.4 cm2
AV Mean grad: 8 mmHg
AV Peak grad: 12.3 mmHg
Ao pk vel: 1.75 m/s
Area-P 1/2: 3.61 cm2
Height: 65 in
MV VTI: 1.47 cm2
S' Lateral: 2.49 cm
Weight: 2751.34 oz

## 2022-01-27 LAB — VITAMIN B12: Vitamin B-12: 167 pg/mL — ABNORMAL LOW (ref 180–914)

## 2022-01-27 LAB — HIV ANTIBODY (ROUTINE TESTING W REFLEX): HIV Screen 4th Generation wRfx: NONREACTIVE

## 2022-01-27 LAB — FOLATE: Folate: 27 ng/mL (ref >5.9)

## 2022-01-27 NOTE — Progress Notes (Signed)
?  Progress Note ? ? ?Patient: Tyler Russell [id number] DOB: Oct 11, 1955 DOA: 01/26/2022     1 ?DOS: the patient was seen and examined on 01/27/2022 ?  ?Brief hospital course: ?No notes on file ? ?Assessment and Plan: ?* Retinal artery occlusion, branch, left ?Left vision loss from branch retinal artery occlusion from atheroembolic secondary to critical left carotid stenosis ?--dx by ophthalmology ?--neuro consulted ?Plan: ?--plan for L CEA with Vascular tomorrow ?--cont ASA, hold plavix ?--cont statin ?--Echo ? ?DM2 (diabetes mellitus, type 2) ([place]) ?Patient with DM2 on metformin.  ?--A1c 7.5 ?Plan: ?--cont Farxiga (new) ? ?Essential hypertension ?BP adquately controlled. ?- Normotension blood pressure goal ?Plan: ?--cont amlodipine, Lisinopril and Lopressor ? ? ?Generalized anxiety disorder ?--cont Lexapro ? ?Mixed hyperlipidemia ?Changed from Pravachol to rosuvastatin 40 mg ?--cont rosuvastatin 40 mg ?--cont gemfibrozil ? ? ? ? ? ?  ? ?Subjective:  ?Pt continued to have left vision loss.   ?Vascular surgery planned for L CEA tomorrow. ? ? ?Physical Exam: ? ?Constitutional: NAD, AAOx3 ?HEENT: conjunctivae and lids normal, EOMI ?CV: No cyanosis.   ?RESP: normal respiratory effort, on RA ?Extremities: No effusions, edema in BLE ?SKIN: warm, dry ?Psych: Normal mood and affect.  Appropriate judgement and reason ? ? ?Data Reviewed: ? ?Family Communication:  ? ?Disposition: ?Status is: Inpatient ? ? Planned Discharge Destination: Home ? ? ? ?Time spent: 50 minutes ? ?Author: ?Enzo Bi, MD ?01/27/2022 4:42 PM ? ?For on call review [email or web address].  ?

## 2022-01-27 NOTE — Progress Notes (Signed)
Patient with critical left carotid stenosis and visual symptoms with signs of embolization. Needs left CEA. Discussed with patient and family and plan to proceed with left CEA tomorrow. Risks and benefits discussed.

## 2022-01-27 NOTE — Plan of Care (Signed)

## 2022-01-27 NOTE — TOC Benefit Eligibility Note (Signed)
Patient Advocate Encounter ?  ?Insurance verification completed.   ?  ?The patient is currently admitted and upon discharge could be taking JARDIANCE 10 MG. ?  ?The current 30 day co-pay is, $40.  ? ?The patient is currently admitted and upon discharge could be taking FARXIGA 10 MG. ?  ?The current 30 day co-pay is, $64.  ? ?The patient is insured through [employer]. ? ? ?   ?

## 2022-01-27 NOTE — Progress Notes (Signed)
OT Cancellation Note ? ?Patient Details ?Name: SUHAYB SLEMMER ?MRN: [id number] ?DOB: Aug 11, 1956 ? ? ?Cancelled Treatment:    Reason Eval/Treat Not Completed: OT screened, no needs identified, will sign off. Consult received, chart reviewed. Upon assessment, pt noted to be independent with mobility, ADL, and denies difficulty with IADL such as cell phone use while in the hospital. Family in agreement. 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 Provided brief review of compensatory strategies. No charge, screen completed. Will sign off. Pt/family in agreement.  ? ?Ardeth Perfect., MPH, MS, OTR/L ?ascom [phone] ?01/27/22, 11:43 AM ? ?

## 2022-01-28 ENCOUNTER — Other Ambulatory Visit: Payer: Self-pay

## 2022-01-28 ENCOUNTER — Inpatient Hospital Stay: Payer: Medicare PPO

## 2022-01-28 ENCOUNTER — Encounter
Admission: EM | Disposition: A | Discharge status: Home / Self Care | Payer: Self-pay | Source: Home / Self Care | Attending: Hospitalist

## 2022-01-28 ENCOUNTER — Encounter: Payer: Self-pay | Admitting: Internal Medicine

## 2022-01-28 DIAGNOSIS — I6522 Occlusion and stenosis of left carotid artery: Principal | ICD-10-CM

## 2022-01-28 DIAGNOSIS — H34232 Retinal artery branch occlusion, left eye: Secondary | ICD-10-CM | POA: Diagnosis not present

## 2022-01-28 HISTORY — PX: ENDARTERECTOMY: SHX5162

## 2022-01-28 LAB — CBC
HCT: 39.3 % (ref 39.0–52.0)
Hemoglobin: 12.7 g/dL — ABNORMAL LOW (ref 13.0–17.0)
MCH: 27.2 pg (ref 26.0–34.0)
MCHC: 32.3 g/dL (ref 30.0–36.0)
MCV: 84.2 fL (ref 80.0–100.0)
Platelets: 303 10*3/uL (ref 150–400)
RBC: 4.67 MIL/uL (ref 4.22–5.81)
RDW: 13.6 % (ref 11.5–15.5)
WBC: 8.2 10*3/uL (ref 4.0–10.5)
nRBC: 0 % (ref 0.0–0.2)

## 2022-01-28 LAB — GLUCOSE, CAPILLARY
Glucose-Capillary: 94 mg/dL (ref 70–99)
Glucose-Capillary: 115 mg/dL — ABNORMAL HIGH (ref 70–99)

## 2022-01-28 LAB — BASIC METABOLIC PANEL
Anion gap: 8 (ref 5–15)
BUN: 17 mg/dL (ref 8–23)
CO2: 24 mmol/L (ref 22–32)
Calcium: 9.1 mg/dL (ref 8.9–10.3)
Chloride: 105 mmol/L (ref 98–111)
Creatinine, Ser: 1.09 mg/dL (ref 0.61–1.24)
GFR, Estimated: >60 mL/min (ref >60)
Glucose, Bld: 142 mg/dL — ABNORMAL HIGH (ref 70–99)
Potassium: 4.2 mmol/L (ref 3.5–5.1)
Sodium: 137 mmol/L (ref 135–145)

## 2022-01-28 LAB — MAGNESIUM: Magnesium: 2.6 mg/dL — ABNORMAL HIGH (ref 1.7–2.4)

## 2022-01-28 SURGERY — ENDARTERECTOMY, CAROTID
Anesthesia: General | Laterality: Left

## 2022-01-28 MED ORDER — SODIUM CHLORIDE 0.9 % IV SOLN: INTRAVENOUS | Status: DC

## 2022-01-28 MED ORDER — SODIUM CHLORIDE 0.9 % IV SOLN: 500.0000 mL | Freq: Once | INTRAVENOUS | Status: DC | PRN

## 2022-01-28 MED ORDER — ASPIRIN 81 MG PO TBEC: 81.0000 mg | DELAYED_RELEASE_TABLET | Freq: Every day | ORAL | Status: DC

## 2022-01-28 MED ORDER — ONDANSETRON HCL 4 MG/2ML IJ SOLN: 4.0000 mg | Freq: Once | INTRAMUSCULAR | Status: DC | PRN

## 2022-01-28 MED ORDER — ROCURONIUM BROMIDE 100 MG/10ML IV SOLN
INTRAVENOUS | Status: DC | PRN
  Administered 2022-01-28: 20 mg via INTRAVENOUS
  Administered 2022-01-28: 50 mg via INTRAVENOUS
  Administered 2022-01-28: 20 mg via INTRAVENOUS

## 2022-01-28 MED ORDER — ALUM & MAG HYDROXIDE-SIMETH 200-200-20 MG/5ML PO SUSP: 15.0000 mL | ORAL | Status: DC | PRN

## 2022-01-28 MED ORDER — HEMOSTATIC AGENTS (NO CHARGE) OPTIME
TOPICAL | Status: DC | PRN
  Administered 2022-01-28: 1 via TOPICAL

## 2022-01-28 MED ORDER — HEPARIN SODIUM (PORCINE) 1000 UNIT/ML IJ SOLN
INTRAMUSCULAR | Status: AC
  Filled 2022-01-28: qty 10

## 2022-01-28 MED ORDER — KETAMINE HCL 50 MG/5ML IJ SOSY
PREFILLED_SYRINGE | INTRAMUSCULAR | Status: AC
  Filled 2022-01-28: qty 5

## 2022-01-28 MED ORDER — CEFAZOLIN SODIUM-DEXTROSE 2-4 GM/100ML-% IV SOLN
2.0000 g | Freq: Three times a day (TID) | INTRAVENOUS | Status: AC
  Administered 2022-01-28 – 2022-01-29 (×2): 2 g via INTRAVENOUS
  Filled 2022-01-28 (×2): qty 100

## 2022-01-28 MED ORDER — LABETALOL HCL 5 MG/ML IV SOLN: 10.0000 mg | INTRAVENOUS | Status: DC | PRN

## 2022-01-28 MED ORDER — PHENOL 1.4 % MT LIQD: 1.0000 | OROMUCOSAL | Status: DC | PRN

## 2022-01-28 MED ORDER — 0.9 % SODIUM CHLORIDE (POUR BTL) OPTIME
TOPICAL | Status: DC | PRN
Start: 2022-01-28 — End: 2022-01-28
  Administered 2022-01-28: 150 mL

## 2022-01-28 MED ORDER — EPHEDRINE 5 MG/ML INJ
INTRAVENOUS | Status: AC
  Filled 2022-01-28: qty 5

## 2022-01-28 MED ORDER — LIDOCAINE HCL 1 % IJ SOLN
INTRAMUSCULAR | Status: DC | PRN
Start: 2022-01-28 — End: 2022-01-28
  Administered 2022-01-28: 10 mL

## 2022-01-28 MED ORDER — MIDAZOLAM HCL 2 MG/2ML IJ SOLN
INTRAMUSCULAR | Status: AC
  Filled 2022-01-28: qty 2

## 2022-01-28 MED ORDER — PHENYLEPHRINE 80 MCG/ML (10ML) SYRINGE FOR IV PUSH (FOR BLOOD PRESSURE SUPPORT)
PREFILLED_SYRINGE | INTRAVENOUS | Status: AC
  Filled 2022-01-28: qty 10

## 2022-01-28 MED ORDER — GUAIFENESIN-DM 100-10 MG/5ML PO SYRP: 15.0000 mL | ORAL_SOLUTION | ORAL | Status: DC | PRN

## 2022-01-28 MED ORDER — CHLORHEXIDINE GLUCONATE CLOTH 2 % EX PADS
6.0000 | MEDICATED_PAD | Freq: Once | CUTANEOUS | Status: AC
  Administered 2022-01-28: 6 via TOPICAL

## 2022-01-28 MED ORDER — PHENYLEPHRINE HCL (PRESSORS) 10 MG/ML IV SOLN
INTRAVENOUS | Status: DC | PRN
  Administered 2022-01-28 (×2): 160 ug via INTRAVENOUS

## 2022-01-28 MED ORDER — PHENYLEPHRINE HCL-NACL 20-0.9 MG/250ML-% IV SOLN
INTRAVENOUS | Status: DC | PRN
  Administered 2022-01-28: 30 ug/min via INTRAVENOUS

## 2022-01-28 MED ORDER — LIDOCAINE HCL (PF) 1 % IJ SOLN
INTRAMUSCULAR | Status: AC
  Filled 2022-01-28: qty 30

## 2022-01-28 MED ORDER — CEFAZOLIN SODIUM-DEXTROSE 2-3 GM-%(50ML) IV SOLR
INTRAVENOUS | Status: DC | PRN
  Administered 2022-01-28: 2 g via INTRAVENOUS

## 2022-01-28 MED ORDER — CEFAZOLIN SODIUM-DEXTROSE 2-4 GM/100ML-% IV SOLN: 2.0000 g | INTRAVENOUS | Status: DC

## 2022-01-28 MED ORDER — DIPHENHYDRAMINE HCL 25 MG PO CAPS
25.0000 mg | ORAL_CAPSULE | Freq: Four times a day (QID) | ORAL | Status: DC | PRN
  Administered 2022-01-29: 25 mg via ORAL
  Filled 2022-01-28 (×3): qty 1

## 2022-01-28 MED ORDER — ORAL CARE MOUTH RINSE
15.0000 mL | Freq: Two times a day (BID) | OROMUCOSAL | Status: DC
  Administered 2022-01-29: 15 mL via OROMUCOSAL

## 2022-01-28 MED ORDER — FAMOTIDINE IN NACL 20-0.9 MG/50ML-% IV SOLN
20.0000 mg | Freq: Two times a day (BID) | INTRAVENOUS | Status: DC
  Administered 2022-01-28 – 2022-01-29 (×2): 20 mg via INTRAVENOUS
  Filled 2022-01-28 (×2): qty 50

## 2022-01-28 MED ORDER — ESMOLOL HCL-SODIUM CHLORIDE 2000 MG/100ML IV SOLN
25.0000 ug/kg/min | INTRAVENOUS | Status: DC
  Filled 2022-01-28: qty 100

## 2022-01-28 MED ORDER — PROPOFOL 10 MG/ML IV BOLUS
INTRAVENOUS | Status: DC | PRN
  Administered 2022-01-28: 150 mg via INTRAVENOUS

## 2022-01-28 MED ORDER — HYDRALAZINE HCL 20 MG/ML IJ SOLN: 5.0000 mg | INTRAMUSCULAR | Status: DC | PRN

## 2022-01-28 MED ORDER — FENTANYL CITRATE (PF) 100 MCG/2ML IJ SOLN
INTRAMUSCULAR | Status: AC
  Filled 2022-01-28: qty 2

## 2022-01-28 MED ORDER — VASOPRESSIN 20 UNIT/ML IV SOLN
INTRAVENOUS | Status: AC
  Filled 2022-01-28: qty 1

## 2022-01-28 MED ORDER — ONDANSETRON HCL 4 MG/2ML IJ SOLN: 4.0000 mg | Freq: Four times a day (QID) | INTRAMUSCULAR | Status: DC | PRN

## 2022-01-28 MED ORDER — SODIUM CHLORIDE 0.9 % IV SOLN
INTRAVENOUS | Status: DC | PRN
  Administered 2022-01-28: 100 mL via INTRAMUSCULAR

## 2022-01-28 MED ORDER — DOCUSATE SODIUM 100 MG PO CAPS
100.0000 mg | ORAL_CAPSULE | Freq: Every day | ORAL | Status: DC
  Administered 2022-01-29: 100 mg via ORAL
  Filled 2022-01-28: qty 1

## 2022-01-28 MED ORDER — ROCURONIUM BROMIDE 10 MG/ML (PF) SYRINGE
PREFILLED_SYRINGE | INTRAVENOUS | Status: AC
  Filled 2022-01-28: qty 10

## 2022-01-28 MED ORDER — LIDOCAINE HCL (PF) 2 % IJ SOLN
INTRAMUSCULAR | Status: AC
  Filled 2022-01-28: qty 5

## 2022-01-28 MED ORDER — CEFAZOLIN SODIUM 1 G IJ SOLR
INTRAMUSCULAR | Status: AC
  Filled 2022-01-28: qty 20

## 2022-01-28 MED ORDER — ACETAMINOPHEN 650 MG RE SUPP: 325.0000 mg | RECTAL | Status: DC | PRN

## 2022-01-28 MED ORDER — "VISTASEAL 4 ML SINGLE DOSE KIT "
PACK | CUTANEOUS | Status: DC | PRN
  Administered 2022-01-28: 4 mL via TOPICAL

## 2022-01-28 MED ORDER — METOPROLOL TARTRATE 5 MG/5ML IV SOLN: 2.0000 mg | INTRAVENOUS | Status: DC | PRN

## 2022-01-28 MED ORDER — LIDOCAINE HCL (CARDIAC) PF 100 MG/5ML IV SOSY
PREFILLED_SYRINGE | INTRAVENOUS | Status: DC | PRN
  Administered 2022-01-28: 80 mg via INTRAVENOUS

## 2022-01-28 MED ORDER — DEXAMETHASONE SODIUM PHOSPHATE 10 MG/ML IJ SOLN
INTRAMUSCULAR | Status: DC | PRN
  Administered 2022-01-28: 5 mg via INTRAVENOUS

## 2022-01-28 MED ORDER — ASPIRIN EC 81 MG PO TBEC: 81.0000 mg | DELAYED_RELEASE_TABLET | Freq: Every day | ORAL | Status: DC

## 2022-01-28 MED ORDER — LIDOCAINE HCL 4 % MT SOLN
OROMUCOSAL | Status: DC | PRN
  Administered 2022-01-28: 4 mL via TOPICAL

## 2022-01-28 MED ORDER — MAGNESIUM SULFATE 2 GM/50ML IV SOLN: 2.0000 g | Freq: Every day | INTRAVENOUS | Status: DC | PRN

## 2022-01-28 MED ORDER — ROSUVASTATIN CALCIUM 10 MG PO TABS: 5.0000 mg | ORAL_TABLET | Freq: Every day | ORAL | Status: DC

## 2022-01-28 MED ORDER — NITROGLYCERIN IN D5W 200-5 MCG/ML-% IV SOLN: 5.0000 ug/min | INTRAVENOUS | Status: DC

## 2022-01-28 MED ORDER — OXYCODONE-ACETAMINOPHEN 5-325 MG PO TABS
1.0000 | ORAL_TABLET | ORAL | Status: DC | PRN
  Administered 2022-01-28: 2 via ORAL
  Administered 2022-01-29: 1 via ORAL
  Filled 2022-01-28: qty 2
  Filled 2022-01-28: qty 1

## 2022-01-28 MED ORDER — EPHEDRINE SULFATE (PRESSORS) 50 MG/ML IJ SOLN
INTRAMUSCULAR | Status: DC | PRN
  Administered 2022-01-28: 5 mg via INTRAVENOUS
  Administered 2022-01-28: 15 mg via INTRAVENOUS

## 2022-01-28 MED ORDER — CHLORHEXIDINE GLUCONATE CLOTH 2 % EX PADS: 6.0000 | MEDICATED_PAD | Freq: Once | CUTANEOUS | Status: DC

## 2022-01-28 MED ORDER — FENTANYL CITRATE (PF) 100 MCG/2ML IJ SOLN: 25.0000 ug | INTRAMUSCULAR | Status: DC | PRN

## 2022-01-28 MED ORDER — LACTATED RINGERS IV SOLN: INTRAVENOUS | Status: DC | PRN

## 2022-01-28 MED ORDER — FENTANYL CITRATE (PF) 100 MCG/2ML IJ SOLN
INTRAMUSCULAR | Status: DC | PRN
  Administered 2022-01-28: 100 ug via INTRAVENOUS

## 2022-01-28 MED ORDER — ACETAMINOPHEN 325 MG PO TABS: 325.0000 mg | ORAL_TABLET | ORAL | Status: DC | PRN

## 2022-01-28 MED ORDER — PROPOFOL 10 MG/ML IV BOLUS
INTRAVENOUS | Status: AC
  Filled 2022-01-28: qty 20

## 2022-01-28 MED ORDER — ONDANSETRON HCL 4 MG/2ML IJ SOLN
INTRAMUSCULAR | Status: DC | PRN
  Administered 2022-01-28: 4 mg via INTRAVENOUS

## 2022-01-28 MED ORDER — KETAMINE HCL 10 MG/ML IJ SOLN
INTRAMUSCULAR | Status: DC | PRN
  Administered 2022-01-28: 30 mg via INTRAVENOUS

## 2022-01-28 MED ORDER — CHLORHEXIDINE GLUCONATE CLOTH 2 % EX PADS
6.0000 | MEDICATED_PAD | Freq: Every day | CUTANEOUS | Status: DC
  Administered 2022-01-28: 6 via TOPICAL

## 2022-01-28 MED ORDER — HEPARIN SODIUM (PORCINE) 1000 UNIT/ML IJ SOLN
INTRAMUSCULAR | Status: DC | PRN
  Administered 2022-01-28: 6000 [IU] via INTRAVENOUS

## 2022-01-28 MED ORDER — CLOPIDOGREL BISULFATE 75 MG PO TABS
75.0000 mg | ORAL_TABLET | Freq: Every day | ORAL | Status: DC
  Administered 2022-01-29: 75 mg via ORAL
  Filled 2022-01-28: qty 1

## 2022-01-28 MED ORDER — NITROGLYCERIN IN D5W 200-5 MCG/ML-% IV SOLN
INTRAVENOUS | Status: AC
  Filled 2022-01-28: qty 250

## 2022-01-28 MED ORDER — MIDAZOLAM HCL 2 MG/2ML IJ SOLN
INTRAMUSCULAR | Status: DC | PRN
  Administered 2022-01-28: 2 mg via INTRAVENOUS

## 2022-01-28 MED ORDER — PROPOFOL 10 MG/ML IV BOLUS
INTRAVENOUS | Status: AC
Start: 2022-01-28 — End: ?
  Filled 2022-01-28: qty 20

## 2022-01-28 MED ORDER — HEPARIN SODIUM (PORCINE) 10000 UNIT/ML IJ SOLN
INTRAMUSCULAR | Status: AC
  Filled 2022-01-28: qty 1

## 2022-01-28 MED ORDER — POTASSIUM CHLORIDE CRYS ER 20 MEQ PO TBCR: 20.0000 meq | EXTENDED_RELEASE_TABLET | Freq: Every day | ORAL | Status: DC | PRN

## 2022-01-28 MED ORDER — MORPHINE SULFATE (PF) 2 MG/ML IV SOLN: 2.0000 mg | INTRAVENOUS | Status: DC | PRN

## 2022-01-28 MED ORDER — SUGAMMADEX SODIUM 200 MG/2ML IV SOLN
INTRAVENOUS | Status: DC | PRN
  Administered 2022-01-28: 400 mg via INTRAVENOUS

## 2022-01-28 MED ORDER — ONDANSETRON HCL 4 MG/2ML IJ SOLN
INTRAMUSCULAR | Status: AC
  Filled 2022-01-28: qty 2

## 2022-01-28 MED ORDER — DEXAMETHASONE SODIUM PHOSPHATE 10 MG/ML IJ SOLN
INTRAMUSCULAR | Status: AC
  Filled 2022-01-28: qty 1

## 2022-01-28 SURGICAL SUPPLY — 60 items
"PENCIL ELECTRO HAND CTR " (MISCELLANEOUS) IMPLANT
ADH SKN CLS APL DERMABOND .7 (GAUZE/BANDAGES/DRESSINGS) ×1
BAG DECANTER FOR FLEXI CONT (MISCELLANEOUS) ×3 IMPLANT
BLADE SURG 15 STRL LF DISP TIS (BLADE) ×2 IMPLANT
BLADE SURG 15 STRL SS (BLADE) ×2
BLADE SURG SZ11 CARB STEEL (BLADE) ×3 IMPLANT
BOOT SUTURE AID YELLOW STND (SUTURE) ×3 IMPLANT
BRUSH SCRUB EZ  4% CHG (MISCELLANEOUS) ×2
BRUSH SCRUB EZ 4% CHG (MISCELLANEOUS) ×2 IMPLANT
DERMABOND ADVANCED (GAUZE/BANDAGES/DRESSINGS) ×1
DERMABOND ADVANCED .7 DNX12 (GAUZE/BANDAGES/DRESSINGS) ×2 IMPLANT
DRAPE INCISE IOBAN 66X45 STRL (DRAPES) ×3 IMPLANT
ELECT CAUTERY BLADE 6.4 (BLADE) ×3 IMPLANT
ELECT REM PT RETURN 9FT ADLT (ELECTROSURGICAL) ×2
ELECTRODE REM PT RTRN 9FT ADLT (ELECTROSURGICAL) ×2 IMPLANT
GLOVE BIO SURGEON STRL SZ7 (GLOVE) ×6 IMPLANT
GOWN STRL REUS W/ TWL LRG LVL3 (GOWN DISPOSABLE) ×4 IMPLANT
GOWN STRL REUS W/ TWL XL LVL3 (GOWN DISPOSABLE) ×4 IMPLANT
GOWN STRL REUS W/TWL LRG LVL3 (GOWN DISPOSABLE) ×4
GOWN STRL REUS W/TWL XL LVL3 (GOWN DISPOSABLE) ×2
HEMOSTAT SURGICEL 2X3 (HEMOSTASIS) ×3 IMPLANT
IV NS 250ML (IV SOLUTION) ×2
IV NS 250ML BAXH (IV SOLUTION) ×2 IMPLANT
KIT TURNOVER KIT A (KITS) ×3 IMPLANT
LABEL OR SOLS (LABEL) ×3 IMPLANT
LOOP RED MAXI  1X406MM (MISCELLANEOUS) ×2
LOOP VESSEL MAXI  1X406 RED (MISCELLANEOUS) ×2
LOOP VESSEL MAXI 1X406 RED (MISCELLANEOUS) ×4 IMPLANT
LOOP VESSEL MINI 0.8X406 BLUE (MISCELLANEOUS) ×2 IMPLANT
LOOPS BLUE MINI 0.8X406MM (MISCELLANEOUS) ×1
MANIFOLD NEPTUNE II (INSTRUMENTS) ×3 IMPLANT
NDL FILTER BLUNT 18X1 1/2 (NEEDLE) ×2 IMPLANT
NDL HYPO 25X1 1.5 SAFETY (NEEDLE) ×2 IMPLANT
NEEDLE FILTER BLUNT 18X 1/2SAF (NEEDLE) ×1
NEEDLE FILTER BLUNT 18X1 1/2 (NEEDLE) ×1 IMPLANT
NEEDLE HYPO 25X1 1.5 SAFETY (NEEDLE) ×2 IMPLANT
NS IRRIG 500ML POUR BTL (IV SOLUTION) ×3 IMPLANT
PACK BASIN MAJOR ARMC (MISCELLANEOUS) ×3 IMPLANT
PATCH CAROTID ECM VASC 1X10 (Prosthesis & Implant Heart) ×3 IMPLANT
PENCIL ELECTRO HAND CTR (MISCELLANEOUS) ×1 IMPLANT
SET WALTER ACTIVATION W/DRAPE (SET/KITS/TRAYS/PACK) ×3 IMPLANT
SHUNT W TPORT 9FR PRUITT F3 (SHUNT) ×3 IMPLANT
SPONGE T-LAP 18X18 ~~LOC~~+RFID (SPONGE) ×6 IMPLANT
SUT MNCRL 4-0 (SUTURE) ×2
SUT MNCRL 4-0 27XMFL (SUTURE) ×1
SUT PROLENE 6 0 BV (SUTURE) ×15 IMPLANT
SUT PROLENE 7 0 BV 1 (SUTURE) ×6 IMPLANT
SUT SILK 2 0 (SUTURE) ×2
SUT SILK 2-0 18XBRD TIE 12 (SUTURE) ×2 IMPLANT
SUT SILK 3 0 (SUTURE) ×2
SUT SILK 3-0 18XBRD TIE 12 (SUTURE) ×2 IMPLANT
SUT SILK 4 0 (SUTURE) ×2
SUT SILK 4-0 18XBRD TIE 12 (SUTURE) ×2 IMPLANT
SUT VIC AB 3-0 SH 27 (SUTURE) ×4
SUT VIC AB 3-0 SH 27X BRD (SUTURE) ×4 IMPLANT
SUTURE MNCRL 4-0 27XMF (SUTURE) ×2 IMPLANT
SYR 10ML LL (SYRINGE) ×6 IMPLANT
SYR 20ML LL LF (SYRINGE) ×3 IMPLANT
TRAY FOLEY MTR SLVR 16FR STAT (SET/KITS/TRAYS/PACK) ×3 IMPLANT
WATER STERILE IRR 500ML POUR (IV SOLUTION) ×3 IMPLANT

## 2022-01-28 NOTE — Op Note (Signed)
[facility] ? ? ?OPERATIVE NOTE ? ?PROCEDURE:   ?1.  Left carotid endarterectomy with CorMatrix arterial patch reconstruction ? ?PRE-OPERATIVE DIAGNOSIS: 1.  High grade left carotid stenosis ?2. Left ocular stroke ? ?POST-OPERATIVE DIAGNOSIS: same as above  ? ?SURGEON: Leotis Pain, MD ? ?ASSISTANT(S): none ? ?ANESTHESIA: general ? ?ESTIMATED BLOOD LOSS: 50 cc ? ?FINDING(S): ?1.  left carotid plaque. ? ?SPECIMEN(S):  Carotid plaque (sent to Pathology) ? ?INDICATIONS:   ?THALMUS BELOTTI is a 66 y.o. male who presents with visual changes and left carotid stenosis of >90%.  I discussed with the patient the risks, benefits, and alternatives to carotid endarterectomy.  I discussed the differences between carotid stenting and carotid endarterectomy. I discussed the procedural details of carotid endarterectomy with the patient.  The patient is aware that the risks of carotid endarterectomy include but are not limited to: bleeding, infection, stroke, myocardial infarction, death, cranial nerve injuries both temporary and permanent, neck hematoma, possible airway compromise, labile blood pressure post-operatively, cerebral hyperperfusion syndrome, and possible need for additional interventions in the future. The patient is aware of the risks and agrees to proceed forward with the procedure. ? ?DESCRIPTION: ?After full informed written consent was obtained from the patient, the patient was brought back to the operating room and placed supine upon the operating table.  Prior to induction, the patient received IV antibiotics.  After obtaining adequate anesthesia, the patient was placed into a modified beach chair position with a shoulder roll in place and the patient's neck slightly hyperextended and rotated away from the surgical site.  The patient was prepped in the standard fashion for a carotid endarterectomy.  I made an incision anterior to the sternocleidomastoid muscle and dissected down through the  subcutaneous tissue.  The platysmas was opened with electrocautery.  Then I dissected down to the internal jugular vein and facial vein.  The facial vein is ligated and divided between 2-0 silk ties.  This was dissected posteriorly until I obtained visualization of the common carotid artery.  This was dissected out and then a vessel loop was placed around the common carotid artery.  I then dissected in a periadventitial fashion along the common carotid artery up to the bifurcation.  I then identified the external carotid artery and the superior thyroid artery.  I placed a vessel loop around the superior thyroid artery, and I also dissected out the external carotid artery and placed a vessel loop around it. In the process of this dissection, the hypoglossal nerve was identified and protected from harm.  I then dissected out the internal carotid artery until I identified an area in the internal carotid artery clearly above the stenosis.  I dissected slightly distal to this area, and placed a vessel loop around the artery.  At this point, we gave the patient 6000 units of intravenous heparin.  After this was allowed to circulate for several minutes, I pulled up control on the vessel loops to clamp the internal carotid artery, external carotid artery, superior thyroid artery, and then the common carotid artery.  I then made an arteriotomy in the common carotid artery with a 11 blade, and extended the arteriotomy with a Potts scissor down into the common carotid artery, then I carried the arteriotomy through the bifurcation into the internal carotid artery until I reached an area that was not diseased.  At this point, I took the [place] shunt that previously been prepared and I inserted it into the internal carotid artery  first, and then into the common carotid artery taking care to flush and de-air prior to release of control. At this point, I started the endarterectomy in the common carotid artery with a  [place]field elevator and carried this dissection down into the common carotid artery circumferentially.  Then I transected the plaque at a segment where it was adherent and transected the plaque with Potts scissors.  I then carried this dissection up into the external carotid artery.  The plaque was extracted by unclamping the external carotid artery and performing an eversion endarterectomy.  The dissection was then carried into the internal carotid artery where a nice feathered end point was created with gentle traction.  I passed the plaque off the field as a specimen. At this point I removed all loose flecks and remaining disease possible.  At this point, I was satisfied that the minimal remaining disease was densely adherent to the wall and wall integrity was intact. The distal endpoint was tacked down with 7-0 Prolene sutures.  I then fashioned a CorMatrix arterial patch for the artery and sewed it in place with two running stitch of 6-0 Prolene.  I started at the distal endpoint and ran one half the length of the arteriotomy.  I then cut and beveled the patch to an appropriate length to match the arteriotomy.  I started the second 6-0 Prolene at the proximal end point.  The medial suture line was completed and the lateral suture line was run approximately one quarter the length of the arteriotomy.  Prior to completing this patch angioplasty, I removed the shunt first from the internal carotid artery, from which there was excellent backbleeding, and clamped it.  Then I removed the shunt from the common carotid artery, from which there was excellent antegrade bleeding, and then clamped it.  At this point, I allowed the external carotid artery to backbleed, which was excellent.  Then I instilled heparinized saline in this patched artery and then completed the patch angioplasty in the usual fashion.  First, I released the clamp on the external carotid artery, then I released it on the common carotid artery.  After  waiting a few seconds, I then released it on the internal carotid artery. Several minutes of pressure were held and 6-0 Prolene patch sutures were used as need for hemostasis.  At this point, I placed Surgicel and Evicel topical hemostatic agents.  There was no more active bleeding in the surgical site.  The sternocleidomastoid space was closed with three interrupted 3-0 Vicryl sutures. I then reapproximated the platysma muscle with a running stitch of 3-0 Vicryl.  The skin was then closed with a running subcuticular 4-0 Monocryl.  The skin was then cleaned, dried and Dermabond was used to reinforce the skin closure.  The patient awakened and was taken to the recovery room in stable condition, following commands and moving all four extremities without any apparent deficits. ?   ?COMPLICATIONS: none ? ?CONDITION: stable ? ?Leotis Pain ? ?01/28/2022, 4:31 PM ? ? ? ?This note was created with Dragon Medical transcription system. Any errors in dictation are purely unintentional.  ?

## 2022-01-28 NOTE — Interval H&P Note (Signed)
History and Physical Interval Note: ? ?01/28/2022 ?1:51 PM ? ?Tyler Russell  has presented today for surgery, with the diagnosis of carotid stenosis.  The various methods of treatment have been discussed with the patient and family. After consideration of risks, benefits and other options for treatment, the patient has consented to  Procedure(s): ?ENDARTERECTOMY CAROTID (Left) as a surgical intervention.  The patient's history has been reviewed, patient examined, no change in status, stable for surgery.  I have reviewed the patient's chart and labs.  Questions were answered to the patient's satisfaction.   ? ? ?Festus Barren ? ? ?

## 2022-01-28 NOTE — Progress Notes (Signed)
?  Progress Note ? ? ?Patient: Tyler Russell WLN:[id number] DOB: May 14, 1956 DOA: 01/26/2022     2 ?DOS: the patient was seen and examined on 01/28/2022 ?  ?Brief hospital course: ?No notes on file ? ?Assessment and Plan: ?* Retinal artery occlusion, branch, left ?Left vision loss from branch retinal artery occlusion from atheroembolic secondary to critical left carotid stenosis ?--dx by ophthalmology ?--neuro consulted ?Plan: ?--plan for L CEA with Vascular today, and transfer to ICU for monitoring after surgery. ?--cont ASA, resume plavix after surgery ?--cont statin (increased to Crestor 40 mg) ? ?DM2 (diabetes mellitus, type 2) (HCC) ?Patient with DM2 on metformin.  ?--A1c 7.5 ?Plan: ?--cont Farxiga (new) ? ?Essential hypertension ?BP adquately controlled. ?- Normotension blood pressure goal ?Plan: ?--cont amlodipine, Lisinopril and Lopressor ? ? ?Generalized anxiety disorder ?--cont Lexapro ? ?Mixed hyperlipidemia ?Changed from Pravachol to rosuvastatin 40 mg ?--cont rosuvastatin 40 mg ?--cont gemfibrozil ? ? ? ? ? ? ?  ? ?Subjective:  ?Pt continued to have vision loss in his left eye. ? ?Going for L CEA today. ? ? ?Physical Exam: ? ?Constitutional: NAD, AAOx3 ?HEENT: conjunctivae and lids normal, EOMI ?CV: No cyanosis.   ?RESP: normal respiratory effort, on RA ?Extremities: No effusions, edema in BLE ?SKIN: warm, dry ?Neuro: II - XII grossly intact.   ?Psych: Normal mood and affect.  Appropriate judgement and reason ? ? ?Data Reviewed: ? ?Family Communication:  ? ?Disposition: ?Status is: Inpatient ? ? Planned Discharge Destination: Home ? ? ? ?Time spent: 35 minutes ? ?Author: ?Darlin Priestly, MD ?01/28/2022 5:49 PM ? ?For on call review [email or web address].  ?

## 2022-01-28 NOTE — Anesthesia Procedure Notes (Signed)
Procedure Name: Intubation ?Date/Time: 01/28/2022 2:29 PM ?Performed by: Rosine Door, RN ?Pre-anesthesia Checklist: Patient identified, Emergency Drugs available, Suction available and Patient being monitored ?Patient Re-evaluated:Patient Re-evaluated prior to induction ?Oxygen Delivery Method: Circle system utilized ?Preoxygenation: Pre-oxygenation with 100% oxygen ?Induction Type: IV induction ?Ventilation: Mask ventilation without difficulty and Oral airway inserted - appropriate to patient size ?Laryngoscope Size: Mac and 3 ?Grade View: Grade II ?Tube type: Oral ?Tube size: 7.5 mm ?Number of attempts: 1 ?Airway Equipment and Method: Stylet, Oral airway and LTA kit utilized ?Placement Confirmation: ETT inserted through vocal cords under direct vision, positive ETCO2 and breath sounds checked- equal and bilateral ?Secured at: 21 cm ?Tube secured with: Tape ?Dental Injury: Teeth and Oropharynx as per pre-operative assessment  ? ? ? ? ?

## 2022-01-28 NOTE — Anesthesia Postprocedure Evaluation (Signed)
Anesthesia Post Note ? ?Patient: Tyler Russell ? ?Procedure(s) Performed: ENDARTERECTOMY CAROTID (Left) ? ?Patient location during evaluation: PACU ?Anesthesia Type: General ?Level of consciousness: awake and alert ?Pain management: pain level controlled ?Vital Signs Assessment: post-procedure vital signs reviewed and stable ?Respiratory status: spontaneous breathing, nonlabored ventilation, respiratory function stable and patient connected to nasal cannula oxygen ?Cardiovascular status: blood pressure returned to baseline and stable ?Postop Assessment: no apparent nausea or vomiting ?Anesthetic complications: no ? ? ?No notable events documented. ? ? ?Last Vitals:  ?Vitals:  ? 01/28/22 1700 01/28/22 1710  ?BP: (!) 94/54 116/66  ?Pulse: 69 70  ?Resp: 19 16  ?Temp:  36.6 ?C  ?SpO2: 99% 100%  ?  ?Last Pain:  ?Vitals:  ? 01/28/22 1710  ?TempSrc: Oral  ?PainSc:   ? ? ?  ?  ?  ?  ?  ?  ? ?Cleda Mccreedy Caid Radin ? ? ? ? ?

## 2022-01-28 NOTE — Progress Notes (Signed)
Noted left carotid critical stenosis.  He is undergoing CEA with plan to have it done today by Dr. Wyn Quaker.  He has a monocular field cut of both upper fields in the left eye.  With other explanation, I do not feel he needs prolonged cardiac monitoring.  Further management per vascular surgery. ? ?Neurology will be available on an as-needed basis. ? ?Ritta Slot, MD ?[facility] ?[phone] ? ?If 7pm- 7am, please page neurology on call as listed in [facility]. ? ?

## 2022-01-28 NOTE — Transfer of Care (Signed)
Immediate Anesthesia Transfer of Care Note ? ?Patient: Tyler Russell ? ?Procedure(s) Performed: ENDARTERECTOMY CAROTID (Left) ? ?Patient Location: PACU ? ?Anesthesia Type:General ? ?Level of Consciousness: drowsy and patient cooperative ? ?Airway & Oxygen Therapy: Patient Spontanous Breathing and Patient connected to face mask oxygen ? ?Post-op Assessment: Report given to RN and Post -op Vital signs reviewed and stable ? ?Post vital signs: Reviewed and stable ? ?Last Vitals:  ?Vitals Value Taken Time  ?BP 114/69 01/28/22 1630  ?Temp    ?Pulse 70 01/28/22 1632  ?Resp 17 01/28/22 1632  ?SpO2 100 % 01/28/22 1632  ?Vitals shown include unvalidated device data. ? ?Last Pain:  ?Vitals:  ? 01/28/22 1253  ?TempSrc: Oral  ?PainSc: 0-No pain  ?   ? ?  ? ?Complications: No notable events documented. ?

## 2022-01-28 NOTE — Anesthesia Preprocedure Evaluation (Signed)
Anesthesia Evaluation  ?Patient identified by MRN, date of birth, ID band ?Patient awake ? ? ? ?Reviewed: ?Allergy & Precautions, H&P , NPO status , Patient's Chart, lab work & pertinent test results, reviewed documented beta blocker date and time  ? ?History of Anesthesia Complications ?(+) history of anesthetic complications ? ?Airway ?Mallampati: II ? ?TM Distance: >3 FB ?Neck ROM: full ? ? ? Dental ? ?(+) Teeth Intact ?  ?Pulmonary ?neg pulmonary ROS, former smoker,  ?  ?Pulmonary exam normal ? ? ? ? ? ? ? Cardiovascular ?Exercise Tolerance: Poor ?hypertension, On Medications ?+ CAD and + Past MI  ?Normal cardiovascular exam ?Rhythm:regular Rate:Normal ? ? ?  ?Neuro/Psych ? Headaches, PSYCHIATRIC DISORDERS Anxiety   ? GI/Hepatic ?Neg liver ROS, GERD  Medicated,  ?Endo/Other  ?negative endocrine ROSdiabetes, Well Controlled, Type 2, Oral Hypoglycemic Agents ? Renal/GU ?Renal disease  ?negative genitourinary ?  ?Musculoskeletal ? ? Abdominal ?  ?Peds ? Hematology ?negative hematology ROS ?(+)   ?Anesthesia Other Findings ?Past Medical History: ?No date: Alcohol abuse ?No date: Arthritis ?No date: Chronic back pain ?No date: Chronic kidney disease ?No date: Complication of anesthesia ?    Comment:  had difficulty with breathing, had difficulty waking up ?No date: Coronary artery disease ?No date: Diabetes mellitus without complication ([place]) ?    Comment:  type 2 ?No date: GERD (gastroesophageal reflux disease) ?No date: Gonorrhea ?No date: Headache(784.0) ?No date: History of kidney stones ?No date: Hypercholesteremia ?    Comment:  no medication at this time ?No date: Hypertension ?No date: Trigger finger ?    Comment:  on both hands ?No date: Ulcer ?    Comment:  on medication ?Past Surgical History: ?08/28/2011: ANTERIOR LAT LUMBAR FUSION ?    Comment:  Procedure: ANTERIOR LATERAL LUMBAR FUSION 1 LEVEL;   ?             Surgeon: Dahlia Bailiff;  Location: [facility];  Service:  ?              Orthopedics;  Laterality: N/A;  Lateral L3-4 Fusion  ?             Posterior Spinal Fusion L3-4, Possible Removal of  ?             Hardware  ?02/27/2016: CARDIAC CATHETERIZATION; N/A ?    Comment:  Procedure: Left Heart Cath and Coronary Angiography;   ?             Surgeon: Corey Skains, MD;  Location: [facility]  ?             [facility];  Service: Cardiovascular;  Laterality: N/A; ?05/04/2019: CARPAL TUNNEL RELEASE; Left ?02/29/2020: CARPAL TUNNEL RELEASE; Right ?No date: CERVICAL DISC SURGERY ?    Comment:  3 ruptured disc, 2012 ?2017: COLONOSCOPY ?02/29/2016: CORONARY ARTERY BYPASS GRAFT; N/A ?    Comment:  Procedure: CORONARY ARTERY BYPASS GRAFTING (CABG), ON  ?             PUMP, TIMES THREE, USING LEFT INTERNAL MAMMARY ARTERY,  ?             RIGHT GREATER SAPHENOUS VEIN HARVESTED ENDOSCOPICALLY;   ?             Surgeon: Gaye Pollack, MD;  Location: [place];  Service:  ?             Open Heart Surgery;  Laterality: N/A;  LIMA to LAD, SVG  ?  to RAMUS INTERMEDIATE, SVG to OM ?11/23/2015: ESOPHAGOGASTRODUODENOSCOPY (EGD) WITH PROPOFOL; N/A ?    Comment:  Procedure: ESOPHAGOGASTRODUODENOSCOPY (EGD) WITH  ?             PROPOFOL;  Surgeon: Lollie Sails, MD;  Location:  ?             [facility];  Service: Endoscopy;  Laterality: N/A; ?No date: KNEE ARTHROSCOPY; Left ?2008: LUMBAR FUSION ?05/31/2021: SHOULDER ARTHROSCOPY WITH SUBACROMIAL DECOMPRESSION,  ?ROTATOR CUFF REPAIR AND BICEP TENDON REPAIR; Left ?    Comment:  Procedure: Left shoulder arthroscopic subscapularis  ?             repair, mini-open rotator cuff repair, subacromial  ?             decompression, and biceps tenodesis;  Surgeon: Posey Pronto,  ?             Tarry Kos, MD;  Location: [facility];  Service:  ?             Orthopedics;  Laterality: Left; ?02/29/2016: TEE WITHOUT CARDIOVERSION; N/A ?    Comment:  Procedure: TRANSESOPHAGEAL ECHOCARDIOGRAM (TEE);   ?             Surgeon: Gaye Pollack, MD;  Location: [facility];   Service:  ?             Open Heart Surgery;  Laterality: N/A; ?09/15/1981: VASECTOMY ?BMI   ? Body Mass Index: 28.62 kg/m?  ?  ? Reproductive/Obstetrics ?negative OB ROS ? ?  ? ? ? ? ? ? ? ? ? ? ? ? ? ?  ?  ? ? ? ? ? ? ? ? ?Anesthesia Physical ?Anesthesia Plan ? ?ASA: 4 ? ?Anesthesia Plan: General ETT  ? ?Post-op Pain Management:   ? ?Induction:  ? ?PONV Risk Score and Plan: 3 ? ?Airway Management Planned:  ? ?Additional Equipment:  ? ?Intra-op Plan:  ? ?Post-operative Plan:  ? ?Informed Consent: I have reviewed the patients History and Physical, chart, labs and discussed the procedure including the risks, benefits and alternatives for the proposed anesthesia with the patient or authorized representative who has indicated his/her understanding and acceptance.  ? ? ? ?Dental Advisory Given ? ?Plan Discussed with: CRNA ? ?Anesthesia Plan Comments:   ? ? ? ? ? ? ?Anesthesia Quick Evaluation ? ?

## 2022-01-29 ENCOUNTER — Encounter: Payer: Self-pay | Admitting: Vascular Surgery

## 2022-01-29 DIAGNOSIS — E1169 Type 2 diabetes mellitus with other specified complication: Secondary | ICD-10-CM

## 2022-01-29 DIAGNOSIS — H34232 Retinal artery branch occlusion, left eye: Secondary | ICD-10-CM | POA: Diagnosis not present

## 2022-01-29 DIAGNOSIS — E782 Mixed hyperlipidemia: Secondary | ICD-10-CM | POA: Diagnosis not present

## 2022-01-29 LAB — CBC
HCT: 36.2 % — ABNORMAL LOW (ref 39.0–52.0)
Hemoglobin: 11.9 g/dL — ABNORMAL LOW (ref 13.0–17.0)
MCH: 27.2 pg (ref 26.0–34.0)
MCHC: 32.9 g/dL (ref 30.0–36.0)
MCV: 82.8 fL (ref 80.0–100.0)
Platelets: 286 K/uL (ref 150–400)
RBC: 4.37 MIL/uL (ref 4.22–5.81)
RDW: 13.8 % (ref 11.5–15.5)
WBC: 12.9 K/uL — ABNORMAL HIGH (ref 4.0–10.5)
nRBC: 0 % (ref 0.0–0.2)

## 2022-01-29 LAB — BASIC METABOLIC PANEL
Anion gap: 10 (ref 5–15)
BUN: 21 mg/dL (ref 8–23)
CO2: 17 mmol/L — ABNORMAL LOW (ref 22–32)
Calcium: 8.2 mg/dL — ABNORMAL LOW (ref 8.9–10.3)
Chloride: 110 mmol/L (ref 98–111)
Creatinine, Ser: 0.96 mg/dL (ref 0.61–1.24)
GFR, Estimated: >60 mL/min (ref >60)
Glucose, Bld: 144 mg/dL — ABNORMAL HIGH (ref 70–99)
Potassium: 4.7 mmol/L (ref 3.5–5.1)
Sodium: 137 mmol/L (ref 135–145)

## 2022-01-29 LAB — MAGNESIUM: Magnesium: 2.5 mg/dL — ABNORMAL HIGH (ref 1.7–2.4)

## 2022-01-29 LAB — GLUCOSE, CAPILLARY
Glucose-Capillary: 163 mg/dL — ABNORMAL HIGH (ref 70–99)
Glucose-Capillary: 129 mg/dL — ABNORMAL HIGH (ref 70–99)
Glucose-Capillary: 139 mg/dL — ABNORMAL HIGH (ref 70–99)

## 2022-01-29 MED ORDER — VITAMIN B-12 1000 MCG PO TABS: 1000.0000 ug | ORAL_TABLET | Freq: Every day | ORAL | Status: DC

## 2022-01-29 MED ORDER — CYANOCOBALAMIN 1000 MCG PO TABS
1000.0000 ug | ORAL_TABLET | Freq: Every day | ORAL | 0 refills | Status: DC
Start: 2022-01-30 — End: 2022-01-29

## 2022-01-29 MED ORDER — CYANOCOBALAMIN 1000 MCG/ML IJ SOLN
1000.0000 ug | Freq: Once | INTRAMUSCULAR | Status: AC
  Administered 2022-01-29: 1000 ug via INTRAMUSCULAR
  Filled 2022-01-29: qty 1

## 2022-01-29 MED ORDER — ROSUVASTATIN CALCIUM 40 MG PO TABS: 40.0000 mg | ORAL_TABLET | Freq: Every day | ORAL | 0 refills | Status: DC

## 2022-01-29 MED ORDER — CLOPIDOGREL BISULFATE 75 MG PO TABS: 75.0000 mg | ORAL_TABLET | Freq: Every day | ORAL | 0 refills | Status: AC

## 2022-01-29 MED ORDER — CYANOCOBALAMIN 1000 MCG PO TABS: 1000.0000 ug | ORAL_TABLET | Freq: Every day | ORAL | 0 refills | Status: AC

## 2022-01-29 MED ORDER — CLOPIDOGREL BISULFATE 75 MG PO TABS: 75.0000 mg | ORAL_TABLET | Freq: Every day | ORAL | 0 refills | Status: DC

## 2022-01-29 NOTE — Discharge Summary (Signed)
Physician Discharge Summary  ?Tyler Russell PZW:[id number] DOB: 28-Jul-1956 DOA: 01/26/2022 ? ?PCP: Kirk Ruths, MD ? ?Admit date: 01/26/2022 ?Discharge date: 01/29/2022 ? ?Admitted From: home  ?Disposition:  home  ? ?Recommendations for Outpatient Follow-up:  ?Follow up with PCP in 1-2 weeks ?F/u w/ vasc surg, Dr. Lucky Cowboy, in 3-4 weeks ?F/u w/ opth, Dr. Merleen Nicely, in 1 month ?Do not drive until cleared by optho ? ?Home Health:  ?Equipment/Devices: ? ?Discharge Condition: stable  ?CODE STATUS: full  ?Diet recommendation: Heart Healthy / Carb Modified  ? ?Brief/Interim Summary: ?HPI was taken from Dr. Linda Hedges: ?Mr. Payes, a 66 y/o, with DM, HLD, HTN, CAD was in his usual health 01/25/22. He was active and mowed. AT 2200 while showering he noted visual loss left eye - a cludy change in vision. Due to persistent visual change he presented to [facility] for evaluation.  ?  ?ED Course: Vitals stable. Code stroke initiated. Lab: Glucose 141. CT head w/o acute findings or hemorrhage, CTA with critical Left carotid stenosis. Exam per EDP notable for OS visual loss. Ophthalmology and neurology consulted. TRH called to admit to complete stroke workup. ?  ?As per Dr. Jimmye Norman 01/29/22: Please see previous progress notes from hospital course before 01/29/22. Pt tolerated his left carotid endarterectomy w/ vasc surg well on 01/28/22 and was cleared by vasc surg the following day. The pt was d/c home on aspirin 81 mg daily, plavix $RemoveBefor'75mg'[id number]$  daily & rouvastatin $RemoveBefor'40mg'[id number]$  daily. Pt will f/u w/ vasc surg in 3-4 weeks and w/ opthalmology, Dr. Merleen Nicely, in 1 month. For more information, please see previous progress/consult notes.  ? ? ?Discharge Diagnoses:  ?Principal Problem: ?  Retinal artery occlusion, branch, left ?Active Problems: ?  Essential hypertension ?  DM2 (diabetes mellitus, type 2) ([place]) ?  Mixed hyperlipidemia ?  Generalized anxiety disorder ? ?Left retinal artery occlusion: secondary to left carotid stenosis. Continue on  aspirin, statin, plavix. Dx by ophthalmology. S/p L CEA as per vascu surg. Neuro following and recs apprec.  ? ?DM2: poorly controlled, HbA1c 7.5. Continue on metformin  ?  ?HTN: continue on amlodipine, lisinopril, metoprolol  ?  ?Generalized anxiety disorder: severity unknown. Continue on home dose of lexapro  ?  ?HLD: continue on statin, gemfibrozil  ? ?Leukocytosis: likely reactive  ? ?Discharge Instructions ? ?Discharge Instructions   ? ? Diet - low sodium heart healthy   Complete by: As directed ?  ? Diet Carb Modified   Complete by: As directed ?  ? Discharge instructions   Complete by: As directed ?  ? F/u w/ opthalmology, Dr. Merleen Nicely, in 1 month. Do NOT drive until cleared by opthalmology. F/u w/ PCP in 1-2 weeks. F/u w/ vascular surg, Dr. Lucky Cowboy, in 3-4 weeks  ? Increase activity slowly   Complete by: As directed ?  ? No wound care   Complete by: As directed ?  ? ?  ? ?Allergies as of 01/29/2022   ? ?   Reactions  ? Atorvastatin   ? Other reaction(s): Muscle Pain  ? Vicodin [hydrocodone-acetaminophen] Itching  ? Lactalbumin   ? Other reaction(s): Other (See Comments) ?Sneezing and post nasal drip  ? Milk-related Compounds Other (See Comments)  ? Sneezing and post nasal drip  ? ?  ? ?  ?Medication List  ?  ? ?STOP taking these medications   ? ?brompheniramine-pseudoephedrine-DM 30-2-10 MG/5ML syrup ?  ?losartan 100 MG tablet ?Commonly known as: COZAAR ?  ?omeprazole 20 MG capsule ?Commonly known  as: PRILOSEC ?  ?ondansetron 4 MG disintegrating tablet ?Commonly known as: Zofran ODT ?  ?oxyCODONE 5 MG immediate release tablet ?Commonly known as: Roxicodone ?  ?pravastatin 10 MG tablet ?Commonly known as: PRAVACHOL ?  ? ?  ? ?TAKE these medications   ? ?acetaminophen 500 MG tablet ?Commonly known as: TYLENOL ?Take 2 tablets (1,000 mg total) by mouth every 8 (eight) hours. ?  ?amLODipine 10 MG tablet ?Commonly known as: NORVASC ?Take 10 mg by mouth daily. ?  ?aspirin 81 MG EC tablet ?Take 81 mg by mouth  daily. ?  ?blood glucose meter kit and supplies ?Dispense based on patient and insurance preference. Use up to four times daily as directed. (FOR ICD-9 250.00, 250.01). ?  ?cetirizine 10 MG tablet ?Commonly known as: ZYRTEC ?Take 10 mg by mouth daily. ?  ?clopidogrel 75 MG tablet ?Commonly known as: PLAVIX ?Take 1 tablet (75 mg total) by mouth daily at 6 (six) AM. ?Start taking on: Jan 30, 2022 ?  ?cyanocobalamin 1000 MCG tablet ?Take 1 tablet (1,000 mcg total) by mouth daily. ?Start taking on: Jan 30, 2022 ?  ?diphenhydrAMINE 25 MG tablet ?Commonly known as: BENADRYL ?Take 25 mg by mouth every 6 (six) hours as needed. ?  ?escitalopram 10 MG tablet ?Commonly known as: LEXAPRO ?Take 1 tablet (10 mg total) by mouth daily. ?  ?fluticasone 50 MCG/ACT nasal spray ?Commonly known as: FLONASE ?Place 2 sprays into the nose daily as needed. ?  ?gemfibrozil 600 MG tablet ?Commonly known as: LOPID ?Take 600 mg by mouth 2 (two) times daily before a meal. ?  ?lisinopril 2.5 MG tablet ?Commonly known as: Zestril ?Take 1 tablet (2.5 mg total) by mouth daily. ?  ?metFORMIN 850 MG tablet ?Commonly known as: GLUCOPHAGE ?Take 1 tablet (850 mg total) by mouth 2 (two) times daily with a meal. ?  ?metoprolol tartrate 50 MG tablet ?Commonly known as: LOPRESSOR ?Take 1 tablet (50 mg total) by mouth 2 (two) times daily. ?  ?pantoprazole 20 MG tablet ?Commonly known as: PROTONIX ?Take 20 mg by mouth daily. ?  ?rosuvastatin 40 MG tablet ?Commonly known as: CRESTOR ?Take 1 tablet (40 mg total) by mouth daily. ?Start taking on: Jan 30, 2022 ?  ? ?  ? ? Follow-up Information   ? ? Kris Hartmann, NP Follow up in 4 week(s).   ?Specialty: Vascular Surgery ?Why: with carotid duplex ?Contact information: ?[address] ?[place] [address] ?[phone] ? ? ?  ?  ? ?  ?  ? ?  ? ?Allergies  ?Allergen Reactions  ? Atorvastatin   ?  Other reaction(s): Muscle Pain  ? Vicodin [Hydrocodone-Acetaminophen] Itching  ? Lactalbumin   ?  Other reaction(s):  Other (See Comments) ?Sneezing and post nasal drip  ? Milk-Related Compounds Other (See Comments)  ?  Sneezing and post nasal drip  ? ? ?Consultations: ?Vasc surg ?Optho  ? ? ?Procedures/Studies: ?CT ANGIO HEAD NECK W WO CM ? ?Result Date: 01/26/2022 ?CLINICAL DATA:  Sudden monocular vision loss affecting the left eye EXAM: CT ANGIOGRAPHY HEAD AND NECK TECHNIQUE: Multidetector CT imaging of the head and neck was performed using the standard protocol during bolus administration of intravenous contrast. Multiplanar CT image reconstructions and MIPs were obtained to evaluate the vascular anatomy. Carotid stenosis measurements (when applicable) are obtained utilizing NASCET criteria, using the distal internal carotid diameter as the denominator. RADIATION DOSE REDUCTION: This exam was performed according to the departmental dose-optimization program which includes automated exposure control, adjustment  of the mA and/or kV according to patient size and/or use of iterative reconstruction technique. CONTRAST:  42mL OMNIPAQUE IOHEXOL 350 MG/ML SOLN COMPARISON:  Head CT earlier the same day FINDINGS: CTA NECK FINDINGS Aortic arch: Atheromatous plaque with 3 vessel branching. Right carotid system: Atheromatous wall thickening of the common and internal carotid arteries. Atheromatous narrowing of the proximal ICA measures 40%. Left carotid system: Atheromatous wall thickening of the common carotid with prominent plaque deposition at the bifurcation causing critical stenosis of the ICA, lumen measuring 1 mm or less with possible downstream underfilling when compared to the right. No detectable ulceration. Vertebral arteries: Proximal subclavian atherosclerosis. The vertebral arteries are smoothly contoured and diffusely patent Skeleton: Cervical spine degeneration with multilevel solid fusion. Other neck: No acute finding Upper chest: No acute finding Review of the MIP images confirms the above findings CTA HEAD FINDINGS  Anterior circulation: Atheromatous calcification along the carotid siphons. Allowing for calcified plaque blooming no flow reducing stenosis is seen. Both ophthalmic arteries are enhancing proximally. Posterior circulati

## 2022-01-29 NOTE — Progress Notes (Signed)
[facility] ? ?Daily Progress Note ? ? ?Subjective  -  ? ?Patient doing well.  Swallowing easily without difficulty.  No neurologic symptoms of note.  Neck with minimal swelling and pain ? ?Objective ?Vitals:  ? 01/29/22 1000 01/29/22 1102 01/29/22 1200 01/29/22 1212  ?BP: (!) 92/58 (!) 100/55 97/60 97/60   ?Pulse: 71 74 70 70  ?Resp: 16 15 16 16   ?Temp:    98.3 ?F (36.8 ?C)  ?TempSrc:      ?SpO2: 97% 96% 95% 95%  ?Weight:      ?Height:      ? ? ?Intake/Output Summary (Last 24 hours) at 01/29/2022 1230 ?Last data filed at 01/29/2022 1123 ?Gross per 24 hour  ?Intake 3630.68 ml  ?Output 2300 ml  ?Net 1330.68 ml  ? ? ?PULM  CTAB ?CV  RRR ?VASC  neck incision is clean, dry, and intact ? ?Laboratory ?CBC ?   ?Component Value Date/Time  ? WBC 12.9 (H) 01/29/2022 0703  ? HGB 11.9 (L) 01/29/2022 0703  ? HGB 10.9 (L) 03/15/2012 2342  ? HCT 36.2 (L) 01/29/2022 0703  ? HCT 35.0 (L) 03/15/2012 2342  ? PLT 286 01/29/2022 0703  ? PLT 338 03/15/2012 2342  ? ? ?BMET ?   ?Component Value Date/Time  ? NA 137 01/29/2022 0526  ? NA 140 03/15/2012 2342  ? K 4.7 01/29/2022 0526  ? K 3.2 (L) 03/15/2012 2342  ? CL 110 01/29/2022 0526  ? CL 106 03/15/2012 2342  ? CO2 17 (L) 01/29/2022 0526  ? CO2 23 03/15/2012 2342  ? GLUCOSE 144 (H) 01/29/2022 0526  ? GLUCOSE 253 (H) 03/15/2012 2342  ? BUN 21 01/29/2022 0526  ? BUN 19 (H) 03/15/2012 2342  ? CREATININE 0.96 01/29/2022 0526  ? CREATININE 1.20 03/15/2012 2342  ? CALCIUM 8.2 (L) 01/29/2022 0526  ? CALCIUM 7.6 (L) 03/15/2012 2342  ? GFRNONAA >60 01/29/2022 0526  ? GFRNONAA >60 03/15/2012 2342  ? GFRAA >60 03/03/2016 0226  ? GFRAA >60 03/15/2012 2342  ? ? ?Assessment/Planning: ?POD #1 s/p left carotid endarterectomy ? ?Doing well ?Okay to have regular diet and normal activity from my point of view ?Should go home on aspirin 81 mg daily, Plavix 75 mg daily, and would prefer a statin agent.  He has previously had issues with atorvastatin so may be try Crestor 5 mg  daily. ?Follow-up in our office in 3 to 4 weeks with carotid duplex ? ? ?2343 ? ?01/29/2022, 12:30 PM ? ? ? ?  ?

## 2022-01-29 NOTE — Progress Notes (Signed)
Patient discharged home by way of PMV with daughter. Vitals WDL at time of discharge. All paperwork reviewed with patient and questions answered. Ambulatory oxygenation completed in room by this RN. Saturation remained >94%. ?MD Mayford Knife requested and aware of O2 ambulation results.  ?

## 2022-01-30 LAB — TYPE AND SCREEN
ABO/RH(D): A POS
Antibody Screen: NEGATIVE

## 2022-01-30 LAB — SURGICAL PATHOLOGY

## 2022-02-07 LAB — METHYLMALONIC ACID, SERUM: Methylmalonic Acid, Quantitative: 137 nmol/L (ref 0–378)

## 2022-02-24 ENCOUNTER — Other Ambulatory Visit (INDEPENDENT_AMBULATORY_CARE_PROVIDER_SITE_OTHER): Payer: Self-pay | Admitting: Vascular Surgery

## 2022-02-24 DIAGNOSIS — I6522 Occlusion and stenosis of left carotid artery: Secondary | ICD-10-CM

## 2022-02-24 DIAGNOSIS — Z9889 Other specified postprocedural states: Secondary | ICD-10-CM

## 2022-02-26 ENCOUNTER — Encounter (INDEPENDENT_AMBULATORY_CARE_PROVIDER_SITE_OTHER): Payer: Self-pay

## 2022-02-26 ENCOUNTER — Ambulatory Visit (INDEPENDENT_AMBULATORY_CARE_PROVIDER_SITE_OTHER): Payer: Medicare PPO | Admitting: Nurse Practitioner

## 2022-09-18 DIAGNOSIS — H9202 Otalgia, left ear: Secondary | ICD-10-CM | POA: Diagnosis not present

## 2022-09-18 DIAGNOSIS — J40 Bronchitis, not specified as acute or chronic: Secondary | ICD-10-CM | POA: Diagnosis not present

## 2022-09-18 DIAGNOSIS — Z03818 Encounter for observation for suspected exposure to other biological agents ruled out: Secondary | ICD-10-CM | POA: Diagnosis not present

## 2022-09-18 DIAGNOSIS — R062 Wheezing: Secondary | ICD-10-CM | POA: Diagnosis not present

## 2022-11-06 DIAGNOSIS — I779 Disorder of arteries and arterioles, unspecified: Secondary | ICD-10-CM | POA: Diagnosis not present

## 2022-11-06 DIAGNOSIS — I25810 Atherosclerosis of coronary artery bypass graft(s) without angina pectoris: Secondary | ICD-10-CM | POA: Diagnosis not present

## 2022-11-06 DIAGNOSIS — I10 Essential (primary) hypertension: Secondary | ICD-10-CM | POA: Diagnosis not present

## 2022-11-06 DIAGNOSIS — E1159 Type 2 diabetes mellitus with other circulatory complications: Secondary | ICD-10-CM | POA: Diagnosis not present

## 2022-12-04 DIAGNOSIS — M65351 Trigger finger, right little finger: Secondary | ICD-10-CM | POA: Diagnosis not present

## 2023-02-26 DIAGNOSIS — M7581 Other shoulder lesions, right shoulder: Secondary | ICD-10-CM | POA: Diagnosis not present

## 2023-03-24 ENCOUNTER — Ambulatory Visit: Payer: Self-pay

## 2023-03-24 NOTE — Patient Outreach (Signed)
  Care Coordination   03/24/2023 Name: [name] MRN: [id number] DOB: 1956/03/15   Care Coordination Outreach Attempts:  An unsuccessful telephone outreach was attempted today to offer the patient information about available care coordination services.  Follow Up Plan:  Additional outreach attempts will be made to offer the patient care coordination information and services.   Encounter Outcome:  No Answer   Care Coordination Interventions:  No, not indicated    SIG Lysle Morales, BSW Social Worker [facility] Care Management  [phone]

## 2023-04-08 DIAGNOSIS — M19011 Primary osteoarthritis, right shoulder: Secondary | ICD-10-CM | POA: Diagnosis not present

## 2023-04-29 DIAGNOSIS — R413 Other amnesia: Secondary | ICD-10-CM | POA: Diagnosis not present

## 2023-04-29 DIAGNOSIS — I10 Essential (primary) hypertension: Secondary | ICD-10-CM | POA: Diagnosis not present

## 2023-04-29 DIAGNOSIS — I25810 Atherosclerosis of coronary artery bypass graft(s) without angina pectoris: Secondary | ICD-10-CM | POA: Diagnosis not present

## 2023-04-29 DIAGNOSIS — E782 Mixed hyperlipidemia: Secondary | ICD-10-CM | POA: Diagnosis not present

## 2023-04-30 ENCOUNTER — Emergency Department: Payer: Medicare Other

## 2023-04-30 ENCOUNTER — Other Ambulatory Visit: Payer: Self-pay

## 2023-04-30 ENCOUNTER — Emergency Department
Admission: EM | Admit: 2023-04-30 | Discharge: 2023-04-30 | Disposition: A | Discharge status: Home / Self Care | Payer: Medicare Other | Attending: Emergency Medicine | Admitting: Emergency Medicine

## 2023-04-30 ENCOUNTER — Encounter: Payer: Self-pay | Admitting: Emergency Medicine

## 2023-04-30 DIAGNOSIS — M545 Low back pain, unspecified: Secondary | ICD-10-CM | POA: Diagnosis not present

## 2023-04-30 DIAGNOSIS — S0990XA Unspecified injury of head, initial encounter: Secondary | ICD-10-CM | POA: Diagnosis not present

## 2023-04-30 DIAGNOSIS — R519 Headache, unspecified: Secondary | ICD-10-CM | POA: Diagnosis not present

## 2023-04-30 DIAGNOSIS — Y92410 Unspecified street and highway as the place of occurrence of the external cause: Secondary | ICD-10-CM | POA: Insufficient documentation

## 2023-04-30 DIAGNOSIS — M47816 Spondylosis without myelopathy or radiculopathy, lumbar region: Secondary | ICD-10-CM | POA: Diagnosis not present

## 2023-04-30 DIAGNOSIS — R42 Dizziness and giddiness: Secondary | ICD-10-CM | POA: Diagnosis not present

## 2023-04-30 DIAGNOSIS — M5126 Other intervertebral disc displacement, lumbar region: Secondary | ICD-10-CM | POA: Diagnosis not present

## 2023-04-30 DIAGNOSIS — Z041 Encounter for examination and observation following transport accident: Secondary | ICD-10-CM | POA: Diagnosis not present
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
## 2023-04-30 NOTE — Discharge Instructions (Signed)
You can take Tylenol for pain at home.
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
## 2023-05-06 ENCOUNTER — Telehealth: Payer: Self-pay

## 2023-05-06 NOTE — Telephone Encounter (Signed)
Transition Care Management Unsuccessful Follow-up Telephone Call  Date of discharge and from where:  [place] 8/15  Attempts:  1st Attempt  Reason for unsuccessful TCM follow-up call:  No answer/busy   Tyler Russell  [facility], [facility] Guide, Phone: [phone] Website: [email or web address]

## 2023-05-07 ENCOUNTER — Telehealth: Payer: Self-pay

## 2023-05-07 DIAGNOSIS — M5489 Other dorsalgia: Secondary | ICD-10-CM | POA: Diagnosis not present

## 2023-05-07 NOTE — Telephone Encounter (Signed)
Transition Care Management Unsuccessful Follow-up Telephone Call  Date of discharge and from where:  [place] 8/15  Attempts:  2nd Attempt  Reason for unsuccessful TCM follow-up call:  No answer/busy   Lenard Forth Callender  [facility], [facility] Guide, Phone: [phone] Website: [email or web address]

## 2023-05-08 ENCOUNTER — Other Ambulatory Visit: Payer: Self-pay | Admitting: Internal Medicine

## 2023-05-08 ENCOUNTER — Ambulatory Visit
Admission: RE | Admit: 2023-05-08 | Discharge: 2023-05-08 | Disposition: A | Discharge status: Home / Self Care | Payer: Medicare Other | Source: Ambulatory Visit | Attending: Internal Medicine | Admitting: Internal Medicine

## 2023-05-08 DIAGNOSIS — M5489 Other dorsalgia: Secondary | ICD-10-CM

## 2023-05-08 DIAGNOSIS — M5136 Other intervertebral disc degeneration, lumbar region: Secondary | ICD-10-CM | POA: Diagnosis not present

## 2023-05-08 DIAGNOSIS — M549 Dorsalgia, unspecified: Secondary | ICD-10-CM | POA: Diagnosis not present

## 2023-05-08 DIAGNOSIS — M47816 Spondylosis without myelopathy or radiculopathy, lumbar region: Secondary | ICD-10-CM | POA: Diagnosis not present

## 2023-05-08 DIAGNOSIS — M48061 Spinal stenosis, lumbar region without neurogenic claudication: Secondary | ICD-10-CM | POA: Diagnosis not present

## 2023-07-17 ENCOUNTER — Encounter
Admission: EM | Disposition: A | Discharge status: Home / Self Care | Payer: Self-pay | Source: Home / Self Care | Attending: Internal Medicine

## 2023-07-17 ENCOUNTER — Inpatient Hospital Stay
Admit: 2023-07-17 | Discharge: 2023-07-17 | Disposition: A | Discharge status: Home / Self Care | Payer: Medicare HMO | Attending: Cardiology

## 2023-07-17 ENCOUNTER — Other Ambulatory Visit: Payer: Self-pay

## 2023-07-17 ENCOUNTER — Inpatient Hospital Stay
Admission: EM | Admit: 2023-07-17 | Discharge: 2023-07-18 | DRG: 322 | Disposition: A | Discharge status: Home / Self Care | Payer: Medicare HMO | Attending: Osteopathic Medicine | Admitting: Osteopathic Medicine

## 2023-07-17 ENCOUNTER — Encounter: Payer: Self-pay | Admitting: Emergency Medicine

## 2023-07-17 ENCOUNTER — Emergency Department: Payer: Medicare HMO

## 2023-07-17 DIAGNOSIS — Z8249 Family history of ischemic heart disease and other diseases of the circulatory system: Secondary | ICD-10-CM

## 2023-07-17 DIAGNOSIS — Z87442 Personal history of urinary calculi: Secondary | ICD-10-CM

## 2023-07-17 DIAGNOSIS — Z981 Arthrodesis status: Secondary | ICD-10-CM

## 2023-07-17 DIAGNOSIS — Z951 Presence of aortocoronary bypass graft: Secondary | ICD-10-CM

## 2023-07-17 DIAGNOSIS — E782 Mixed hyperlipidemia: Secondary | ICD-10-CM | POA: Diagnosis not present

## 2023-07-17 DIAGNOSIS — Z8673 Personal history of transient ischemic attack (TIA), and cerebral infarction without residual deficits: Secondary | ICD-10-CM

## 2023-07-17 DIAGNOSIS — Z91011 Allergy to milk products: Secondary | ICD-10-CM | POA: Diagnosis not present

## 2023-07-17 DIAGNOSIS — Z881 Allergy status to other antibiotic agents status: Secondary | ICD-10-CM | POA: Diagnosis not present

## 2023-07-17 DIAGNOSIS — Z7984 Long term (current) use of oral hypoglycemic drugs: Secondary | ICD-10-CM | POA: Diagnosis not present

## 2023-07-17 DIAGNOSIS — Z955 Presence of coronary angioplasty implant and graft: Secondary | ICD-10-CM | POA: Diagnosis not present

## 2023-07-17 DIAGNOSIS — Z888 Allergy status to other drugs, medicaments and biological substances status: Secondary | ICD-10-CM

## 2023-07-17 DIAGNOSIS — G8929 Other chronic pain: Secondary | ICD-10-CM | POA: Diagnosis present

## 2023-07-17 DIAGNOSIS — E1151 Type 2 diabetes mellitus with diabetic peripheral angiopathy without gangrene: Secondary | ICD-10-CM | POA: Diagnosis present

## 2023-07-17 DIAGNOSIS — E1165 Type 2 diabetes mellitus with hyperglycemia: Secondary | ICD-10-CM | POA: Diagnosis present

## 2023-07-17 DIAGNOSIS — I214 Non-ST elevation (NSTEMI) myocardial infarction: Secondary | ICD-10-CM | POA: Diagnosis not present

## 2023-07-17 DIAGNOSIS — Z79899 Other long term (current) drug therapy: Secondary | ICD-10-CM

## 2023-07-17 DIAGNOSIS — R0602 Shortness of breath: Secondary | ICD-10-CM | POA: Diagnosis not present

## 2023-07-17 DIAGNOSIS — E871 Hypo-osmolality and hyponatremia: Secondary | ICD-10-CM | POA: Diagnosis present

## 2023-07-17 DIAGNOSIS — Z7982 Long term (current) use of aspirin: Secondary | ICD-10-CM

## 2023-07-17 DIAGNOSIS — Z87891 Personal history of nicotine dependence: Secondary | ICD-10-CM | POA: Diagnosis not present

## 2023-07-17 DIAGNOSIS — R918 Other nonspecific abnormal finding of lung field: Secondary | ICD-10-CM | POA: Diagnosis not present

## 2023-07-17 DIAGNOSIS — J449 Chronic obstructive pulmonary disease, unspecified: Secondary | ICD-10-CM | POA: Diagnosis present

## 2023-07-17 DIAGNOSIS — M549 Dorsalgia, unspecified: Secondary | ICD-10-CM | POA: Diagnosis present

## 2023-07-17 DIAGNOSIS — I10 Essential (primary) hypertension: Secondary | ICD-10-CM | POA: Diagnosis present

## 2023-07-17 DIAGNOSIS — Z885 Allergy status to narcotic agent status: Secondary | ICD-10-CM | POA: Diagnosis not present

## 2023-07-17 DIAGNOSIS — K219 Gastro-esophageal reflux disease without esophagitis: Secondary | ICD-10-CM | POA: Diagnosis present

## 2023-07-17 DIAGNOSIS — E876 Hypokalemia: Secondary | ICD-10-CM | POA: Diagnosis present

## 2023-07-17 DIAGNOSIS — I2510 Atherosclerotic heart disease of native coronary artery without angina pectoris: Secondary | ICD-10-CM | POA: Diagnosis present

## 2023-07-17 DIAGNOSIS — I739 Peripheral vascular disease, unspecified: Secondary | ICD-10-CM | POA: Diagnosis not present

## 2023-07-17 HISTORY — PX: LEFT HEART CATH AND CORONARY ANGIOGRAPHY: CATH118249

## 2023-07-17 HISTORY — PX: CORONARY STENT INTERVENTION: CATH118234

## 2023-07-17 LAB — BASIC METABOLIC PANEL
Anion gap: 12 (ref 5–15)
BUN: 15 mg/dL (ref 8–23)
CO2: 22 mmol/L (ref 22–32)
Calcium: 8.7 mg/dL — ABNORMAL LOW (ref 8.9–10.3)
Chloride: 98 mmol/L (ref 98–111)
Creatinine, Ser: 0.82 mg/dL (ref 0.61–1.24)
GFR, Estimated: >60 mL/min (ref >60)
Glucose, Bld: 332 mg/dL — ABNORMAL HIGH (ref 70–99)
Potassium: 3.3 mmol/L — ABNORMAL LOW (ref 3.5–5.1)
Sodium: 132 mmol/L — ABNORMAL LOW (ref 135–145)

## 2023-07-17 LAB — ECHOCARDIOGRAM COMPLETE
AR max vel: 2.15 cm2
AV Area VTI: 2.97 cm2
AV Area mean vel: 2.43 cm2
AV Mean grad: 3.5 mm[Hg]
AV Peak grad: 6.8 mm[Hg]
Ao pk vel: 1.31 m/s
Area-P 1/2: 5.31 cm2
Calc EF: 46.1 %
Height: 65 in
MV VTI: 2.19 cm2
S' Lateral: 4.5 cm
Single Plane A2C EF: 46.3 %
Single Plane A4C EF: 42.9 %
Weight: 2720 [oz_av]

## 2023-07-17 LAB — CBC
HCT: 37.6 % — ABNORMAL LOW (ref 39.0–52.0)
Hemoglobin: 12.5 g/dL — ABNORMAL LOW (ref 13.0–17.0)
MCH: 27.7 pg (ref 26.0–34.0)
MCHC: 33.2 g/dL (ref 30.0–36.0)
MCV: 83.2 fL (ref 80.0–100.0)
Platelets: 244 10*3/uL (ref 150–400)
RBC: 4.52 MIL/uL (ref 4.22–5.81)
RDW: 13.6 % (ref 11.5–15.5)
WBC: 7.5 10*3/uL (ref 4.0–10.5)
nRBC: 0 % (ref 0.0–0.2)

## 2023-07-17 LAB — GLUCOSE, CAPILLARY
Glucose-Capillary: 179 mg/dL — ABNORMAL HIGH (ref 70–99)
Glucose-Capillary: 145 mg/dL — ABNORMAL HIGH (ref 70–99)
Glucose-Capillary: 254 mg/dL — ABNORMAL HIGH (ref 70–99)

## 2023-07-17 LAB — PROTIME-INR
INR: 1.1 (ref 0.8–1.2)
Prothrombin Time: 13.9 s (ref 11.4–15.2)

## 2023-07-17 LAB — TROPONIN I (HIGH SENSITIVITY)
Troponin I (High Sensitivity): 633 ng/L (ref <18)
Troponin I (High Sensitivity): 1867 ng/L (ref <18)
Troponin I (High Sensitivity): 4110 ng/L (ref <18)
Troponin I (High Sensitivity): 2493 ng/L (ref <18)

## 2023-07-17 LAB — HEMOGLOBIN A1C
Hgb A1c MFr Bld: 10.5 % — ABNORMAL HIGH (ref 4.8–5.6)
Mean Plasma Glucose: 254.65 mg/dL

## 2023-07-17 LAB — APTT: aPTT: 26 s (ref 24–36)

## 2023-07-17 LAB — POCT ACTIVATED CLOTTING TIME: Activated Clotting Time: 435 s

## 2023-07-17 LAB — HEPARIN LEVEL (UNFRACTIONATED): Heparin Unfractionated: 0.37 [IU]/mL (ref 0.30–0.70)

## 2023-07-17 SURGERY — LEFT HEART CATH AND CORONARY ANGIOGRAPHY
Anesthesia: Moderate Sedation

## 2023-07-17 MED ORDER — SODIUM CHLORIDE 0.9 % WEIGHT BASED INFUSION
1.0000 mL/kg/h | INTRAVENOUS | Status: AC
Start: 2023-07-17 — End: 2023-07-18
  Administered 2023-07-17: 1 mL/kg/h via INTRAVENOUS

## 2023-07-17 MED ORDER — METOPROLOL TARTRATE 50 MG PO TABS: 50.0000 mg | ORAL_TABLET | Freq: Two times a day (BID) | ORAL | Status: DC

## 2023-07-17 MED ORDER — CLOPIDOGREL BISULFATE 75 MG PO TABS
ORAL_TABLET | ORAL | Status: AC
  Filled 2023-07-17: qty 4

## 2023-07-17 MED ORDER — ASPIRIN 81 MG PO TBEC: 81.0000 mg | DELAYED_RELEASE_TABLET | Freq: Every day | ORAL | Status: DC

## 2023-07-17 MED ORDER — NITROGLYCERIN 0.4 MG SL SUBL: 0.4000 mg | SUBLINGUAL_TABLET | SUBLINGUAL | Status: DC | PRN

## 2023-07-17 MED ORDER — SODIUM CHLORIDE 0.9 % IV SOLN: 250.0000 mL | INTRAVENOUS | Status: DC | PRN

## 2023-07-17 MED ORDER — FLUTICASONE PROPIONATE 50 MCG/ACT NA SUSP: 2.0000 | Freq: Every day | NASAL | Status: DC

## 2023-07-17 MED ORDER — HEPARIN (PORCINE) IN NACL 2000-0.9 UNIT/L-% IV SOLN
INTRAVENOUS | Status: DC | PRN
  Administered 2023-07-17: 1000 mL

## 2023-07-17 MED ORDER — PANTOPRAZOLE SODIUM 20 MG PO TBEC
20.0000 mg | DELAYED_RELEASE_TABLET | Freq: Every day | ORAL | Status: DC
  Administered 2023-07-18: 20 mg via ORAL
  Filled 2023-07-17 (×2): qty 1

## 2023-07-17 MED ORDER — IOHEXOL 300 MG/ML  SOLN
INTRAMUSCULAR | Status: DC | PRN
  Administered 2023-07-17: 162 mL

## 2023-07-17 MED ORDER — ACETAMINOPHEN 325 MG PO TABS
ORAL_TABLET | ORAL | Status: AC
  Filled 2023-07-17: qty 2

## 2023-07-17 MED ORDER — LOSARTAN POTASSIUM 50 MG PO TABS
100.0000 mg | ORAL_TABLET | Freq: Every day | ORAL | Status: DC
  Administered 2023-07-17 – 2023-07-18 (×2): 100 mg via ORAL
  Filled 2023-07-17 (×2): qty 2

## 2023-07-17 MED ORDER — ESCITALOPRAM OXALATE 10 MG PO TABS: 10.0000 mg | ORAL_TABLET | Freq: Every day | ORAL | Status: DC

## 2023-07-17 MED ORDER — ESCITALOPRAM OXALATE 10 MG PO TABS
20.0000 mg | ORAL_TABLET | Freq: Every day | ORAL | Status: DC
  Administered 2023-07-17 – 2023-07-18 (×2): 20 mg via ORAL
  Filled 2023-07-17 (×2): qty 2

## 2023-07-17 MED ORDER — HEPARIN SODIUM (PORCINE) 1000 UNIT/ML IJ SOLN
INTRAMUSCULAR | Status: AC
  Filled 2023-07-17: qty 10

## 2023-07-17 MED ORDER — METOPROLOL SUCCINATE ER 100 MG PO TB24
100.0000 mg | ORAL_TABLET | Freq: Every day | ORAL | Status: DC
  Administered 2023-07-17 – 2023-07-18 (×2): 100 mg via ORAL
  Filled 2023-07-17 (×2): qty 1

## 2023-07-17 MED ORDER — SODIUM CHLORIDE 0.9 % IV SOLN
INTRAVENOUS | Status: AC | PRN
  Administered 2023-07-17: 1.75 mg/kg/h via INTRAVENOUS

## 2023-07-17 MED ORDER — MIDAZOLAM HCL 2 MG/2ML IJ SOLN
INTRAMUSCULAR | Status: DC | PRN
  Administered 2023-07-17: 1 mg via INTRAVENOUS

## 2023-07-17 MED ORDER — SODIUM CHLORIDE 0.9 % WEIGHT BASED INFUSION: 1.0000 mL/kg/h | INTRAVENOUS | Status: DC

## 2023-07-17 MED ORDER — INSULIN ASPART 100 UNIT/ML IJ SOLN
0.0000 [IU] | Freq: Three times a day (TID) | INTRAMUSCULAR | Status: DC
  Administered 2023-07-17: 3 [IU] via SUBCUTANEOUS
  Administered 2023-07-18: 5 [IU] via SUBCUTANEOUS
  Administered 2023-07-18: 11 [IU] via SUBCUTANEOUS
  Filled 2023-07-17 (×3): qty 1

## 2023-07-17 MED ORDER — CLOPIDOGREL BISULFATE 75 MG PO TABS
ORAL_TABLET | ORAL | Status: DC | PRN
  Administered 2023-07-17: 300 mg via ORAL

## 2023-07-17 MED ORDER — ASPIRIN 81 MG PO CHEW
81.0000 mg | CHEWABLE_TABLET | Freq: Every day | ORAL | Status: DC
  Administered 2023-07-18: 81 mg via ORAL
  Filled 2023-07-17: qty 1

## 2023-07-17 MED ORDER — BIVALIRUDIN BOLUS VIA INFUSION - CUPID
INTRAVENOUS | Status: DC | PRN
  Administered 2023-07-17: 57.825 mg via INTRAVENOUS

## 2023-07-17 MED ORDER — LISINOPRIL 5 MG PO TABS: 2.5000 mg | ORAL_TABLET | Freq: Every day | ORAL | Status: DC

## 2023-07-17 MED ORDER — ASPIRIN 81 MG PO CHEW: 81.0000 mg | CHEWABLE_TABLET | ORAL | Status: DC

## 2023-07-17 MED ORDER — FENTANYL CITRATE (PF) 100 MCG/2ML IJ SOLN
INTRAMUSCULAR | Status: DC | PRN
  Administered 2023-07-17: 50 ug via INTRAVENOUS

## 2023-07-17 MED ORDER — ACETAMINOPHEN 325 MG PO TABS
650.0000 mg | ORAL_TABLET | ORAL | Status: DC | PRN
Start: 2023-07-17 — End: 2023-07-18
  Administered 2023-07-17 – 2023-07-18 (×3): 650 mg via ORAL
  Filled 2023-07-17 (×2): qty 2

## 2023-07-17 MED ORDER — HYDROCHLOROTHIAZIDE 12.5 MG PO TABS
12.5000 mg | ORAL_TABLET | Freq: Every day | ORAL | Status: DC
  Administered 2023-07-17 – 2023-07-18 (×2): 12.5 mg via ORAL
  Filled 2023-07-17 (×2): qty 1

## 2023-07-17 MED ORDER — ROSUVASTATIN CALCIUM 10 MG PO TABS
40.0000 mg | ORAL_TABLET | Freq: Every day | ORAL | Status: DC
  Administered 2023-07-18: 40 mg via ORAL
  Filled 2023-07-17: qty 4
  Filled 2023-07-17: qty 2

## 2023-07-17 MED ORDER — FENTANYL CITRATE (PF) 100 MCG/2ML IJ SOLN
INTRAMUSCULAR | Status: AC
  Filled 2023-07-17: qty 2

## 2023-07-17 MED ORDER — DIPHENHYDRAMINE HCL 25 MG PO TABS: 25.0000 mg | ORAL_TABLET | Freq: Four times a day (QID) | ORAL | Status: DC | PRN

## 2023-07-17 MED ORDER — ONDANSETRON HCL 4 MG/2ML IJ SOLN
4.0000 mg | Freq: Four times a day (QID) | INTRAMUSCULAR | Status: DC | PRN
  Administered 2023-07-18: 4 mg via INTRAVENOUS
  Filled 2023-07-17: qty 2

## 2023-07-17 MED ORDER — BIVALIRUDIN TRIFLUOROACETATE 250 MG IV SOLR
INTRAVENOUS | Status: AC
  Filled 2023-07-17: qty 250

## 2023-07-17 MED ORDER — SODIUM CHLORIDE 0.9 % WEIGHT BASED INFUSION
3.0000 mL/kg/h | INTRAVENOUS | Status: DC
  Administered 2023-07-17: 3 mL/kg/h via INTRAVENOUS

## 2023-07-17 MED ORDER — VERAPAMIL HCL 2.5 MG/ML IV SOLN
INTRAVENOUS | Status: AC
  Filled 2023-07-17: qty 2

## 2023-07-17 MED ORDER — MIDAZOLAM HCL 2 MG/2ML IJ SOLN
INTRAMUSCULAR | Status: AC
  Filled 2023-07-17: qty 2

## 2023-07-17 MED ORDER — ASPIRIN 81 MG PO CHEW
CHEWABLE_TABLET | ORAL | Status: AC
  Filled 2023-07-17: qty 2

## 2023-07-17 MED ORDER — HEPARIN BOLUS VIA INFUSION
4000.0000 [IU] | Freq: Once | INTRAVENOUS | Status: AC
  Administered 2023-07-17: 4000 [IU] via INTRAVENOUS
  Filled 2023-07-17: qty 4000

## 2023-07-17 MED ORDER — AMLODIPINE BESYLATE 5 MG PO TABS: 10.0000 mg | ORAL_TABLET | Freq: Every day | ORAL | Status: DC

## 2023-07-17 MED ORDER — HEPARIN (PORCINE) 25000 UT/250ML-% IV SOLN
1000.0000 [IU]/h | INTRAVENOUS | Status: DC
  Administered 2023-07-17: 1000 [IU]/h via INTRAVENOUS
  Filled 2023-07-17: qty 250

## 2023-07-17 MED ORDER — LORATADINE 10 MG PO TABS
10.0000 mg | ORAL_TABLET | Freq: Every day | ORAL | Status: DC
  Administered 2023-07-18: 10 mg via ORAL
  Filled 2023-07-17: qty 1

## 2023-07-17 MED ORDER — ASPIRIN 81 MG PO CHEW
162.0000 mg | CHEWABLE_TABLET | Freq: Once | ORAL | Status: AC
Start: 2023-07-17 — End: 2023-07-17
  Administered 2023-07-17: 162 mg via ORAL
  Filled 2023-07-17: qty 2

## 2023-07-17 MED ORDER — CLOPIDOGREL BISULFATE 75 MG PO TABS
75.0000 mg | ORAL_TABLET | Freq: Every day | ORAL | Status: DC
  Administered 2023-07-18: 75 mg via ORAL
  Filled 2023-07-17: qty 1

## 2023-07-17 MED ORDER — HEPARIN (PORCINE) IN NACL 1000-0.9 UT/500ML-% IV SOLN
INTRAVENOUS | Status: AC
  Filled 2023-07-17: qty 1000

## 2023-07-17 MED ORDER — POTASSIUM CHLORIDE CRYS ER 20 MEQ PO TBCR: 40.0000 meq | EXTENDED_RELEASE_TABLET | Freq: Once | ORAL | Status: DC

## 2023-07-17 MED ORDER — ASPIRIN 81 MG PO CHEW
CHEWABLE_TABLET | ORAL | Status: DC | PRN
  Administered 2023-07-17: 162 mg via ORAL

## 2023-07-17 MED ORDER — GEMFIBROZIL 600 MG PO TABS: 600.0000 mg | ORAL_TABLET | Freq: Two times a day (BID) | ORAL | Status: DC

## 2023-07-17 MED ORDER — SODIUM CHLORIDE 0.9% FLUSH
3.0000 mL | Freq: Two times a day (BID) | INTRAVENOUS | Status: DC
  Administered 2023-07-17 – 2023-07-18 (×2): 3 mL via INTRAVENOUS

## 2023-07-17 MED ORDER — SODIUM CHLORIDE 0.9% FLUSH: 3.0000 mL | INTRAVENOUS | Status: DC | PRN

## 2023-07-17 SURGICAL SUPPLY — 18 items
BALLN TREK RX 2.5X12 (BALLOONS) ×1
BALLOON TREK RX 2.5X12 (BALLOONS) IMPLANT
CATH INFINITI 5FR MULTPACK ANG (CATHETERS) IMPLANT
CATH VISTA GUIDE 6FR LCB (CATHETERS) IMPLANT
DEVICE CLOSURE MYNXGRIP 6/7F (Vascular Products) IMPLANT
KIT ENCORE 26 ADVANTAGE (KITS) IMPLANT
NDL PERC 18GX7CM (NEEDLE) IMPLANT
NEEDLE PERC 18GX7CM (NEEDLE) ×1 IMPLANT
PACK CARDIAC CATH (CUSTOM PROCEDURE TRAY) ×2 IMPLANT
PROTECTION STATION PRESSURIZED (MISCELLANEOUS) ×1
SET ATX-X65L (MISCELLANEOUS) IMPLANT
SHEATH AVANTI 5FR X 11CM (SHEATH) IMPLANT
SHEATH AVANTI 6FR X 11CM (SHEATH) IMPLANT
STATION PROTECTION PRESSURIZED (MISCELLANEOUS) IMPLANT
STENT ONYX FRONTIER 3.0X15 (Permanent Stent) IMPLANT
TUBING CIL FLEX 10 FLL-RA (TUBING) IMPLANT
WIRE G HI TQ BMW 190 (WIRE) IMPLANT
WIRE GUIDERIGHT .035X150 (WIRE) IMPLANT

## 2023-07-17 NOTE — ED Provider Notes (Signed)
[facility] Provider Note    Event Date/Time   First MD Initiated Contact with Patient 07/17/23 [phone]     (approximate)   History   Shortness of Breath   HPI  Tyler Russell is a 67 year old male with history of CAD s/p CABG in 2017, hypertension, T2DM presenting to the emergency department for evaluation of shortness of breath.  Patient reports that earlier today he had a several hour episode where he felt some heartburn type pain with shortness of breath and heaviness in his arms.  Wife noticed that he was belching a lot through the day which was how he presented previously before his CABG.  Currently reports some pain in his right shoulder that he thinks could be related to arthritis, denies chest pain.  No fevers or chills.  No recent trauma.    Physical Exam   Triage Vital Signs: ED Triage Vitals  Encounter Vitals Group     BP 07/17/23 0217 (!) 195/93     Systolic BP Percentile --      Diastolic BP Percentile --      Pulse Rate 07/17/23 0217 79     Resp 07/17/23 0217 18     Temp 07/17/23 0217 98 F (36.7 C)     Temp Source 07/17/23 0217 Oral     SpO2 07/17/23 0217 97 %     Weight 07/17/23 0205 170 lb (77.1 kg)     Height 07/17/23 0205 5\' 5"  (1.651 m)     Head Circumference --      Peak Flow --      Pain Score 07/17/23 0205 7     Pain Loc --      Pain Education --      Exclude from Growth Chart --     Most recent vital signs: Vitals:   07/17/23 0500 07/17/23 0515  BP: (!) 162/89   Pulse: 79 76  Resp: 18 12  Temp:    SpO2:  100%     General: Awake, interactive  CV:  Regular rate, good peripheral perfusion.  Resp:  Lung clear to auscultation, respirations unlabored Abd:  Soft, nondistended, no significant tenderness to palpation Neuro:  Symmetric facial movement, fluid speech   ED Results / Procedures / Treatments   Labs (all labs ordered are listed, but only abnormal results are displayed) Labs Reviewed  CBC - Abnormal;  Notable for the following components:      Result Value   Hemoglobin 12.5 (*)    HCT 37.6 (*)    All other components within normal limits  BASIC METABOLIC PANEL - Abnormal; Notable for the following components:   Sodium 132 (*)    Potassium 3.3 (*)    Glucose, Bld 332 (*)    Calcium 8.7 (*)    All other components within normal limits  TROPONIN I (HIGH SENSITIVITY) - Abnormal; Notable for the following components:   Troponin I (High Sensitivity) 633 (*)    All other components within normal limits  TROPONIN I (HIGH SENSITIVITY) - Abnormal; Notable for the following components:   Troponin I (High Sensitivity) 1,867 (*)    All other components within normal limits  APTT  PROTIME-INR  HEPARIN LEVEL (UNFRACTIONATED)     EKG EKG independently reviewed interpreted by myself (ER attending) demonstrates:  Initial EKG from triage did not meet STEMI criteria, but did demonstrate subtle elevations in the anterior leads with ST depressions in the inferior and high lateral leads concerning for acute  ischemia.  Sinus rhythm at a rate of 80, PR 148, QRS 110, QTc 477. Repeat EKG obtained at 413.  This demonstrated sinus rhythm at a rate of 77, PR 144, QRS 110, QTc 463, remains with subtle depressions, but overall improved from prior.  RADIOLOGY Imaging independently reviewed and interpreted by myself demonstrates:  Chest x-Lautaro Koral without focal consolidation, radiology notes possible lung nodule recommend CT  PROCEDURES:  Critical Care performed: Yes, see critical care procedure note(s)  CRITICAL CARE Performed by: Trinna Post   Total critical care time: 34 minutes  Critical care time was exclusive of separately billable procedures and treating other patients.  Critical care was necessary to treat or prevent imminent or life-threatening deterioration.  Critical care was time spent personally by me on the following activities: development of treatment plan with patient and/or surrogate as well  as nursing, discussions with consultants, evaluation of patient's response to treatment, examination of patient, obtaining history from patient or surrogate, ordering and performing treatments and interventions, ordering and review of laboratory studies, ordering and review of radiographic studies, pulse oximetry and re-evaluation of patient's condition.   Procedures   MEDICATIONS ORDERED IN ED: Medications  heparin ADULT infusion 100 units/mL (25000 units/277mL) (1,000 Units/hr Intravenous New Bag/Given 07/17/23 0535)  aspirin chewable tablet 162 mg (162 mg Oral Given 07/17/23 0450)  heparin bolus via infusion 4,000 Units (4,000 Units Intravenous Bolus from Bag 07/17/23 0533)     IMPRESSION / MDM / ASSESSMENT AND PLAN / ED COURSE  I reviewed the triage vital signs and the nursing notes.  Differential diagnosis includes, but is not limited to, ACS, pneumonia, pneumothorax, musculoskeletal pain, GERD  Patient's presentation is most consistent with acute presentation with potential threat to life or bodily function.  67 year old male presenting with shortness of breath and arm heaviness, improved prior to presentation.  Vital stable on presentation.  Concerning EKG on presentation.Initial troponin elevated at 633.  Repeat EKG actually looks somewhat improved.  Ordered for 2 additional aspirin (took to her at home) and heparin drip.  Repeat troponin uptrending to 1867.  Will reach out to hospitalist team for management of NSTEMI.  Reviewed with Dr. Arville Care.  He will evaluate the patient for anticipated admission.      FINAL CLINICAL IMPRESSION(S) / ED DIAGNOSES   Final diagnoses:  NSTEMI (non-ST elevated myocardial infarction) (HCC)     Rx / DC Orders   ED Discharge Orders     None        Note:  This document was prepared using Dragon voice recognition software and may include unintentional dictation errors.   Trinna Post, MD 07/17/23 [phone]

## 2023-07-17 NOTE — ED Notes (Signed)
 Lab reports troponin results; acuity level changed

## 2023-07-17 NOTE — Progress Notes (Signed)
ANTICOAGULATION CONSULT NOTE  Pharmacy Consult for heparin infusion Indication: ACS/STEMI  Allergies  Allergen Reactions   Atorvastatin     Other reaction(s): Muscle Pain   Vicodin [Hydrocodone-Acetaminophen] Itching   Lactalbumin     Other reaction(s): Other (See Comments) Sneezing and post nasal drip   Milk-Related Compounds Other (See Comments)    Sneezing and post nasal drip    Patient Measurements: Height: 5\' 5"  (165.1 cm) Weight: 77.1 kg (170 lb) IBW/kg (Calculated) : 61.5 Heparin Dosing Weight: 76.9 kg  Vital Signs: Temp: 97.8 F (36.6 C) (11/01 1143) Temp Source: Oral (11/01 0754) BP: 154/80 (11/01 1143) Pulse Rate: 75 (11/01 1143)  Labs: Recent Labs    07/17/23 0245 07/17/23 0416 07/17/23 0450 07/17/23 1153  HGB 12.5*  --   --   --   HCT 37.6*  --   --   --   PLT 244  --   --   --   APTT  --   --  26  --   LABPROT  --   --  13.9  --   INR  --   --  1.1  --   HEPARINUNFRC  --   --   --  0.37  CREATININE 0.82  --   --   --   TROPONINIHS 633* 1,867*  --   --     Estimated Creatinine Clearance: 83.7 mL/min (by C-G formula based on SCr of 0.82 mg/dL).   Medical History: Past Medical History:  Diagnosis Date   Alcohol abuse    Arthritis    Chronic back pain    Chronic kidney disease    Complication of anesthesia    had difficulty with breathing, had difficulty waking up   Coronary artery disease    Diabetes mellitus without complication (HCC)    type 2   GERD (gastroesophageal reflux disease)    Gonorrhea    Headache(784.0)    History of kidney stones    Hypercholesteremia    no medication at this time   Hypertension    Trigger finger    on both hands   Ulcer    on medication    Assessment: Pt is a 67 yo male presenting to ED c/o SOB and pain in both arms, found with elevated Troponin I level  Goal of Therapy:  Heparin level 0.3-0.7 units/ml Monitor platelets by anticoagulation protocol: Yes  11/1@1153 : HL 0.37, therapeutic x1    Plan:  Continue heparin infusion at 1000 units/hr Will check confirmatory HL in 6 hrs CBC daily while on heparin  Bettey Costa, PharmD Clinical Pharmacist 07/17/2023 1:24 PM

## 2023-07-17 NOTE — H&P (Signed)
History and Physical    MERLIN GOLDEN MWU:[id number] DOB: Apr 23, 1956 DOA: 07/17/2023  PCP: [facility] (Confirm with patient/family/NH records and if not entered, this has to be entered at [facility] point of entry) Patient coming from: Home  I have personally briefly reviewed patient's old medical records in [facility]  Chief Complaint: Feeling better  HPI: CAMDON SAETERN is a 67 y.o. male with medical history significant of CAD status post CABG, HTN, HLD, IIDM, anxiety/depression, presented with epigastric pain.  Patient started to have epigastric pain after eating a large dinner in a restaurant, feeling nauseous and vomited stomach content x 1 nonbloody none bile.  Then patient started to feel shortness of breath and decided to come to ED.  Symptoms eased sometime last night.  He also complaining about worsening of right shoulder pain which he attributed to poorly controlled arthritis.  Follow-up with [facility] cardiology but no recent stress test.  ED Course: Afebrile, no tachycardia, blood pressure significant elevated 195/93, O2 saturation 97% on room air.  EKG showed no acute ST changes but chronic ST-T segment depressions on lead II, III and aVF, as compared to before.  Troponin uptrending 633> 1867.  And patient was started on heparin drip.  Review of Systems: As per HPI otherwise 14 point review of systems negative.    Past Medical History:  Diagnosis Date   Alcohol abuse    Arthritis    Chronic back pain    Chronic kidney disease    Complication of anesthesia    had difficulty with breathing, had difficulty waking up   Coronary artery disease    Diabetes mellitus without complication (HCC)    type 2   GERD (gastroesophageal reflux disease)    Gonorrhea    Headache(784.0)    History of kidney stones    Hypercholesteremia    no medication at this time   Hypertension    Trigger finger    on both hands   Ulcer    on medication    Past Surgical History:   Procedure Laterality Date   ANTERIOR LAT LUMBAR FUSION  08/28/2011   Procedure: ANTERIOR LATERAL LUMBAR FUSION 1 LEVEL;  Surgeon: Alvy Beal;  Location: [facility] OR;  Service: Orthopedics;  Laterality: N/A;  Lateral L3-4 Fusion Posterior Spinal Fusion L3-4, Possible Removal of Hardware    CARDIAC CATHETERIZATION N/A 02/27/2016   Procedure: Left Heart Cath and Coronary Angiography;  Surgeon: Lamar Blinks, MD;  Location: [facility] INVASIVE CV LAB;  Service: Cardiovascular;  Laterality: N/A;   CARPAL TUNNEL RELEASE Left 05/04/2019   CARPAL TUNNEL RELEASE Right 02/29/2020   CERVICAL DISC SURGERY     3 ruptured disc, 2012   COLONOSCOPY  2017   CORONARY ARTERY BYPASS GRAFT N/A 02/29/2016   Procedure: CORONARY ARTERY BYPASS GRAFTING (CABG), ON PUMP, TIMES THREE, USING LEFT INTERNAL MAMMARY ARTERY, RIGHT GREATER SAPHENOUS VEIN HARVESTED ENDOSCOPICALLY;  Surgeon: Alleen Borne, MD;  Location: [facility] OR;  Service: Open Heart Surgery;  Laterality: N/A;  LIMA to LAD, SVG to RAMUS INTERMEDIATE, SVG to OM   ENDARTERECTOMY Left 01/28/2022   Procedure: ENDARTERECTOMY CAROTID;  Surgeon: Annice Needy, MD;  Location: [facility] ORS;  Service: Vascular;  Laterality: Left;   ESOPHAGOGASTRODUODENOSCOPY (EGD) WITH PROPOFOL N/A 11/23/2015   Procedure: ESOPHAGOGASTRODUODENOSCOPY (EGD) WITH PROPOFOL;  Surgeon: Christena Deem, MD;  Location: [facility] ENDOSCOPY;  Service: Endoscopy;  Laterality: N/A;   KNEE ARTHROSCOPY Left    LUMBAR FUSION  2008   SHOULDER ARTHROSCOPY  WITH SUBACROMIAL DECOMPRESSION, ROTATOR CUFF REPAIR AND BICEP TENDON REPAIR Left 05/31/2021   Procedure: Left shoulder arthroscopic subscapularis repair, mini-open rotator cuff repair, subacromial decompression, and biceps tenodesis;  Surgeon: Signa Kell, MD;  [facility];  Service: Orthopedics;  Laterality: Left;   TEE WITHOUT CARDIOVERSION N/A 02/29/2016   Procedure: TRANSESOPHAGEAL ECHOCARDIOGRAM (TEE);  Surgeon: Alleen Borne, MD;  Location: [facility]  OR;  Service: Open Heart Surgery;  Laterality: N/A;   VASECTOMY  09/15/1981     reports that he has quit smoking. His smoking use included cigarettes. He has never used smokeless tobacco. He reports current alcohol use. He reports that he does not use drugs.  Allergies  Allergen Reactions   Atorvastatin     Other reaction(s): Muscle Pain   Vicodin [Hydrocodone-Acetaminophen] Itching   Lactalbumin     Other reaction(s): Other (See Comments) Sneezing and post nasal drip   Milk-Related Compounds Other (See Comments)    Sneezing and post nasal drip    Family History  Problem Relation Age of Onset   CAD Father    Stomach cancer Maternal Grandmother      Prior to Admission medications   Medication Sig Start Date End Date Taking? Authorizing Provider  amLODipine (NORVASC) 10 MG tablet Take 10 mg by mouth daily.    [provider]  aspirin 81 MG EC tablet Take 81 mg by mouth daily.    [provider]  blood glucose meter kit and supplies Dispense based on patient and insurance preference. Use up to four times daily as directed. (FOR ICD-9 250.00, 250.01). 03/04/16   Barrett, Erin R, PA-C  cetirizine (ZYRTEC) 10 MG tablet Take 10 mg by mouth daily.    [provider]  diphenhydrAMINE (BENADRYL) 25 MG tablet Take 25 mg by mouth every 6 (six) hours as needed.    [provider]  escitalopram (LEXAPRO) 10 MG tablet Take 1 tablet (10 mg total) by mouth daily. 12/03/17   Carlean Jews, NP  fluticasone (FLONASE) 50 MCG/ACT nasal spray Place 2 sprays into the nose daily as needed. 02/28/21 02/28/22  [provider]  gemfibrozil (LOPID) 600 MG tablet Take 600 mg by mouth 2 (two) times daily before a meal.    [provider]  lisinopril (ZESTRIL) 2.5 MG tablet Take 1 tablet (2.5 mg total) by mouth daily. 03/04/16   Barrett, Erin R, PA-C  metFORMIN (GLUCOPHAGE) 850 MG tablet Take 1 tablet (850 mg total) by mouth 2 (two) times daily with a meal.  03/04/16   Barrett, Erin R, PA-C  metoprolol (LOPRESSOR) 50 MG tablet Take 1 tablet (50 mg total) by mouth 2 (two) times daily. 03/04/16   Barrett, Erin R, PA-C  pantoprazole (PROTONIX) 20 MG tablet Take 20 mg by mouth daily.    [provider]  rosuvastatin (CRESTOR) 40 MG tablet Take 1 tablet (40 mg total) by mouth daily. 01/30/22 03/01/22  Charise Killian, MD    Physical Exam: Vitals:   07/17/23 0730 07/17/23 0745 07/17/23 0747 07/17/23 0754  BP: (!) 157/93     Pulse: 76 79    Resp: 15 16    Temp:    97.9 F (36.6 C)  TempSrc:    Oral  SpO2: 93% 100% 100%   Weight:      Height:        Constitutional: NAD, calm, comfortable Vitals:   07/17/23 0730 07/17/23 0745 07/17/23 0747 07/17/23 0754  BP: (!) 157/93     Pulse:  76 79    Resp: 15 16    Temp:    97.9 F (36.6 C)  TempSrc:    Oral  SpO2: 93% 100% 100%   Weight:      Height:       Eyes: PERRL, lids and conjunctivae normal ENMT: Mucous membranes are moist. Posterior pharynx clear of any exudate or lesions.Normal dentition.  Neck: normal, supple, no masses, no thyromegaly Respiratory: clear to auscultation bilaterally, no wheezing, no crackles. Normal respiratory effort. No accessory muscle use.  Cardiovascular: Regular rate and rhythm, no murmurs / rubs / gallops. No extremity edema. 2+ pedal pulses. No carotid bruits.  Abdomen: no tenderness, no masses palpated. No hepatosplenomegaly. Bowel sounds positive.  Musculoskeletal: no clubbing / cyanosis. No joint deformity upper and lower extremities. Good ROM, no contractures. Normal muscle tone.  Skin: no rashes, lesions, ulcers. No induration Neurologic: CN 2-12 grossly intact. Sensation intact, DTR normal. Strength 5/5 in all 4.  Psychiatric: Normal judgment and insight. Alert and oriented x 3. Normal mood.     Labs on Admission: I have personally reviewed following labs and imaging studies  CBC: Recent Labs  Lab 07/17/23 0245  WBC 7.5  HGB 12.5*  HCT  37.6*  MCV 83.2  PLT 244   Basic Metabolic Panel: Recent Labs  Lab 07/17/23 0245  NA 132*  K 3.3*  CL 98  CO2 22  GLUCOSE 332*  BUN 15  CREATININE 0.82  CALCIUM 8.7*   GFR: Estimated Creatinine Clearance: 83.7 mL/min (by C-G formula based on SCr of 0.82 mg/dL). Liver Function Tests: No results for input(s): "AST", "ALT", "ALKPHOS", "BILITOT", "PROT", "ALBUMIN" in the last 168 hours. No results for input(s): "LIPASE", "AMYLASE" in the last 168 hours. No results for input(s): "AMMONIA" in the last 168 hours. Coagulation Profile: Recent Labs  Lab 07/17/23 0450  INR 1.1   Cardiac Enzymes: No results for input(s): "CKTOTAL", "CKMB", "CKMBINDEX", "TROPONINI" in the last 168 hours. BNP (last 3 results) No results for input(s): "PROBNP" in the last 8760 hours. HbA1C: No results for input(s): "HGBA1C" in the last 72 hours. CBG: No results for input(s): "GLUCAP" in the last 168 hours. Lipid Profile: No results for input(s): "CHOL", "HDL", "LDLCALC", "TRIG", "CHOLHDL", "LDLDIRECT" in the last 72 hours. Thyroid Function Tests: No results for input(s): "TSH", "T4TOTAL", "FREET4", "T3FREE", "THYROIDAB" in the last 72 hours. Anemia Panel: No results for input(s): "VITAMINB12", "FOLATE", "FERRITIN", "TIBC", "IRON", "RETICCTPCT" in the last 72 hours. Urine analysis:    Component Value Date/Time   COLORURINE YELLOW (A) 01/26/2022 0834   APPEARANCEUR CLEAR (A) 01/26/2022 0834   APPEARANCEUR Clear 12/03/2017 1445   LABSPEC 1.018 01/26/2022 0834   PHURINE 7.0 01/26/2022 0834   GLUCOSEU NEGATIVE 01/26/2022 0834   HGBUR NEGATIVE 01/26/2022 0834   BILIRUBINUR NEGATIVE 01/26/2022 0834   BILIRUBINUR Negative 12/03/2017 1445   KETONESUR NEGATIVE 01/26/2022 0834   PROTEINUR NEGATIVE 01/26/2022 0834   UROBILINOGEN 2.0 (H) 09/03/2011 1358   NITRITE NEGATIVE 01/26/2022 0834   LEUKOCYTESUR NEGATIVE 01/26/2022 0834    Radiological Exams on Admission: DG Chest 2 View  Result Date:  07/17/2023 CLINICAL DATA:  Shortness of breath EXAM: CHEST - 2 VIEW COMPARISON:  04/02/2016 FINDINGS: Sternotomy. Hardware in the cervical spine. No acute airspace disease or pleural effusion. Possible nodule at the right lung base. IMPRESSION: No active cardiopulmonary disease. Possible nodule at the right lung base. Recommend further evaluation with chest CT. Electronically Signed   By: Jasmine Pang M.D.   On: 07/17/2023  03:57    EKG: Independently reviewed.  Sinus rhythm, chronic ST depressions on lead II, III, aVF  Assessment/Plan Principal Problem:   Non-STEMI (non-ST elevated myocardial infarction) [facility]) Active Problems:   NSTEMI (non-ST elevated myocardial infarction) (HCC)  (please populate well all problems here in Problem List. (For example, if patient is on BP meds at home and you resume or decide to hold them, it is a problem that needs to be her. Same for CAD, COPD, HLD and so on)  NSTEMI -Continue ACS medications including heparin drip, aspirin, ACE inhibitor, beta-blocker and statin -Case was discussed with on-call [facility] cardiology, who will see patient this morning.  Will keep patient n.p.o. for now. -Echocardiogram  HTN, uncontrolled -Resume home BP meds including amlodipine, lisinopril and metoprolol  IIDM with hyperglycemia -Hold off metformin for possible incoming cardiology intervention -SSI for now  Hypokalemia -P.o. replacement  Hyponatremia -Mild, euvolemic, probably secondary to NSTEMI, repeat sodium level tomorrow  Mixed hyperlipidemia -Continue gemfibrozil -Continue Crestor  DVT prophylaxis: Heparin drip Code Status: Full code Family Communication: None at bedside Disposition Plan: Patient sick with NSTEMI requiring inpatient cardiology workup, expect more than 2 midnight hospital stay Consults called: [facility] cardiology Admission status: PCU   Emeline General MD [facility] Pager [phone]  07/17/2023, 9:21 AM

## 2023-07-17 NOTE — Progress Notes (Signed)
ANTICOAGULATION CONSULT NOTE  Pharmacy Consult for heparin infusion Indication: ACS/STEMI  Allergies  Allergen Reactions   Atorvastatin     Other reaction(s): Muscle Pain   Vicodin [Hydrocodone-Acetaminophen] Itching   Lactalbumin     Other reaction(s): Other (See Comments) Sneezing and post nasal drip   Milk-Related Compounds Other (See Comments)    Sneezing and post nasal drip    Patient Measurements: Height: 5\' 5"  (165.1 cm) Weight: 77.1 kg (170 lb) IBW/kg (Calculated) : 61.5 Heparin Dosing Weight: 76.9 kg  Vital Signs: Temp: 98 F (36.7 C) (11/01 0217) Temp Source: Oral (11/01 0217) BP: 155/80 (11/01 0405) Pulse Rate: 77 (11/01 0405)  Labs: Recent Labs    07/17/23 0245  HGB 12.5*  HCT 37.6*  PLT 244  CREATININE 0.82  TROPONINIHS 633*    Estimated Creatinine Clearance: 83.7 mL/min (by C-G formula based on SCr of 0.82 mg/dL).   Medical History: Past Medical History:  Diagnosis Date   Alcohol abuse    Arthritis    Chronic back pain    Chronic kidney disease    Complication of anesthesia    had difficulty with breathing, had difficulty waking up   Coronary artery disease    Diabetes mellitus without complication (HCC)    type 2   GERD (gastroesophageal reflux disease)    Gonorrhea    Headache(784.0)    History of kidney stones    Hypercholesteremia    no medication at this time   Hypertension    Trigger finger    on both hands   Ulcer    on medication    Assessment: Pt is a 67 yo male presenting to ED c/o SOB and pain in both arms, found with elevated Troponin I level  Goal of Therapy:  Heparin level 0.3-0.7 units/ml Monitor platelets by anticoagulation protocol: Yes   Plan:  Bolus 4000 units x 1 Start heparin infusion at 1000 units/hr Will check HL in 6 hr after start of infusion CBC daily while on heparin  Otelia Sergeant, PharmD, [facility] 07/17/2023 4:24 AM

## 2023-07-17 NOTE — Consult Note (Signed)
[facility] Cardiology  CARDIOLOGY CONSULT NOTE  Patient ID: Tyler Russell MRN: [id number] DOB/AGE: December 23, 1955 67 y.o.  Admit date: 07/17/2023 Referring Physician Dr. Chipper Herb Primary Cardiologist: [facility] Reason for Consultation NSTEMI  HPI: 67 year old male with history of CAD status post three-vessel CABG in 2017, hypertension, hyperlipidemia, diabetes who presented to hospital with bilateral arm/chest discomfort and our service is consulted for NSTEMI.  After eating dinner yesterday he felt bilateral upper extremity heaviness, chest discomfort which she felt like acid reflux and shortness of breath along with nausea/vomiting.  Came to ED where he was noted to have ST depression in inferior lateral leads.  Troponin elevated 600--1000.  Later symptoms improved along with improvement of EKG changes.  Started on NSTEMI management.  By the time I saw patient, he was asymptomatic and resting comfortably in bed.  Review of systems complete and found to be negative unless listed above     Past Medical History:  Diagnosis Date   Alcohol abuse    Arthritis    Chronic back pain    Chronic kidney disease    Complication of anesthesia    had difficulty with breathing, had difficulty waking up   Coronary artery disease    Diabetes mellitus without complication (HCC)    type 2   GERD (gastroesophageal reflux disease)    Gonorrhea    Headache(784.0)    History of kidney stones    Hypercholesteremia    no medication at this time   Hypertension    Trigger finger    on both hands   Ulcer    on medication    Past Surgical History:  Procedure Laterality Date   ANTERIOR LAT LUMBAR FUSION  08/28/2011   Procedure: ANTERIOR LATERAL LUMBAR FUSION 1 LEVEL;  Surgeon: Alvy Beal;  Location: [facility] OR;  Service: Orthopedics;  Laterality: N/A;  Lateral L3-4 Fusion Posterior Spinal Fusion L3-4, Possible Removal of Hardware    CARDIAC CATHETERIZATION N/A 02/27/2016   Procedure: Left Heart Cath and  Coronary Angiography;  Surgeon: Lamar Blinks, MD;  Location: [facility] INVASIVE CV LAB;  Service: Cardiovascular;  Laterality: N/A;   CARPAL TUNNEL RELEASE Left 05/04/2019   CARPAL TUNNEL RELEASE Right 02/29/2020   CERVICAL DISC SURGERY     3 ruptured disc, 2012   COLONOSCOPY  2017   CORONARY ARTERY BYPASS GRAFT N/A 02/29/2016   Procedure: CORONARY ARTERY BYPASS GRAFTING (CABG), ON PUMP, TIMES THREE, USING LEFT INTERNAL MAMMARY ARTERY, RIGHT GREATER SAPHENOUS VEIN HARVESTED ENDOSCOPICALLY;  Surgeon: Alleen Borne, MD;  Location: [facility] OR;  Service: Open Heart Surgery;  Laterality: N/A;  LIMA to LAD, SVG to RAMUS INTERMEDIATE, SVG to OM   ENDARTERECTOMY Left 01/28/2022   Procedure: ENDARTERECTOMY CAROTID;  Surgeon: Annice Needy, MD;  Location: [facility] ORS;  Service: Vascular;  Laterality: Left;   ESOPHAGOGASTRODUODENOSCOPY (EGD) WITH PROPOFOL N/A 11/23/2015   Procedure: ESOPHAGOGASTRODUODENOSCOPY (EGD) WITH PROPOFOL;  Surgeon: Christena Deem, MD;  Location: [facility] ENDOSCOPY;  Service: Endoscopy;  Laterality: N/A;   KNEE ARTHROSCOPY Left    LUMBAR FUSION  2008   SHOULDER ARTHROSCOPY WITH SUBACROMIAL DECOMPRESSION, ROTATOR CUFF REPAIR AND BICEP TENDON REPAIR Left 05/31/2021   Procedure: Left shoulder arthroscopic subscapularis repair, mini-open rotator cuff repair, subacromial decompression, and biceps tenodesis;  Surgeon: Signa Kell, MD;  Location: [facility];  Service: Orthopedics;  Laterality: Left;   TEE WITHOUT CARDIOVERSION N/A 02/29/2016   Procedure: TRANSESOPHAGEAL ECHOCARDIOGRAM (TEE);  Surgeon: Alleen Borne, MD;  Location: [facility] OR;  Service: Open  Heart Surgery;  Laterality: N/A;   VASECTOMY  09/15/1981    Medications Prior to Admission  Medication Sig Dispense Refill Last Dose   albuterol (VENTOLIN HFA) 108 (90 Base) MCG/ACT inhaler Inhale 2 puffs into the lungs every 6 (six) hours as needed for wheezing or shortness of breath.   prn at unknown   aspirin 81 MG EC tablet Take 81 mg  by mouth daily.   07/16/2023   cetirizine (ZYRTEC) 10 MG tablet Take 10 mg by mouth daily.   07/16/2023   clopidogrel (PLAVIX) 75 MG tablet Take 75 mg by mouth daily.   07/16/2023   diphenhydrAMINE (BENADRYL) 25 MG tablet Take 25 mg by mouth every 6 (six) hours as needed.   prn at unknown   escitalopram (LEXAPRO) 20 MG tablet Take 20 mg by mouth daily.   07/16/2023   losartan-hydrochlorothiazide (HYZAAR) 100-12.5 MG tablet Take 1 tablet by mouth daily.   07/16/2023   metFORMIN (GLUCOPHAGE-XR) 500 MG 24 hr tablet Take 500 mg by mouth 2 (two) times daily.   07/16/2023   metoprolol succinate (TOPROL-XL) 100 MG 24 hr tablet Take 100 mg by mouth daily.   07/16/2023   pantoprazole (PROTONIX) 40 MG tablet Take 40 mg by mouth daily.   07/16/2023   rosuvastatin (CRESTOR) 40 MG tablet Take 1 tablet (40 mg total) by mouth daily. 30 tablet 0 07/16/2023   blood glucose meter kit and supplies Dispense based on patient and insurance preference. Use up to four times daily as directed. (FOR ICD-9 250.00, 250.01). 1 each 0    gemfibrozil (LOPID) 600 MG tablet Take 600 mg by mouth 2 (two) times daily before a meal. (Patient not taking: Reported on 07/17/2023)   Not Taking   Social History   Socioeconomic History   Marital status: Married    Spouse name: Not on file   Number of children: Not on file   Years of education: Not on file   Highest education level: Not on file  Occupational History   Not on file  Tobacco Use   Smoking status: Former    Types: Cigarettes   Smokeless tobacco: Never   Tobacco comments:    stopped at age 31 years  Vaping Use   Vaping status: Never Used  Substance and Sexual Activity   Alcohol use: Yes    Comment: occassional   Drug use: No   Sexual activity: Yes  Other Topics Concern   Not on file  Social History Narrative   Independent at baseline, lives with family   Social Determinants of Health   Financial Resource Strain: Not on file  Food Insecurity: No Food  Insecurity (07/17/2023)   Hunger Vital Sign    Worried About Running Out of Food in the Last Year: Never true    Ran Out of Food in the Last Year: Never true  Transportation Needs: No Transportation Needs (07/17/2023)   PRAPARE - Administrator, Civil Service (Medical): No    Lack of Transportation (Non-Medical): No  Physical Activity: Not on file  Stress: Not on file  Social Connections: Not on file  Intimate Partner Violence: Not At Risk (07/17/2023)   Humiliation, Afraid, Rape, and Kick questionnaire    Fear of Current or Ex-Partner: No    Emotionally Abused: No    Physically Abused: No    Sexually Abused: No    Family History  Problem Relation Age of Onset   CAD Father    Stomach cancer Maternal Grandmother  Review of systems complete and found to be negative unless listed above      PHYSICAL EXAM  General: Well developed, well nourished, in no acute distress HEENT:  Normocephalic and atramatic Neck:  No JVD.  Lungs: Clear bilaterally to auscultation and percussion. Heart: HRRR . Normal S1 and S2 without gallops or murmurs.   Labs:   Lab Results  Component Value Date   WBC 7.5 07/17/2023   HGB 12.5 (L) 07/17/2023   HCT 37.6 (L) 07/17/2023   MCV 83.2 07/17/2023   PLT 244 07/17/2023    Recent Labs  Lab 07/17/23 0245  NA 132*  K 3.3*  CL 98  CO2 22  BUN 15  CREATININE 0.82  CALCIUM 8.7*  GLUCOSE 332*   Lab Results  Component Value Date   TROPONINI 0.17 (H) 02/27/2016    Lab Results  Component Value Date   CHOL 198 01/27/2022   CHOL 228 (H) 02/26/2016   Lab Results  Component Value Date   HDL 43 01/27/2022   HDL 41 02/26/2016   Lab Results  Component Value Date   LDLCALC 113 (H) 01/27/2022   LDLCALC 158 (H) 02/26/2016   Lab Results  Component Value Date   TRIG 208 (H) 01/27/2022   TRIG 147 02/26/2016   Lab Results  Component Value Date   CHOLHDL 4.6 01/27/2022   CHOLHDL 5.6 02/26/2016   No results found for:  "LDLDIRECT"    Radiology: DG Chest 2 View  Result Date: 07/17/2023 CLINICAL DATA:  Shortness of breath EXAM: CHEST - 2 VIEW COMPARISON:  04/02/2016 FINDINGS: Sternotomy. Hardware in the cervical spine. No acute airspace disease or pleural effusion. Possible nodule at the right lung base. IMPRESSION: No active cardiopulmonary disease. Possible nodule at the right lung base. Recommend further evaluation with chest CT. Electronically Signed   By: Jasmine Pang M.D.   On: 07/17/2023 03:57     ASSESSMENT AND PLAN:  NSTEMI type I CAD status post three-vessel CABG 2017 Hypertension, hyperlipidemia, diabetes  Currently asymptomatic with no chest/arm discomfort. Plan for left heart catheterization on Monday due to scheduling reasons. Continue current medical management with aspirin, heparin drip, statin, metoprolol and antihypertensives  Signed: Kathryne Gin MD,PhD, [facility] 07/17/2023, 2:45 PM

## 2023-07-17 NOTE — CV Procedure (Signed)
Brief cath PCI and stent inpatient  Non-STEMI multivessel coronary disease coronary bypass surgery  Diagnostic cardiac cath  Left ventriculogram with normal overall left ventricular function EF of 50 to 55%  Coronaries Native left main Short of separate ostia LAD 100% occluded ostially CTO Circumflex serial disease proximal 95 mid 75 distal 75 TIMI-3 flow RCA  occluded proximal CTO   Grafts LIMA to mid LAD widely patent pristine SVG to circumflex 100% occluded at the origin IRA TIMI-3 flow SVG to diagonal 80% mid body TIMI-3 flow   Intervention PCI and stated to SVG to diagonal placement of a 3 oh x 15 mm frontier Onyx to 16 atm reducing lesion from 80 down to 0 TIMI-3 flow maintained throughout  Before possible complex high risk intervention of native circumflex  Patient maintained on Plavix aspirin Transferred back to progressive care Anticipate discharge in 24 to 48 hours

## 2023-07-17 NOTE — ED Triage Notes (Signed)
Pt to triage via w/c with no distress noted; reports tonight having SHOB and pain in both arms

## 2023-07-18 DIAGNOSIS — I214 Non-ST elevation (NSTEMI) myocardial infarction: Secondary | ICD-10-CM | POA: Diagnosis not present

## 2023-07-18 LAB — BASIC METABOLIC PANEL
Anion gap: 7 (ref 5–15)
BUN: 13 mg/dL (ref 8–23)
CO2: 24 mmol/L (ref 22–32)
Calcium: 8.5 mg/dL — ABNORMAL LOW (ref 8.9–10.3)
Chloride: 105 mmol/L (ref 98–111)
Creatinine, Ser: 0.85 mg/dL (ref 0.61–1.24)
GFR, Estimated: >60 mL/min (ref >60)
Glucose, Bld: 261 mg/dL — ABNORMAL HIGH (ref 70–99)
Potassium: 3.6 mmol/L (ref 3.5–5.1)
Sodium: 136 mmol/L (ref 135–145)

## 2023-07-18 LAB — CBC
HCT: 36.4 % — ABNORMAL LOW (ref 39.0–52.0)
Hemoglobin: 12.1 g/dL — ABNORMAL LOW (ref 13.0–17.0)
MCH: 27.3 pg (ref 26.0–34.0)
MCHC: 33.2 g/dL (ref 30.0–36.0)
MCV: 82.2 fL (ref 80.0–100.0)
Platelets: 225 10*3/uL (ref 150–400)
RBC: 4.43 MIL/uL (ref 4.22–5.81)
RDW: 13.8 % (ref 11.5–15.5)
WBC: 7.9 10*3/uL (ref 4.0–10.5)
nRBC: 0 % (ref 0.0–0.2)

## 2023-07-18 LAB — GLUCOSE, CAPILLARY
Glucose-Capillary: 207 mg/dL — ABNORMAL HIGH (ref 70–99)
Glucose-Capillary: 310 mg/dL — ABNORMAL HIGH (ref 70–99)

## 2023-07-18 LAB — HIV ANTIBODY (ROUTINE TESTING W REFLEX): HIV Screen 4th Generation wRfx: NONREACTIVE

## 2023-07-18 LAB — CARDIAC CATHETERIZATION: Cath EF Quantitative: 50 %

## 2023-07-18 MED ORDER — ROSUVASTATIN CALCIUM 40 MG PO TABS: 40.0000 mg | ORAL_TABLET | Freq: Every day | ORAL | 0 refills | Status: AC

## 2023-07-18 MED ORDER — ISOSORBIDE MONONITRATE ER 30 MG PO TB24: 30.0000 mg | ORAL_TABLET | Freq: Every day | ORAL | Status: DC

## 2023-07-18 MED ORDER — CETIRIZINE HCL 10 MG PO TABS: 10.0000 mg | ORAL_TABLET | Freq: Every day | ORAL | 0 refills | Status: DC

## 2023-07-18 MED ORDER — SPIRONOLACTONE 25 MG PO TABS: 12.5000 mg | ORAL_TABLET | Freq: Every day | ORAL | 5 refills | Status: DC

## 2023-07-18 MED ORDER — SPIRONOLACTONE 12.5 MG HALF TABLET: 12.5000 mg | ORAL_TABLET | Freq: Every day | ORAL | Status: DC

## 2023-07-18 MED ORDER — ASPIRIN 81 MG PO TBEC: 81.0000 mg | DELAYED_RELEASE_TABLET | Freq: Every day | ORAL | 0 refills | Status: AC

## 2023-07-18 MED ORDER — ISOSORBIDE MONONITRATE ER 30 MG PO TB24: 30.0000 mg | ORAL_TABLET | Freq: Every day | ORAL | 5 refills | Status: DC

## 2023-07-18 MED ORDER — HYDRALAZINE HCL 25 MG PO TABS: 25.0000 mg | ORAL_TABLET | Freq: Two times a day (BID) | ORAL | Status: DC

## 2023-07-18 MED ORDER — HYDRALAZINE HCL 25 MG PO TABS: 25.0000 mg | ORAL_TABLET | Freq: Two times a day (BID) | ORAL | 5 refills | Status: DC

## 2023-07-18 NOTE — Hospital Course (Signed)
HPI: Tyler Russell is a 67 y.o. male with medical history significant of CAD status post CABG, HTN, HLD, IIDM, anxiety/depression, presented with epigastric pain after eating a large dinner in a restaurant, feeling nauseous and vomited stomach content x 1 nonbloody none bile. Then patient started to feel shortness of breath and decided to come to ED. Symptoms eased sometime last night. He also complaining about worsening of right shoulder pain which he attributed to poorly controlled arthritis.   Hospital course / significant events:  11/01: to ED. ST-T depressions II, III and aVF  Troponin 633> 1867. Heparin. Cardiology --> to cath lab, PCI/stent SVG to diagonal.   Consultants:  Cardiology  Procedures/Surgeries: 07/17/2023: cardiac cath       ASSESSMENT & PLAN:   ACS, NSTEMI S/p cardiac cath 11/01 w/ stent Troponin peak 4110 on 11/01 at 15:00   Echo EF 45-50, Grade I diast df Heparin drip Aspirin ACE inhibitor Beta-blocker  Statin Cardiology following   HTN, uncontrolled Mild LVH on echo  Resume home BP meds including amlodipine, lisinopril and metoprolol   IIDM with hyperglycemia poorly controlled, A1C 10.5 Hold home metformin  SSI for now   Hypokalemia Replace as needed Monitor BMP   Hyponatremia likely pseudohyponatremia d/t hyperglycemia - resolved Trend BMP  Mixed hyperlipidemia gemfibrozil Crestor    *** based on BMI: Body mass index is 28.29 kg/m.  Underweight - under 18.5  normal weight - 18.5 to 24.9 overweight - 25 to 29.9 obese - 30 or more   DVT prophylaxis: *** IV fluids: *** continuous IV fluids  Nutrition: *** Central lines / invasive devices: ***  Code Status: *** ACP documentation reviewed: *** none on file in [facility]  TOC needs: *** Barriers to dispo / significant pending items: ***

## 2023-07-18 NOTE — Plan of Care (Signed)
  Problem: Education: Goal: Ability to describe self-care measures that may prevent or decrease complications (Diabetes Survival Skills Education) will improve Outcome: Progressing Goal: Individualized Educational Video(s) Outcome: Progressing   Problem: Coping: Goal: Ability to adjust to condition or change in health will improve Outcome: Progressing   Problem: Fluid Volume: Goal: Ability to maintain a balanced intake and output will improve Outcome: Progressing   Problem: Health Behavior/Discharge Planning: Goal: Ability to identify and utilize available resources and services will improve Outcome: Progressing Goal: Ability to manage health-related needs will improve Outcome: Progressing   Problem: Metabolic: Goal: Ability to maintain appropriate glucose levels will improve Outcome: Progressing   Problem: Nutritional: Goal: Maintenance of adequate nutrition will improve Outcome: Progressing Goal: Progress toward achieving an optimal weight will improve Outcome: Progressing   Problem: Skin Integrity: Goal: Risk for impaired skin integrity will decrease Outcome: Progressing   Problem: Tissue Perfusion: Goal: Adequacy of tissue perfusion will improve Outcome: Progressing   Problem: Education: Goal: Understanding of cardiac disease, CV risk reduction, and recovery process will improve Outcome: Progressing Goal: Individualized Educational Video(s) Outcome: Progressing   Problem: Activity: Goal: Ability to tolerate increased activity will improve Outcome: Progressing   Problem: Cardiac: Goal: Ability to achieve and maintain adequate cardiovascular perfusion will improve Outcome: Progressing   Problem: Health Behavior/Discharge Planning: Goal: Ability to safely manage health-related needs after discharge will improve Outcome: Progressing   Problem: Education: Goal: Knowledge of General Education information will improve Description: Including pain rating scale,  medication(s)/side effects and non-pharmacologic comfort measures Outcome: Progressing   Problem: Health Behavior/Discharge Planning: Goal: Ability to manage health-related needs will improve Outcome: Progressing   Problem: Clinical Measurements: Goal: Ability to maintain clinical measurements within normal limits will improve Outcome: Progressing Goal: Will remain free from infection Outcome: Progressing Goal: Diagnostic test results will improve Outcome: Progressing Goal: Respiratory complications will improve Outcome: Progressing Goal: Cardiovascular complication will be avoided Outcome: Progressing   Problem: Activity: Goal: Risk for activity intolerance will decrease Outcome: Progressing   Problem: Nutrition: Goal: Adequate nutrition will be maintained Outcome: Progressing   Problem: Coping: Goal: Level of anxiety will decrease Outcome: Progressing   Problem: Elimination: Goal: Will not experience complications related to bowel motility Outcome: Progressing Goal: Will not experience complications related to urinary retention Outcome: Progressing   Problem: Pain Management: Goal: General experience of comfort will improve Outcome: Progressing   Problem: Safety: Goal: Ability to remain free from injury will improve Outcome: Progressing   Problem: Skin Integrity: Goal: Risk for impaired skin integrity will decrease Outcome: Progressing   Problem: Education: Goal: Understanding of CV disease, CV risk reduction, and recovery process will improve Outcome: Progressing Goal: Individualized Educational Video(s) Outcome: Progressing   Problem: Activity: Goal: Ability to return to baseline activity level will improve Outcome: Progressing   Problem: Cardiovascular: Goal: Ability to achieve and maintain adequate cardiovascular perfusion will improve Outcome: Progressing Goal: Vascular access site(s) Level 0-1 will be maintained Outcome: Progressing   Problem:  Health Behavior/Discharge Planning: Goal: Ability to safely manage health-related needs after discharge will improve Outcome: Progressing

## 2023-07-18 NOTE — Progress Notes (Signed)
[facility] Cardiology    SUBJECTIVE: Patient seems to be doing well no chest pain no shortness of breath ambulating well in the hall no groin issues tolerating medications well patient feels comfortable enough for discharge home today   Vitals:   07/17/23 2313 07/18/23 0001 07/18/23 0403 07/18/23 0902  BP: (!) 141/70 134/65 (!) 145/73 (!) 160/63  Pulse: 68 68 68 70  Resp: 13 (!) 22 16 16   Temp: 97.9 F (36.6 C) 98 F (36.7 C) 97.9 F (36.6 C) 98.5 F (36.9 C)  TempSrc: Oral  Oral   SpO2: 95% 93% 95% 97%  Weight:      Height:         Intake/Output Summary (Last 24 hours) at 07/18/2023 1100 Last data filed at 07/18/2023 0500 Gross per 24 hour  Intake 1152.85 ml  Output 1150 ml  Net 2.85 ml      PHYSICAL EXAM  General: Well developed, well nourished, in no acute distress HEENT:  Normocephalic and atramatic Neck:  No JVD.  Lungs: Clear bilaterally to auscultation and percussion. Heart: HRRR . Normal S1 and S2 without gallops or murmurs.  Abdomen: Bowel sounds are positive, abdomen soft and non-tender  Msk:  Back normal, normal gait. Normal strength and tone for age. Extremities: No clubbing, cyanosis or edema.   Neuro: Alert and oriented X 3. Psych:  Good affect, responds appropriately   LABS: Basic Metabolic Panel: Recent Labs    07/17/23 0245 07/18/23 0321  NA 132* 136  K 3.3* 3.6  CL 98 105  CO2 22 24  GLUCOSE 332* 261*  BUN 15 13  CREATININE 0.82 0.85  CALCIUM 8.7* 8.5*   Liver Function Tests: No results for input(s): "AST", "ALT", "ALKPHOS", "BILITOT", "PROT", "ALBUMIN" in the last 72 hours. No results for input(s): "LIPASE", "AMYLASE" in the last 72 hours. CBC: Recent Labs    07/17/23 0245 07/18/23 0321  WBC 7.5 7.9  HGB 12.5* 12.1*  HCT 37.6* 36.4*  MCV 83.2 82.2  PLT 244 225   Cardiac Enzymes: No results for input(s): "CKTOTAL", "CKMB", "CKMBINDEX", "TROPONINI" in the last 72 hours. BNP: Invalid input(s): "POCBNP" D-Dimer: No results for  input(s): "DDIMER" in the last 72 hours. Hemoglobin A1C: Recent Labs    07/17/23 0245  HGBA1C 10.5*   Fasting Lipid Panel: No results for input(s): "CHOL", "HDL", "LDLCALC", "TRIG", "CHOLHDL", "LDLDIRECT" in the last 72 hours. Thyroid Function Tests: No results for input(s): "TSH", "T4TOTAL", "T3FREE", "THYROIDAB" in the last 72 hours.  Invalid input(s): "FREET3" Anemia Panel: No results for input(s): "VITAMINB12", "FOLATE", "FERRITIN", "TIBC", "IRON", "RETICCTPCT" in the last 72 hours.  ECHOCARDIOGRAM COMPLETE  Result Date: 07/17/2023    ECHOCARDIOGRAM REPORT   Patient Name:   Tyler Russell Date of Exam: 07/17/2023 Medical Rec #:  [id number]       Height:       65.0 in Accession #:    [id number]      Weight:       170.0 lb Date of Birth:  11/03/1955       BSA:          1.846 m Patient Age:    67 years        BP:           157/93 mmHg Patient Gender: M               HR:           79 bpm. Exam Location:  [facility] Procedure: 2D Echo, Cardiac  Doppler and Color Doppler Indications:     NSTEMI I21.4  History:         Patient has prior history of Echocardiogram examinations, most                  recent 01/27/2022.  Sonographer:     Cristela Blue Referring Phys:  [id number] Kathryne Gin Diagnosing Phys: Mellody Drown Alluri IMPRESSIONS  1. Left ventricular ejection fraction, by estimation, is 45 to 50%. The left ventricle has mildly decreased function. There is mild left ventricular hypertrophy. Left ventricular diastolic parameters are consistent with Grade I diastolic dysfunction (impaired relaxation).  2. Right ventricular systolic function is normal. The right ventricular size is normal.  3. Mild mitral valve regurgitation.  4. The aortic valve was not well visualized. There is mild thickening of the aortic valve. Aortic valve regurgitation is not visualized. FINDINGS  Left Ventricle: Left ventricular ejection fraction, by estimation, is 45 to 50%. The left ventricle has mildly decreased function. The left  ventricular internal cavity size was normal in size. There is mild left ventricular hypertrophy. Left ventricular  diastolic parameters are consistent with Grade I diastolic dysfunction (impaired relaxation).  LV Wall Scoring: The inferior wall, basal anteroseptal segment, and basal inferoseptal segment are hypokinetic. The entire anterior wall, entire lateral wall, mid and distal anterior septum, entire apex, and mid inferoseptal segment are normal. Right Ventricle: The right ventricular size is normal. Right vetricular wall thickness was not well visualized. Right ventricular systolic function is normal. Left Atrium: Left atrial size was normal in size. Right Atrium: Right atrial size was normal in size. Pericardium: There is no evidence of pericardial effusion. Mitral Valve: There is mild thickening of the mitral valve leaflet(s). Mild mitral annular calcification. Mild mitral valve regurgitation. MV peak gradient, 6.2 mmHg. The mean mitral valve gradient is 3.0 mmHg. Tricuspid Valve: The tricuspid valve is normal in structure. Tricuspid valve regurgitation is trivial. Aortic Valve: The aortic valve was not well visualized. There is mild thickening of the aortic valve. Aortic valve regurgitation is not visualized. Aortic valve mean gradient measures 3.5 mmHg. Aortic valve peak gradient measures 6.8 mmHg. Aortic valve area, by VTI measures 2.97 cm. Pulmonic Valve: The pulmonic valve was not well visualized. Pulmonic valve regurgitation is trivial. Aorta: The aortic root is normal in size and structure. IAS/Shunts: The interatrial septum was not well visualized.  LEFT VENTRICLE PLAX 2D LVIDd:         4.90 cm     Diastology LVIDs:         4.50 cm     LV e' medial:    5.11 cm/s LV PW:         1.10 cm     LV E/e' medial:  18.9 LV IVS:        1.00 cm     LV e' lateral:   10.60 cm/s LVOT diam:     2.00 cm     LV E/e' lateral: 9.1 LV SV:         69 LV SV Index:   38 LVOT Area:     3.14 cm  LV Volumes (MOD) LV vol d,  MOD A2C: 84.2 ml LV vol d, MOD A4C: 88.3 ml LV vol s, MOD A2C: 45.2 ml LV vol s, MOD A4C: 50.4 ml LV SV MOD A2C:     39.0 ml LV SV MOD A4C:     88.3 ml LV SV MOD BP:      41.0 ml  RIGHT VENTRICLE RV Basal diam:  3.90 cm RV Mid diam:    3.20 cm RV S prime:     13.40 cm/s TAPSE (M-mode): 1.9 cm LEFT ATRIUM             Index        RIGHT ATRIUM           Index LA diam:        4.90 cm 2.65 cm/m   RA Area:     13.60 cm LA Vol (A2C):   38.0 ml 20.58 ml/m  RA Volume:   32.40 ml  17.55 ml/m LA Vol (A4C):   55.1 ml 29.85 ml/m LA Biplane Vol: 46.0 ml 24.92 ml/m  AORTIC VALVE AV Area (Vmax):    2.15 cm AV Area (Vmean):   2.43 cm AV Area (VTI):     2.97 cm AV Vmax:           130.50 cm/s AV Vmean:          87.500 cm/s AV VTI:            0.234 m AV Peak Grad:      6.8 mmHg AV Mean Grad:      3.5 mmHg LVOT Vmax:         89.40 cm/s LVOT Vmean:        67.600 cm/s LVOT VTI:          0.221 m LVOT/AV VTI ratio: 0.94  AORTA Ao Root diam: 3.30 cm MITRAL VALVE                TRICUSPID VALVE MV Area (PHT): 5.31 cm     TR Peak grad:   8.8 mmHg MV Area VTI:   2.19 cm     TR Vmax:        148.00 cm/s MV Peak grad:  6.2 mmHg MV Mean grad:  3.0 mmHg     SHUNTS MV Vmax:       1.25 m/s     Systemic VTI:  0.22 m MV Vmean:      83.1 cm/s    Systemic Diam: 2.00 cm MV Decel Time: 143 msec MV E velocity: 96.40 cm/s MV A velocity: 117.00 cm/s MV E/A ratio:  0.82 Windell Norfolk Electronically signed by Windell Norfolk Signature Date/Time: 07/17/2023/4:41:42 PM    Final    DG Chest 2 View  Result Date: 07/17/2023 CLINICAL DATA:  Shortness of breath EXAM: CHEST - 2 VIEW COMPARISON:  04/02/2016 FINDINGS: Sternotomy. Hardware in the cervical spine. No acute airspace disease or pleural effusion. Possible nodule at the right lung base. IMPRESSION: No active cardiopulmonary disease. Possible nodule at the right lung base. Recommend further evaluation with chest CT. Electronically Signed   By: Jasmine Pang M.D.   On: 07/17/2023 03:57     Echo  mild reduced left ventricular function EF around 45 to 50%  TELEMETRY: Normal sinus rhythm rate of 70 nonspecific ST-T wave changes:  ASSESSMENT AND PLAN:  Principal Problem:   Non-STEMI (non-ST elevated myocardial infarction) [facility]) Active Problems:   NSTEMI (non-ST elevated myocardial infarction) (HCC) History of coronary bypass surgery Hyperlipidemia Hypertension Peripheral vascular disease Status post PCI and stent SVG to diagonal  Plan Agree with medical management on telemetry patient has improved dramatically with ambulation may be safe for early discharge today follow-up with cardiology 1 to 2 weeks Peripheral vascular disease known previous CVA have the patient follow-up routinely with vascular continue aspirin Plavix statin blood pressure control Continue  aspirin Plavix statin beta-blocker ARB Continue statin therapy for hyperlipidemia Will consider outpatient consultation for complex high risk intervention of native circumflex Patient should be referred to cardiac rehab Recommend aggressive blood pressure management Patient safe for discharge home on recommended cardiac meds  Cardiac meds on discharge Aspirin 81 mg daily Plavix 75 mg daily Metoprolol succinate 100 mg daily Losartan 100 mg daily Imdur 30 mg daily Spironolactone 12.5 daily Crestor 40 mg daily Hydralazine 25 mg twice a day  Discontinued meds Discontinue HCTZ   Alwyn Pea, MD 07/18/2023 11:00 AM

## 2023-07-18 NOTE — Discharge Summary (Addendum)
Physician Discharge Summary   Patient: Tyler Russell MRN: [id number]  DOB: 26-Nov-1955   Admit:     Date of Admission: 07/17/2023 Admitted from: home   Discharge: Date of discharge: 07/18/23 Disposition: Home Condition at discharge: good  CODE STATUS: FULL CODE     Discharge Physician: Sunnie Nielsen, DO [facility]     PCP: [facility]  Recommendations for Outpatient Follow-up:  Follow up with PCP [facility] in 1-2 weeks Follow up as directed w/ cardiology and cardiac rehab    Discharge Instructions     AMB Referral to Cardiac Rehabilitation - Phase II   Complete by: As directed    Diagnosis:  Coronary Stents NSTEMI     After initial evaluation and assessments completed: Virtual Based Care may be provided alone or in conjunction with Phase 2 Cardiac Rehab based on patient barriers.: Yes   Diet - low sodium heart healthy   Complete by: As directed    Increase activity slowly   Complete by: As directed          Discharge Diagnoses: Principal Problem:   Non-STEMI (non-ST elevated myocardial infarction) (HCC) Active Problems:   NSTEMI (non-ST elevated myocardial infarction) (HCC)       HPI: Tyler Russell is a 67 y.o. male with medical history significant of CAD status post CABG, HTN, HLD, IIDM, anxiety/depression, presented with epigastric pain after eating a large dinner in a restaurant, feeling nauseous and vomited stomach content x 1 nonbloody none bile. Then patient started to feel shortness of breath and decided to come to ED. Symptoms eased sometime last night. He also complaining about worsening of right shoulder pain which he attributed to poorly controlled arthritis.   Hospital course / significant events:  11/01: to ED. ST-T depressions II, III and aVF  Troponin 633> 1867. Heparin. Cardiology --> to cath lab, PCI/stent SVG to diagonal.   Consultants:  Cardiology  Procedures/Surgeries: 07/17/2023: cardiac cath        ASSESSMENT & PLAN:   ACS, NSTEMI S/p cardiac cath 11/01 w/ stent Troponin peak 4110 on 11/01 at 15:00   Echo EF 45-50, Grade I diast df Heparin drip Aspirin ACE inhibitor Beta-blocker  Statin Cardiology following   HTN, uncontrolled Mild LVH on echo  Resume home BP meds including amlodipine, lisinopril-HCTZ and metoprolol   IIDM with hyperglycemia poorly controlled, A1C 10.5 Follow w/ PCP asap    Hypokalemia Replace as needed Monitor BMP   Hyponatremia likely pseudohyponatremia d/t hyperglycemia - resolved Trend BMP  Mixed hyperlipidemia Crestor           Discharge Instructions  Allergies as of 07/18/2023       Reactions   Atorvastatin    Other reaction(s): Muscle Pain   Vicodin [hydrocodone-acetaminophen] Itching   Lactalbumin    Other reaction(s): Other (See Comments) Sneezing and post nasal drip   Milk-related Compounds Other (See Comments)   Sneezing and post nasal drip        Medication List     STOP taking these medications    gemfibrozil 600 MG tablet Commonly known as: LOPID       TAKE these medications    albuterol 108 (90 Base) MCG/ACT inhaler Commonly known as: VENTOLIN HFA Inhale 2 puffs into the lungs every 6 (six) hours as needed for wheezing or shortness of breath.   aspirin EC 81 MG tablet Take 1 tablet (81 mg total) by mouth daily.   blood glucose meter kit and  supplies Dispense based on patient and insurance preference. Use up to four times daily as directed. (FOR ICD-9 250.00, 250.01).   cetirizine 10 MG tablet Commonly known as: ZYRTEC Take 1 tablet (10 mg total) by mouth daily.   clopidogrel 75 MG tablet Commonly known as: PLAVIX Take 75 mg by mouth daily.   diphenhydrAMINE 25 MG tablet Commonly known as: BENADRYL Take 25 mg by mouth every 6 (six) hours as needed.   escitalopram 20 MG tablet Commonly known as: LEXAPRO Take 20 mg by mouth daily.   losartan-hydrochlorothiazide 100-12.5 MG  tablet Commonly known as: HYZAAR Take 1 tablet by mouth daily.   metFORMIN 500 MG 24 hr tablet Commonly known as: GLUCOPHAGE-XR Take 500 mg by mouth 2 (two) times daily.   metoprolol succinate 100 MG 24 hr tablet Commonly known as: TOPROL-XL Take 100 mg by mouth daily.   pantoprazole 40 MG tablet Commonly known as: PROTONIX Take 40 mg by mouth daily.   rosuvastatin 40 MG tablet Commonly known as: CRESTOR Take 1 tablet (40 mg total) by mouth daily.         Follow-up Information     Custovic, Rozell Searing, DO. Schedule an appointment as soon as possible for a visit.   Specialty: Cardiology Why: hospital follow up - NSTEMI, cardiac cath / stent Contact information: [address] [place] [address] [phone]         [facility] Schedule an appointment as soon as possible for a visit.   Why: hospital follow up Contact information: [address] [place] [address] [phone]                 Allergies  Allergen Reactions   Atorvastatin     Other reaction(s): Muscle Pain   Vicodin [Hydrocodone-Acetaminophen] Itching   Lactalbumin     Other reaction(s): Other (See Comments) Sneezing and post nasal drip   Milk-Related Compounds Other (See Comments)    Sneezing and post nasal drip     Subjective: pt reports chest pain has not recurred, denies current CP/SOB, no HA/VC, no N/V, ambulating, tolerating diet, no dizziness or lightheadedness, no CP on exertion    Discharge Exam: BP 132/78 (BP Location: Right Arm)   Pulse 64   Temp 98 F (36.7 C)   Resp 12   Ht 5\' 5"  (1.651 m)   Wt 77.1 kg   SpO2 97%   BMI 28.29 kg/m  General: Pt is alert, awake, not in acute distress Cardiovascular: RRR, S1/S2 +, no rubs, no gallops Respiratory: CTA bilaterally, no wheezing, no rhonchi Abdominal: Soft, NT, ND, bowel sounds + Extremities: no edema, no cyanosis     The results of significant diagnostics from this hospitalization  (including imaging, microbiology, ancillary and laboratory) are listed below for reference.     Microbiology: No results found for this or any previous visit (from the past 240 hour(s)).   Labs: BNP (last 3 results) No results for input(s): "BNP" in the last 8760 hours. Basic Metabolic Panel: Recent Labs  Lab 07/17/23 0245 07/18/23 0321  NA 132* 136  K 3.3* 3.6  CL 98 105  CO2 22 24  GLUCOSE 332* 261*  BUN 15 13  CREATININE 0.82 0.85  CALCIUM 8.7* 8.5*   Liver Function Tests: No results for input(s): "AST", "ALT", "ALKPHOS", "BILITOT", "PROT", "ALBUMIN" in the last 168 hours. No results for input(s): "LIPASE", "AMYLASE" in the last 168 hours. No results for input(s): "AMMONIA" in the last 168 hours. CBC: Recent  Labs  Lab 07/17/23 0245 07/18/23 0321  WBC 7.5 7.9  HGB 12.5* 12.1*  HCT 37.6* 36.4*  MCV 83.2 82.2  PLT 244 225   Cardiac Enzymes: No results for input(s): "CKTOTAL", "CKMB", "CKMBINDEX", "TROPONINI" in the last 168 hours. BNP: Invalid input(s): "POCBNP" CBG: Recent Labs  Lab 07/17/23 1145 07/17/23 1604 07/17/23 2145 07/18/23 0852 07/18/23 1142  GLUCAP 179* 145* 254* 207* 310*   D-Dimer No results for input(s): "DDIMER" in the last 72 hours. Hgb A1c Recent Labs    07/17/23 0245  HGBA1C 10.5*   Lipid Profile No results for input(s): "CHOL", "HDL", "LDLCALC", "TRIG", "CHOLHDL", "LDLDIRECT" in the last 72 hours. Thyroid function studies No results for input(s): "TSH", "T4TOTAL", "T3FREE", "THYROIDAB" in the last 72 hours.  Invalid input(s): "FREET3" Anemia work up No results for input(s): "VITAMINB12", "FOLATE", "FERRITIN", "TIBC", "IRON", "RETICCTPCT" in the last 72 hours. Urinalysis    Component Value Date/Time   COLORURINE YELLOW (A) 01/26/2022 0834   APPEARANCEUR CLEAR (A) 01/26/2022 0834   APPEARANCEUR Clear 12/03/2017 1445   LABSPEC 1.018 01/26/2022 0834   PHURINE 7.0 01/26/2022 0834   GLUCOSEU NEGATIVE 01/26/2022 0834   HGBUR  NEGATIVE 01/26/2022 0834   BILIRUBINUR NEGATIVE 01/26/2022 0834   BILIRUBINUR Negative 12/03/2017 1445   KETONESUR NEGATIVE 01/26/2022 0834   PROTEINUR NEGATIVE 01/26/2022 0834   UROBILINOGEN 2.0 (H) 09/03/2011 1358   NITRITE NEGATIVE 01/26/2022 0834   LEUKOCYTESUR NEGATIVE 01/26/2022 0834   Sepsis Labs Recent Labs  Lab 07/17/23 0245 07/18/23 0321  WBC 7.5 7.9   Microbiology No results found for this or any previous visit (from the past 240 hour(s)). Imaging ECHOCARDIOGRAM COMPLETE  Result Date: 07/17/2023    ECHOCARDIOGRAM REPORT   Patient Name:   Tyler Russell Date of Exam: 07/17/2023 Medical Rec #:  [id number]       Height:       65.0 in Accession #:    [id number]      Weight:       170.0 lb Date of Birth:  [birth year]       BSA:          1.846 m Patient Age:    33 years        BP:           157/93 mmHg Patient Gender: M               HR:           79 bpm. Exam Location:  [facility] Procedure: 2D Echo, Cardiac Doppler and Color Doppler Indications:     NSTEMI I21.4  History:         Patient has prior history of Echocardiogram examinations, most                  recent 01/27/2022.  Sonographer:     Cristela Blue Referring Phys:  [id number] Kathryne Gin Diagnosing Phys: Mellody Drown Alluri IMPRESSIONS  1. Left ventricular ejection fraction, by estimation, is 45 to 50%. The left ventricle has mildly decreased function. There is mild left ventricular hypertrophy. Left ventricular diastolic parameters are consistent with Grade I diastolic dysfunction (impaired relaxation).  2. Right ventricular systolic function is normal. The right ventricular size is normal.  3. Mild mitral valve regurgitation.  4. The aortic valve was not well visualized. There is mild thickening of the aortic valve. Aortic valve regurgitation is not visualized. FINDINGS  Left Ventricle: Left ventricular ejection fraction, by estimation, is 45 to 50%. The left ventricle has  mildly decreased function. The left ventricular internal cavity size  was normal in size. There is mild left ventricular hypertrophy. Left ventricular  diastolic parameters are consistent with Grade I diastolic dysfunction (impaired relaxation).  LV Wall Scoring: The inferior wall, basal anteroseptal segment, and basal inferoseptal segment are hypokinetic. The entire anterior wall, entire lateral wall, mid and distal anterior septum, entire apex, and mid inferoseptal segment are normal. Right Ventricle: The right ventricular size is normal. Right vetricular wall thickness was not well visualized. Right ventricular systolic function is normal. Left Atrium: Left atrial size was normal in size. Right Atrium: Right atrial size was normal in size. Pericardium: There is no evidence of pericardial effusion. Mitral Valve: There is mild thickening of the mitral valve leaflet(s). Mild mitral annular calcification. Mild mitral valve regurgitation. MV peak gradient, 6.2 mmHg. The mean mitral valve gradient is 3.0 mmHg. Tricuspid Valve: The tricuspid valve is normal in structure. Tricuspid valve regurgitation is trivial. Aortic Valve: The aortic valve was not well visualized. There is mild thickening of the aortic valve. Aortic valve regurgitation is not visualized. Aortic valve mean gradient measures 3.5 mmHg. Aortic valve peak gradient measures 6.8 mmHg. Aortic valve area, by VTI measures 2.97 cm. Pulmonic Valve: The pulmonic valve was not well visualized. Pulmonic valve regurgitation is trivial. Aorta: The aortic root is normal in size and structure. IAS/Shunts: The interatrial septum was not well visualized.  LEFT VENTRICLE PLAX 2D LVIDd:         4.90 cm     Diastology LVIDs:         4.50 cm     LV e' medial:    5.11 cm/s LV PW:         1.10 cm     LV E/e' medial:  18.9 LV IVS:        1.00 cm     LV e' lateral:   10.60 cm/s LVOT diam:     2.00 cm     LV E/e' lateral: 9.1 LV SV:         69 LV SV Index:   38 LVOT Area:     3.14 cm  LV Volumes (MOD) LV vol d, MOD A2C: 84.2 ml LV vol d, MOD A4C:  88.3 ml LV vol s, MOD A2C: 45.2 ml LV vol s, MOD A4C: 50.4 ml LV SV MOD A2C:     39.0 ml LV SV MOD A4C:     88.3 ml LV SV MOD BP:      41.0 ml RIGHT VENTRICLE RV Basal diam:  3.90 cm RV Mid diam:    3.20 cm RV S prime:     13.40 cm/s TAPSE (M-mode): 1.9 cm LEFT ATRIUM             Index        RIGHT ATRIUM           Index LA diam:        4.90 cm 2.65 cm/m   RA Area:     13.60 cm LA Vol (A2C):   38.0 ml 20.58 ml/m  RA Volume:   32.40 ml  17.55 ml/m LA Vol (A4C):   55.1 ml 29.85 ml/m LA Biplane Vol: 46.0 ml 24.92 ml/m  AORTIC VALVE AV Area (Vmax):    2.15 cm AV Area (Vmean):   2.43 cm AV Area (VTI):     2.97 cm AV Vmax:           130.50 cm/s AV Vmean:  87.500 cm/s AV VTI:            0.234 m AV Peak Grad:      6.8 mmHg AV Mean Grad:      3.5 mmHg LVOT Vmax:         89.40 cm/s LVOT Vmean:        67.600 cm/s LVOT VTI:          0.221 m LVOT/AV VTI ratio: 0.94  AORTA Ao Root diam: 3.30 cm MITRAL VALVE                TRICUSPID VALVE MV Area (PHT): 5.31 cm     TR Peak grad:   8.8 mmHg MV Area VTI:   2.19 cm     TR Vmax:        148.00 cm/s MV Peak grad:  6.2 mmHg MV Mean grad:  3.0 mmHg     SHUNTS MV Vmax:       1.25 m/s     Systemic VTI:  0.22 m MV Vmean:      83.1 cm/s    Systemic Diam: 2.00 cm MV Decel Time: 143 msec MV E velocity: 96.40 cm/s MV A velocity: 117.00 cm/s MV E/A ratio:  0.82 Windell Norfolk Electronically signed by Windell Norfolk Signature Date/Time: 07/17/2023/4:41:42 PM    Final    DG Chest 2 View  Result Date: 07/17/2023 CLINICAL DATA:  Shortness of breath EXAM: CHEST - 2 VIEW COMPARISON:  04/02/2016 FINDINGS: Sternotomy. Hardware in the cervical spine. No acute airspace disease or pleural effusion. Possible nodule at the right lung base. IMPRESSION: No active cardiopulmonary disease. Possible nodule at the right lung base. Recommend further evaluation with chest CT. Electronically Signed   By: Jasmine Pang M.D.   On: 07/17/2023 03:57      Time coordinating discharge: over 30  minutes  SIGNED:  Sunnie Nielsen DO [facility]

## 2023-07-20 ENCOUNTER — Encounter: Payer: Self-pay | Admitting: Internal Medicine

## 2023-07-20 LAB — LIPOPROTEIN A (LPA): Lipoprotein (a): 211.4 nmol/L — ABNORMAL HIGH (ref <75.0)

## 2023-07-22 DIAGNOSIS — I25810 Atherosclerosis of coronary artery bypass graft(s) without angina pectoris: Secondary | ICD-10-CM | POA: Diagnosis not present

## 2023-07-22 DIAGNOSIS — Z951 Presence of aortocoronary bypass graft: Secondary | ICD-10-CM | POA: Diagnosis not present

## 2023-07-22 DIAGNOSIS — E1159 Type 2 diabetes mellitus with other circulatory complications: Secondary | ICD-10-CM | POA: Diagnosis not present

## 2023-07-22 DIAGNOSIS — I10 Essential (primary) hypertension: Secondary | ICD-10-CM | POA: Diagnosis not present

## 2023-07-22 DIAGNOSIS — E782 Mixed hyperlipidemia: Secondary | ICD-10-CM | POA: Diagnosis not present

## 2023-12-25 DIAGNOSIS — Z800 Family history of malignant neoplasm of digestive organs: Secondary | ICD-10-CM

## 2023-12-25 DIAGNOSIS — Z7984 Long term (current) use of oral hypoglycemic drugs: Secondary | ICD-10-CM

## 2023-12-25 DIAGNOSIS — F411 Generalized anxiety disorder: Secondary | ICD-10-CM | POA: Diagnosis present

## 2023-12-25 DIAGNOSIS — Z885 Allergy status to narcotic agent status: Secondary | ICD-10-CM

## 2023-12-25 DIAGNOSIS — I10 Essential (primary) hypertension: Secondary | ICD-10-CM | POA: Diagnosis present

## 2023-12-25 DIAGNOSIS — Z888 Allergy status to other drugs, medicaments and biological substances status: Secondary | ICD-10-CM

## 2023-12-25 DIAGNOSIS — K219 Gastro-esophageal reflux disease without esophagitis: Secondary | ICD-10-CM | POA: Diagnosis present

## 2023-12-25 DIAGNOSIS — E782 Mixed hyperlipidemia: Secondary | ICD-10-CM | POA: Diagnosis present

## 2023-12-25 DIAGNOSIS — Z7902 Long term (current) use of antithrombotics/antiplatelets: Secondary | ICD-10-CM

## 2023-12-25 DIAGNOSIS — Z955 Presence of coronary angioplasty implant and graft: Secondary | ICD-10-CM

## 2023-12-25 DIAGNOSIS — Z79899 Other long term (current) drug therapy: Secondary | ICD-10-CM

## 2023-12-25 DIAGNOSIS — Z981 Arthrodesis status: Secondary | ICD-10-CM

## 2023-12-25 DIAGNOSIS — Z7982 Long term (current) use of aspirin: Secondary | ICD-10-CM

## 2023-12-25 DIAGNOSIS — I25110 Atherosclerotic heart disease of native coronary artery with unstable angina pectoris: Secondary | ICD-10-CM | POA: Diagnosis not present

## 2023-12-25 DIAGNOSIS — Z87891 Personal history of nicotine dependence: Secondary | ICD-10-CM

## 2023-12-25 DIAGNOSIS — I200 Unstable angina: Secondary | ICD-10-CM | POA: Diagnosis not present

## 2023-12-25 DIAGNOSIS — Z8249 Family history of ischemic heart disease and other diseases of the circulatory system: Secondary | ICD-10-CM

## 2023-12-25 DIAGNOSIS — I252 Old myocardial infarction: Secondary | ICD-10-CM

## 2023-12-25 DIAGNOSIS — Z951 Presence of aortocoronary bypass graft: Secondary | ICD-10-CM

## 2023-12-25 DIAGNOSIS — E119 Type 2 diabetes mellitus without complications: Secondary | ICD-10-CM | POA: Diagnosis present

## 2023-12-25 DIAGNOSIS — Z91011 Allergy to milk products: Secondary | ICD-10-CM

## 2023-12-26 ENCOUNTER — Inpatient Hospital Stay
Admission: EM | Admit: 2023-12-26 | Discharge: 2023-12-28 | DRG: 287 | Disposition: A | Discharge status: Home / Self Care | Attending: Student | Admitting: Student

## 2023-12-26 ENCOUNTER — Other Ambulatory Visit: Payer: Self-pay

## 2023-12-26 ENCOUNTER — Emergency Department

## 2023-12-26 DIAGNOSIS — I10 Essential (primary) hypertension: Secondary | ICD-10-CM | POA: Diagnosis present

## 2023-12-26 DIAGNOSIS — E119 Type 2 diabetes mellitus without complications: Secondary | ICD-10-CM

## 2023-12-26 DIAGNOSIS — Z951 Presence of aortocoronary bypass graft: Secondary | ICD-10-CM

## 2023-12-26 DIAGNOSIS — R0602 Shortness of breath: Secondary | ICD-10-CM

## 2023-12-26 DIAGNOSIS — R079 Chest pain, unspecified: Principal | ICD-10-CM

## 2023-12-26 DIAGNOSIS — I200 Unstable angina: Secondary | ICD-10-CM | POA: Diagnosis present

## 2023-12-26 DIAGNOSIS — F411 Generalized anxiety disorder: Secondary | ICD-10-CM | POA: Diagnosis present

## 2023-12-26 DIAGNOSIS — E782 Mixed hyperlipidemia: Secondary | ICD-10-CM | POA: Diagnosis present

## 2023-12-26 LAB — HEPARIN LEVEL (UNFRACTIONATED)
Heparin Unfractionated: 0.85 [IU]/mL — ABNORMAL HIGH (ref 0.30–0.70)
Heparin Unfractionated: 0.19 [IU]/mL — ABNORMAL LOW (ref 0.30–0.70)

## 2023-12-26 LAB — CBC
HCT: 36.5 % — ABNORMAL LOW (ref 39.0–52.0)
Hemoglobin: 12.2 g/dL — ABNORMAL LOW (ref 13.0–17.0)
MCH: 27.8 pg (ref 26.0–34.0)
MCHC: 33.4 g/dL (ref 30.0–36.0)
MCV: 83.1 fL (ref 80.0–100.0)
Platelets: 283 10*3/uL (ref 150–400)
RBC: 4.39 MIL/uL (ref 4.22–5.81)
RDW: 14 % (ref 11.5–15.5)
WBC: 8.6 10*3/uL (ref 4.0–10.5)
nRBC: 0 % (ref 0.0–0.2)

## 2023-12-26 LAB — BASIC METABOLIC PANEL WITH GFR
Anion gap: 9 (ref 5–15)
BUN: 19 mg/dL (ref 8–23)
CO2: 24 mmol/L (ref 22–32)
Calcium: 9.5 mg/dL (ref 8.9–10.3)
Chloride: 101 mmol/L (ref 98–111)
Creatinine, Ser: 1.1 mg/dL (ref 0.61–1.24)
GFR, Estimated: >60 mL/min (ref >60)
Glucose, Bld: 177 mg/dL — ABNORMAL HIGH (ref 70–99)
Potassium: 3.7 mmol/L (ref 3.5–5.1)
Sodium: 134 mmol/L — ABNORMAL LOW (ref 135–145)

## 2023-12-26 LAB — CBG MONITORING, ED
Glucose-Capillary: 183 mg/dL — ABNORMAL HIGH (ref 70–99)
Glucose-Capillary: 161 mg/dL — ABNORMAL HIGH (ref 70–99)
Glucose-Capillary: 167 mg/dL — ABNORMAL HIGH (ref 70–99)

## 2023-12-26 LAB — GLUCOSE, CAPILLARY: Glucose-Capillary: 130 mg/dL — ABNORMAL HIGH (ref 70–99)

## 2023-12-26 LAB — TROPONIN I (HIGH SENSITIVITY)
Troponin I (High Sensitivity): 10 ng/L (ref <18)
Troponin I (High Sensitivity): 11 ng/L (ref <18)

## 2023-12-26 MED ORDER — ONDANSETRON HCL 4 MG/2ML IJ SOLN: 4.0000 mg | Freq: Four times a day (QID) | INTRAMUSCULAR | Status: DC | PRN

## 2023-12-26 MED ORDER — INSULIN ASPART 100 UNIT/ML IJ SOLN
0.0000 [IU] | Freq: Three times a day (TID) | INTRAMUSCULAR | Status: DC
  Administered 2023-12-26 (×3): 3 [IU] via SUBCUTANEOUS
  Administered 2023-12-27: 2 [IU] via SUBCUTANEOUS
  Administered 2023-12-27: 3 [IU] via SUBCUTANEOUS
  Filled 2023-12-26 (×5): qty 1

## 2023-12-26 MED ORDER — ROSUVASTATIN CALCIUM 10 MG PO TABS
40.0000 mg | ORAL_TABLET | Freq: Every day | ORAL | Status: DC
  Administered 2023-12-26 – 2023-12-28 (×3): 40 mg via ORAL
  Filled 2023-12-26: qty 2
  Filled 2023-12-26 (×2): qty 4

## 2023-12-26 MED ORDER — HEPARIN (PORCINE) 25000 UT/250ML-% IV SOLN
750.0000 [IU]/h | INTRAVENOUS | Status: DC
  Administered 2023-12-26 – 2023-12-27 (×2): 900 [IU]/h via INTRAVENOUS
  Filled 2023-12-26 (×2): qty 250

## 2023-12-26 MED ORDER — INSULIN ASPART 100 UNIT/ML IJ SOLN: 0.0000 [IU] | Freq: Every day | INTRAMUSCULAR | Status: DC

## 2023-12-26 MED ORDER — MORPHINE SULFATE (PF) 2 MG/ML IV SOLN
2.0000 mg | INTRAVENOUS | Status: DC | PRN
  Administered 2023-12-26: 2 mg via INTRAVENOUS
  Filled 2023-12-26: qty 1

## 2023-12-26 MED ORDER — ASPIRIN 81 MG PO TBEC
81.0000 mg | DELAYED_RELEASE_TABLET | Freq: Every day | ORAL | Status: DC
  Administered 2023-12-27: 81 mg via ORAL
  Filled 2023-12-26: qty 1

## 2023-12-26 MED ORDER — ACETAMINOPHEN 325 MG PO TABS
650.0000 mg | ORAL_TABLET | ORAL | Status: DC | PRN
  Administered 2023-12-26 – 2023-12-27 (×2): 650 mg via ORAL
  Filled 2023-12-26 (×2): qty 2

## 2023-12-26 MED ORDER — CLOPIDOGREL BISULFATE 75 MG PO TABS
75.0000 mg | ORAL_TABLET | Freq: Every day | ORAL | Status: DC
  Administered 2023-12-26 – 2023-12-28 (×3): 75 mg via ORAL
  Filled 2023-12-26 (×3): qty 1

## 2023-12-26 MED ORDER — ASPIRIN 325 MG PO TABS
325.0000 mg | ORAL_TABLET | Freq: Once | ORAL | Status: AC
  Administered 2023-12-26: 325 mg via ORAL
  Filled 2023-12-26: qty 1

## 2023-12-26 MED ORDER — METOPROLOL SUCCINATE ER 100 MG PO TB24
100.0000 mg | ORAL_TABLET | Freq: Every day | ORAL | Status: DC
  Administered 2023-12-26 – 2023-12-28 (×3): 100 mg via ORAL
  Filled 2023-12-26: qty 2
  Filled 2023-12-26 (×2): qty 1

## 2023-12-26 MED ORDER — ESCITALOPRAM OXALATE 10 MG PO TABS
20.0000 mg | ORAL_TABLET | Freq: Every day | ORAL | Status: DC
  Administered 2023-12-26 – 2023-12-28 (×3): 20 mg via ORAL
  Filled 2023-12-26 (×3): qty 2

## 2023-12-26 MED ORDER — HEPARIN BOLUS VIA INFUSION
2150.0000 [IU] | Freq: Once | INTRAVENOUS | Status: AC
  Administered 2023-12-26: 2150 [IU] via INTRAVENOUS
  Filled 2023-12-26: qty 2150

## 2023-12-26 MED ORDER — ALPRAZOLAM 0.25 MG PO TABS: 0.2500 mg | ORAL_TABLET | Freq: Two times a day (BID) | ORAL | Status: DC | PRN

## 2023-12-26 MED ORDER — PANTOPRAZOLE SODIUM 40 MG PO TBEC
40.0000 mg | DELAYED_RELEASE_TABLET | Freq: Every day | ORAL | Status: DC
  Administered 2023-12-26 – 2023-12-28 (×3): 40 mg via ORAL
  Filled 2023-12-26 (×3): qty 1

## 2023-12-26 MED ORDER — NITROGLYCERIN 0.4 MG SL SUBL
0.4000 mg | SUBLINGUAL_TABLET | SUBLINGUAL | Status: DC | PRN
  Administered 2023-12-26 (×2): 0.4 mg via SUBLINGUAL
  Filled 2023-12-26 (×2): qty 1

## 2023-12-26 MED ORDER — ISOSORBIDE MONONITRATE ER 30 MG PO TB24
30.0000 mg | ORAL_TABLET | Freq: Every day | ORAL | Status: DC
  Administered 2023-12-26 – 2023-12-28 (×3): 30 mg via ORAL
  Filled 2023-12-26 (×3): qty 1

## 2023-12-26 MED ORDER — SPIRONOLACTONE 12.5 MG HALF TABLET
12.5000 mg | ORAL_TABLET | Freq: Every day | ORAL | Status: DC
  Administered 2023-12-26 – 2023-12-28 (×3): 12.5 mg via ORAL
  Filled 2023-12-26 (×5): qty 1

## 2023-12-26 NOTE — Progress Notes (Signed)
 PHARMACY - ANTICOAGULATION CONSULT NOTE  Pharmacy Consult for Heparin  Indication: chest pain/ACS  Allergies  Allergen Reactions   Atorvastatin     Other reaction(s): Muscle Pain   Vicodin [Hydrocodone-Acetaminophen] Itching   Lactalbumin     Other reaction(s): Other (See Comments) Sneezing and post nasal drip   Milk-Related Compounds Other (See Comments)    Sneezing and post nasal drip    Patient Measurements: Height: 5\' 5"  (165.1 cm) Weight: 72.6 kg (160 lb) IBW/kg (Calculated) : 61.5 HEPARIN DW (KG): 72.6  Vital Signs: Temp: 98.1 F (36.7 C) (04/12 1742) Temp Source: Oral (04/12 1742) BP: 109/63 (04/12 1742) Pulse Rate: 63 (04/12 1742)  Labs: Recent Labs    12/26/23 0016 12/26/23 0158 12/26/23 1032 12/26/23 1858  HGB 12.2*  --   --   --   HCT 36.5*  --   --   --   PLT 283  --   --   --   HEPARINUNFRC  --   --  0.85* 0.19*  CREATININE 1.10  --   --   --   TROPONINIHS 10 11  --   --     Estimated Creatinine Clearance: 56.7 mL/min (by C-G formula based on SCr of 1.1 mg/dL).   Medical History: Past Medical History:  Diagnosis Date   Alcohol abuse    Arthritis    Chronic back pain    Chronic kidney disease    Complication of anesthesia    had difficulty with breathing, had difficulty waking up   Coronary artery disease    Diabetes mellitus without complication (HCC)    type 2   GERD (gastroesophageal reflux disease)    Gonorrhea    Headache(784.0)    History of kidney stones    Hypercholesteremia    no medication at this time   Hypertension    Trigger finger    on both hands   Ulcer    on medication   Medications:  (Not in a hospital admission)  Assessment: Pharmacy consulted to dose heparin in this 67 year old male admitted with ACS/NSTEMI.  No prior anticoag noted.  CrCl = 56.7 ml/min  0412 1032 HL 0.85   SUPRAtherapeutic 0412 1858 HL 0.19   SUBtherapeutic  Goal of Therapy:  Heparin level 0.3-0.7 units/ml Monitor platelets by  anticoagulation protocol: Yes   Plan:  No issues with infusion noted Give heparin bolus of 2150 units x1 Increase heparin infusion to 950 units/hr Check anti-Xa level in 6 hours and daily while on heparin Continue to monitor H&H and platelets  Thank you for involving pharmacy in this patient's care.   Ananias Balls, PharmD Clinical Pharmacist 12/26/2023 7:48 PM

## 2023-12-26 NOTE — Progress Notes (Signed)
 PHARMACY - ANTICOAGULATION CONSULT NOTE  Pharmacy Consult for Heparin  Indication: chest pain/ACS  Allergies  Allergen Reactions   Atorvastatin     Other reaction(s): Muscle Pain   Vicodin [Hydrocodone-Acetaminophen] Itching   Lactalbumin     Other reaction(s): Other (See Comments) Sneezing and post nasal drip   Milk-Related Compounds Other (See Comments)    Sneezing and post nasal drip    Patient Measurements: Height: 5\' 5"  (165.1 cm) Weight: 72.6 kg (160 lb) IBW/kg (Calculated) : 61.5 HEPARIN DW (KG): 72.6  Vital Signs: Temp: 97.9 F (36.6 C) (04/12 1043) Temp Source: Oral (04/12 1043) BP: 125/69 (04/12 1100) Pulse Rate: 64 (04/12 1100)  Labs: Recent Labs    12/26/23 0016 12/26/23 0158 12/26/23 1032  HGB 12.2*  --   --   HCT 36.5*  --   --   PLT 283  --   --   HEPARINUNFRC  --   --  0.85*  CREATININE 1.10  --   --   TROPONINIHS 10 11  --     Estimated Creatinine Clearance: 56.7 mL/min (by C-G formula based on SCr of 1.1 mg/dL).   Medical History: Past Medical History:  Diagnosis Date   Alcohol abuse    Arthritis    Chronic back pain    Chronic kidney disease    Complication of anesthesia    had difficulty with breathing, had difficulty waking up   Coronary artery disease    Diabetes mellitus without complication (HCC)    type 2   GERD (gastroesophageal reflux disease)    Gonorrhea    Headache(784.0)    History of kidney stones    Hypercholesteremia    no medication at this time   Hypertension    Trigger finger    on both hands   Ulcer    on medication    Medications:  (Not in a hospital admission)   Assessment: Pharmacy consulted to dose heparin in this 68 year old male admitted with ACS/NSTEMI.  No prior anticoag noted.  CrCl = 56.7 ml/min   0412 1032 HL 0.85   SUPRAtherapeutic   Goal of Therapy:  Heparin level 0.3-0.7 units/ml Monitor platelets by anticoagulation protocol: Yes   Plan:  Decrease heparin infusion to 750  units/hr Check anti-Xa level in 6 hours and daily while on heparin Continue to monitor H&H and platelets  Thomasine Flick PharmD Clinical Pharmacist 12/26/2023

## 2023-12-26 NOTE — ED Triage Notes (Signed)
 Pt reports left sided chest pain and shortness of breath. Pt reports he was watching TV when he started to feel like a "dull" pain Pt took one nitroglycerin prior to arrival reports it has helped with the pain some. Pt is scheduled for LHC on Monday. Hx of stents placed and triple bypass. Pt talks in complete sentences no respiratory distress noted

## 2023-12-26 NOTE — Assessment & Plan Note (Signed)
 Sliding scale insulin coverage

## 2023-12-26 NOTE — ED Provider Notes (Signed)
 [facility] Provider Note    Event Date/Time   First MD Initiated Contact with Patient 12/26/23 0031     (approximate)   History   Chest Pain   HPI  Tyler Russell is a 68 year old male with history of CAD presenting to the emergency department for evaluation of chest pain.  Around 11 PM, patient had onset of a dull chest pain over the left side of his chest.  Additionally reported discomfort in his bilateral arms.  Took a nitroglycerin with resolution.  Episode lasted for about 30 minutes.  Has additionally had lots of gas and belching.  Wife reports that this previously has been associated with cardiac events for the patient.  Currently chest pain-free.  Wife reports the patient has been having ongoing shortness of breath, but the chest pain he described tonight is new for him.  I reviewed his cardiology note from 12/14/2023.  At that time, patient was reporting shortness of breath as well as chest discomfort.  He is scheduled for a heart cath on Monday, but was instructed to present to the ER for any worsening symptoms.      Physical Exam   Triage Vital Signs: ED Triage Vitals  Encounter Vitals Group     BP 12/26/23 0008 (!) 148/76     Systolic BP Percentile --      Diastolic BP Percentile --      Pulse Rate 12/26/23 0008 63     Resp 12/26/23 0008 20     Temp 12/26/23 0008 (!) 97.5 F (36.4 C)     Temp Source 12/26/23 0008 Oral     SpO2 12/26/23 0008 100 %     Weight 12/26/23 0009 160 lb (72.6 kg)     Height 12/26/23 0009 5\' 5"  (1.651 m)     Head Circumference --      Peak Flow --      Pain Score 12/26/23 0009 2     Pain Loc --      Pain Education --      Exclude from Growth Chart --     Most recent vital signs: Vitals:   12/26/23 0008  BP: (!) 148/76  Pulse: 63  Resp: 20  Temp: (!) 97.5 F (36.4 C)  SpO2: 100%     General: Awake, interactive  CV:  Regular rate, good peripheral perfusion.  Resp:  Unlabored respirations, lungs  clear to auscultation Abd:  Nondistended, soft, nontender to palpation Neuro:  Symmetric facial movement, fluid speech   ED Results / Procedures / Treatments   Labs (all labs ordered are listed, but only abnormal results are displayed) Labs Reviewed  BASIC METABOLIC PANEL WITH GFR - Abnormal; Notable for the following components:      Result Value   Sodium 134 (*)    Glucose, Bld 177 (*)    All other components within normal limits  CBC - Abnormal; Notable for the following components:   Hemoglobin 12.2 (*)    HCT 36.5 (*)    All other components within normal limits  TROPONIN I (HIGH SENSITIVITY)  TROPONIN I (HIGH SENSITIVITY)     EKG EKG independently reviewed interpreted by myself (ER attending) demonstrates:  EKG demonstrates sinus rhythm rate of 67, PR 130, QRS 112, QTc 458, multiple ST changes noted but does not meet STEMI criteria  RADIOLOGY Imaging independently reviewed and interpreted by myself demonstrates:   CXR without focal consolidation  DG Chest 2 View Result Date: 12/26/2023 CLINICAL DATA:  Chest pain EXAM: CHEST - 2 VIEW COMPARISON:  None Available. FINDINGS: The heart size and mediastinal contours are within normal limits. Status post coronary artery bypass grafting. Both lungs are clear. The visualized skeletal structures are unremarkable. IMPRESSION: No active cardiopulmonary disease. Electronically Signed   By: Worthy Heads M.D.   On: 12/26/2023 00:35    PROCEDURES:  Critical Care performed: No  Procedures   MEDICATIONS ORDERED IN ED: Medications  nitroGLYCERIN (NITROSTAT) SL tablet 0.4 mg (0.4 mg Sublingual Given 12/26/23 0154)  aspirin tablet 325 mg (325 mg Oral Given 12/26/23 0154)     IMPRESSION / MDM / ASSESSMENT AND PLAN / ED COURSE  I reviewed the triage vital signs and the nursing notes.  Differential diagnosis includes, but is not limited to, ACS, unstable angina, anemia, pneumonia, pneumothorax  Patient's presentation is most  consistent with acute presentation with potential threat to life or bodily function.  68 year old male presenting with chest pain.  Stable vitals on presentation.  Labs reassuring including negative troponin x 2.  EKG does demonstrate ischemic changes without evidence of STEMI.  He is planned for outpatient catheterization early next week, but in light of worsening symptoms, do think he is appropriate for admission for consideration of earlier intervention.  Will reach out to hospitalist team.  Case discussed with Dr. Vallarie Gauze. She will evaluate for anticipated admission.       FINAL CLINICAL IMPRESSION(S) / ED DIAGNOSES   Final diagnoses:  Acute chest pain     Rx / DC Orders   ED Discharge Orders     None        Note:  This document was prepared using Dragon voice recognition software and may include unintentional dictation errors.   Claria Crofts, MD 12/26/23 [phone]

## 2023-12-26 NOTE — ED Notes (Signed)
Pharmacy messaged in regard to missing medication.

## 2023-12-26 NOTE — Consult Note (Signed)
 Tyler Russell is a 68 y.o. male  [id number]  Primary Cardiologist: [facility] cardiology Reason for Consultation: Unstable angina  HPI: This is a 68 year old white male with a past medical history of CABG 2017 and PCI with stenting November 2024 of SVG to diagonal presented to the hospital with chest pain.  Patient was admitted in November 2020 for with chest pain and had non-STEMI and underwent cardiac catheterization at that time.  He had a high-grade lesion in SVG to diagonal which was treated with PCI and stenting.  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  It was decided to treat that medically and in the future consider high risk intervention at [facility].  LIMA to the LAD was patent.  Patient now presents with chest pain described as pressure type associated with shortness of breath which is gradually getting better.   Review of Systems: No orthopnea PND or leg swelling   Past Medical History:  Diagnosis Date   Alcohol abuse    Arthritis    Chronic back pain    Chronic kidney disease    Complication of anesthesia    had difficulty with breathing, had difficulty waking up   Coronary artery disease    Diabetes mellitus without complication (HCC)    type 2   GERD (gastroesophageal reflux disease)    Gonorrhea    Headache(784.0)    History of kidney stones    Hypercholesteremia    no medication at this time   Hypertension    Trigger finger    on both hands   Ulcer    on medication    (Not in a hospital admission)     [START ON 12/27/2023] aspirin EC  81 mg Oral Daily   clopidogrel  75 mg Oral Daily   escitalopram  20 mg Oral Daily   insulin aspart  0-15 Units Subcutaneous TID WC   insulin aspart  0-5 Units Subcutaneous QHS   isosorbide mononitrate  30 mg Oral Daily   metoprolol succinate  100 mg Oral Daily   pantoprazole  40 mg Oral Daily   rosuvastatin  40 mg Oral Daily   spironolactone  12.5 mg  Oral Daily    Infusions:  heparin 750 Units/hr (12/26/23 1222)    Allergies  Allergen Reactions   Atorvastatin     Other reaction(s): Muscle Pain   Vicodin [Hydrocodone-Acetaminophen] Itching   Lactalbumin     Other reaction(s): Other (See Comments) Sneezing and post nasal drip   Milk-Related Compounds Other (See Comments)    Sneezing and post nasal drip    Social History   Socioeconomic History   Marital status: Married    Spouse name: Not on file   Number of children: Not on file   Years of education: Not on file   Highest education level: Not on file  Occupational History   Not on file  Tobacco Use   Smoking status: Former    Types: Cigarettes   Smokeless tobacco: Never   Tobacco comments:    stopped at age 51 years  Vaping Use   Vaping status: Never Used  Substance and Sexual Activity   Alcohol use: Yes    Comment: occassional   Drug use: No   Sexual activity: Yes  Other Topics Concern   Not on file  Social History Narrative   Independent at baseline, lives with family   Social Drivers of Dispensing optician  Resource Strain: Not on file  Food Insecurity: No Food Insecurity (07/17/2023)   Hunger Vital Sign    Worried About Running Out of Food in the Last Year: Never true    Ran Out of Food in the Last Year: Never true  Transportation Needs: No Transportation Needs (07/17/2023)   PRAPARE - Administrator, Civil Service (Medical): No    Lack of Transportation (Non-Medical): No  Physical Activity: Not on file  Stress: Not on file  Social Connections: Not on file  Intimate Partner Violence: Not At Risk (07/17/2023)   Humiliation, Afraid, Rape, and Kick questionnaire    Fear of Current or Ex-Partner: No    Emotionally Abused: No    Physically Abused: No    Sexually Abused: No    Family History  Problem Relation Age of Onset   CAD Father    Stomach cancer Maternal Grandmother     PHYSICAL EXAM: Vitals:   12/26/23 1043 12/26/23 1100   BP: 131/70 125/69  Pulse: 75 64  Resp: 16 12  Temp: 97.9 F (36.6 C)   SpO2: 97% 97%    No intake or output data in the 24 hours ending 12/26/23 1241  General:  Well appearing. No respiratory difficulty HEENT: normal Neck: supple. no JVD. Carotids 2+ bilat; no bruits. No lymphadenopathy or thryomegaly appreciated. Cor: PMI nondisplaced. Regular rate & rhythm. No rubs, gallops or murmurs. Lungs: clear Abdomen: soft, nontender, nondistended. No hepatosplenomegaly. No bruits or masses. Good bowel sounds. Extremities: no cyanosis, clubbing, rash, edema Neuro: alert & oriented x 3, cranial nerves grossly intact. moves all 4 extremities w/o difficulty. Affect pleasant.  ECG: Normal sinus rhythm about 60 bpm with LVH and T wave inversion in the inferolateral leads suggestive of ischemia.  Old anteroseptal wall MI.  Results for orders placed or performed during the hospital encounter of 12/26/23 (from the past 24 hours)  Basic metabolic panel     Status: Abnormal   Collection Time: 12/26/23 12:16 AM  Result Value Ref Range   Sodium 134 (L) 135 - 145 mmol/L   Potassium 3.7 3.5 - 5.1 mmol/L   Chloride 101 98 - 111 mmol/L   CO2 24 22 - 32 mmol/L   Glucose, Bld 177 (H) 70 - 99 mg/dL   BUN 19 8 - 23 mg/dL   Creatinine, Ser 0.98 0.61 - 1.24 mg/dL   Calcium 9.5 8.9 - 11.9 mg/dL   GFR, Estimated >14 >78 mL/min   Anion gap 9 5 - 15  CBC     Status: Abnormal   Collection Time: 12/26/23 12:16 AM  Result Value Ref Range   WBC 8.6 4.0 - 10.5 K/uL   RBC 4.39 4.22 - 5.81 MIL/uL   Hemoglobin 12.2 (L) 13.0 - 17.0 g/dL   HCT 29.5 (L) 62.1 - 30.8 %   MCV 83.1 80.0 - 100.0 fL   MCH 27.8 26.0 - 34.0 pg   MCHC 33.4 30.0 - 36.0 g/dL   RDW 65.7 84.6 - 96.2 %   Platelets 283 150 - 400 K/uL   nRBC 0.0 0.0 - 0.2 %  Troponin I (High Sensitivity)     Status: None   Collection Time: 12/26/23 12:16 AM  Result Value Ref Range   Troponin I (High Sensitivity) 10 <18 ng/L  Troponin I (High Sensitivity)      Status: None   Collection Time: 12/26/23  1:58 AM  Result Value Ref Range   Troponin I (High Sensitivity) 11 <18 ng/L  CBG monitoring, ED     Status: Abnormal   Collection Time: 12/26/23  7:41 AM  Result Value Ref Range   Glucose-Capillary 183 (H) 70 - 99 mg/dL  Heparin level (unfractionated)     Status: Abnormal   Collection Time: 12/26/23 10:32 AM  Result Value Ref Range   Heparin Unfractionated 0.85 (H) 0.30 - 0.70 IU/mL  CBG monitoring, ED     Status: Abnormal   Collection Time: 12/26/23 12:20 PM  Result Value Ref Range   Glucose-Capillary 161 (H) 70 - 99 mg/dL   DG Chest 2 View Result Date: 12/26/2023 CLINICAL DATA:  Chest pain EXAM: CHEST - 2 VIEW COMPARISON:  None Available. FINDINGS: The heart size and mediastinal contours are within normal limits. Status post coronary artery bypass grafting. Both lungs are clear. The visualized skeletal structures are unremarkable. IMPRESSION: No active cardiopulmonary disease. Electronically Signed   By: Worthy Heads M.D.   On: 12/26/2023 00:35     ASSESSMENT AND PLAN: Chest pain at rest with flat troponin but has some ischemic changes in the inferolateral leads on EKG.  Patient was started on IV heparin and nitroglycerin and has significantly improved his symptoms.  By the time I saw the patient he was no longer having chest pain.  Will observe the patient and get a echocardiogram.  He has history of having untreated unprotected left circumflex with diffuse calcified disease.  On Monday may consider PCI at [facility] or transferred to [facility] for this procedure.  Will observe the patient with you over the weekend. #  Debborah Fairly

## 2023-12-26 NOTE — Plan of Care (Signed)
 Patient was seen and examined at bedside in the ED.  Patient was admitted after midnight due to chest pain, unstable angina.  Troponin flat, negative.  Patient was started on heparin IV infusion.  Currently patient is chest pain-free.  We will continue current treatment.  Seen by cardiology, recommended to observe over the weekend and on Monday patient may need cardiac cath at [facility] versus transfer to [facility] for PCI due to high risk.

## 2023-12-26 NOTE — Assessment & Plan Note (Signed)
 Continue rosuvastatin.

## 2023-12-26 NOTE — Assessment & Plan Note (Addendum)
 CAD s/p CABG times 12/03/2015, s/p stent 07/2023 Patient with recurrent chest pain and dyspnea on exertion with plans for cath on 4/14 Troponins negative and ST depressions seen on EKG Received full dose aspirin Continue DAPT, rosuvastatin, metoprolol Nitroglycerin sublingual as needed chest pain with morphine for breakthrough Start heparin infusion without bolus due to unstable angina Cardiology consult

## 2023-12-26 NOTE — ED Notes (Signed)
 Pt given meal tray. Family at beside

## 2023-12-26 NOTE — H&P (Signed)
 History and Physical    Patient: Tyler Russell ZOX:[id number] DOB: 02-29-1956 DOA: 12/26/2023 DOS: the patient was seen and examined on 12/26/2023 PCP: [facility]  Patient coming from: Home  Chief Complaint:  Chief Complaint  Patient presents with   Chest Pain    HPI: Tyler Russell is a 68 y.o. male with medical history significant for CAD status post CABG(2017), PCI with stent 07/2023 currently on DAPT, HTN, HLD, IIDM, anxiety/depression being admitted with unstable angina/high risk chest pain.  Patient was last seen by his cardiologist on 3/31 with a complaint of shortness of breath and intermittent chest discomfort that was worse on the day of his appointment.  He is scheduled for repeat cardiac cath in 2 days on 4/14.  His symptoms have continued in spite of optimization of medical therapy.  On the night of admission he had an episode of chest pain that was more intense lasting for 30 minutes, relieved with nitroglycerin and was chest pain-free by arrival.  His shortness of breath is ongoing and no worse.  He denies cough fever or chills. ED course and data review: Vitals unremarkable and troponin 10-->11 EKG with NSR at 67 with ST depression lateral leads Patient treated with aspirin 325, sublingual nitroglycerin x 2 Hospitalist consulted for admission   Review of Systems: As mentioned in the history of present illness. All other systems reviewed and are negative.  Past Medical History:  Diagnosis Date   Alcohol abuse    Arthritis    Chronic back pain    Chronic kidney disease    Complication of anesthesia    had difficulty with breathing, had difficulty waking up   Coronary artery disease    Diabetes mellitus without complication (HCC)    type 2   GERD (gastroesophageal reflux disease)    Gonorrhea    Headache(784.0)    History of kidney stones    Hypercholesteremia    no medication at this time   Hypertension    Trigger finger    on both hands   Ulcer     on medication   Past Surgical History:  Procedure Laterality Date   ANTERIOR LAT LUMBAR FUSION  08/28/2011   Procedure: ANTERIOR LATERAL LUMBAR FUSION 1 LEVEL;  Surgeon: Marca Senna;  Location: [facility] OR;  Service: Orthopedics;  Laterality: N/A;  Lateral L3-4 Fusion Posterior Spinal Fusion L3-4, Possible Removal of Hardware    CARDIAC CATHETERIZATION N/A 02/27/2016   Procedure: Left Heart Cath and Coronary Angiography;  Surgeon: Michelle Aid, MD;  Location: [facility] INVASIVE CV LAB;  Service: Cardiovascular;  Laterality: N/A;   CARPAL TUNNEL RELEASE Left 05/04/2019   CARPAL TUNNEL RELEASE Right 02/29/2020   CERVICAL DISC SURGERY     3 ruptured disc, 2012   COLONOSCOPY  2017   CORONARY ARTERY BYPASS GRAFT N/A 02/29/2016   Procedure: CORONARY ARTERY BYPASS GRAFTING (CABG), ON PUMP, TIMES THREE, USING LEFT INTERNAL MAMMARY ARTERY, RIGHT GREATER SAPHENOUS VEIN HARVESTED ENDOSCOPICALLY;  Surgeon: Bartley Lightning, MD;  Location: [facility] OR;  Service: Open Heart Surgery;  Laterality: N/A;  LIMA to LAD, SVG to RAMUS INTERMEDIATE, SVG to OM   CORONARY STENT INTERVENTION N/A 07/17/2023   Procedure: CORONARY STENT INTERVENTION;  Surgeon: Antonette Batters, MD;  Location: [facility] INVASIVE CV LAB;  Service: Cardiovascular;  Laterality: N/A;   ENDARTERECTOMY Left 01/28/2022   Procedure: ENDARTERECTOMY CAROTID;  Surgeon: Celso College, MD;  Location: [facility] ORS;  Service: Vascular;  Laterality: Left;   ESOPHAGOGASTRODUODENOSCOPY (EGD)  WITH PROPOFOL N/A 11/23/2015   Procedure: ESOPHAGOGASTRODUODENOSCOPY (EGD) WITH PROPOFOL;  Surgeon: Deveron Fly, MD;  Location: [facility] ENDOSCOPY;  Service: Endoscopy;  Laterality: N/A;   KNEE ARTHROSCOPY Left    LEFT HEART CATH AND CORONARY ANGIOGRAPHY N/A 07/17/2023   Procedure: LEFT HEART CATH AND CORONARY ANGIOGRAPHY  possible  PCI and stent;  Surgeon: Antonette Batters, MD;  Location: [facility] INVASIVE CV LAB;  Service: Cardiovascular;  Laterality: N/A;   LUMBAR FUSION  2008    SHOULDER ARTHROSCOPY WITH SUBACROMIAL DECOMPRESSION, ROTATOR CUFF REPAIR AND BICEP TENDON REPAIR Left 05/31/2021   Procedure: Left shoulder arthroscopic subscapularis repair, mini-open rotator cuff repair, subacromial decompression, and biceps tenodesis;  Surgeon: Lorri Rota, MD;  Location: [facility];  Service: Orthopedics;  Laterality: Left;   TEE WITHOUT CARDIOVERSION N/A 02/29/2016   Procedure: TRANSESOPHAGEAL ECHOCARDIOGRAM (TEE);  Surgeon: Bartley Lightning, MD;  Location: [facility] OR;  Service: Open Heart Surgery;  Laterality: N/A;   VASECTOMY  09/15/1981   Social History:  reports that he has quit smoking. His smoking use included cigarettes. He has never used smokeless tobacco. He reports current alcohol use. He reports that he does not use drugs.  Allergies  Allergen Reactions   Atorvastatin     Other reaction(s): Muscle Pain   Vicodin [Hydrocodone-Acetaminophen] Itching   Lactalbumin     Other reaction(s): Other (See Comments) Sneezing and post nasal drip   Milk-Related Compounds Other (See Comments)    Sneezing and post nasal drip    Family History  Problem Relation Age of Onset   CAD Father    Stomach cancer Maternal Grandmother     Prior to Admission medications   Medication Sig Start Date End Date Taking? Authorizing Provider  aspirin EC 81 MG tablet Take 1 tablet (81 mg total) by mouth daily. 07/18/23   Alexander, Natalie, DO  blood glucose meter kit and supplies Dispense based on patient and insurance preference. Use up to four times daily as directed. (FOR ICD-9 250.00, 250.01). 03/04/16   Barrett, Erin R, PA-C  cetirizine (ZYRTEC) 10 MG tablet Take 1 tablet (10 mg total) by mouth daily. 07/18/23   Alexander, Natalie, DO  clopidogrel (PLAVIX) 75 MG tablet Take 75 mg by mouth daily.    [provider]  diphenhydrAMINE (BENADRYL) 25 MG tablet Take 25 mg by mouth every 6 (six) hours as needed.    [provider]  escitalopram (LEXAPRO) 20 MG tablet Take  20 mg by mouth daily.    [provider]  hydrALAZINE (APRESOLINE) 25 MG tablet Take 1 tablet (25 mg total) by mouth every 12 (twelve) hours. 07/18/23   Callwood, Dwayne D, MD  isosorbide mononitrate (IMDUR) 30 MG 24 hr tablet Take 1 tablet (30 mg total) by mouth daily. 07/18/23   Callwood, Dwayne D, MD  losartan-hydrochlorothiazide (HYZAAR) 100-12.5 MG tablet Take 1 tablet by mouth daily. 04/30/23   [provider]  metFORMIN (GLUCOPHAGE-XR) 500 MG 24 hr tablet Take 500 mg by mouth 2 (two) times daily.    [provider]  metoprolol succinate (TOPROL-XL) 100 MG 24 hr tablet Take 100 mg by mouth daily.    [provider]  pantoprazole (PROTONIX) 40 MG tablet Take 40 mg by mouth daily.    [provider]  rosuvastatin (CRESTOR) 40 MG tablet Take 1 tablet (40 mg total) by mouth daily. 07/18/23 08/17/23  Alexander, Natalie, DO  spironolactone (ALDACTONE) 25 MG tablet Take 0.5 tablets (12.5 mg total) by mouth daily. 07/19/23  Burney Carter D, MD    Physical Exam: Vitals:   12/26/23 0008 12/26/23 0009 12/26/23 0300 12/26/23 0318  BP: (!) 148/76  (!) 159/85   Pulse: 63  72 76  Resp: 20  14 17   Temp: (!) 97.5 F (36.4 C)     TempSrc: Oral     SpO2: 100%  98% 100%  Weight:  72.6 kg    Height:  5\' 5"  (1.651 m)     Physical Exam Vitals and nursing note reviewed.  Constitutional:      General: He is not in acute distress. HENT:     Head: Normocephalic and atraumatic.  Cardiovascular:     Rate and Rhythm: Normal rate and regular rhythm.     Heart sounds: Normal heart sounds.  Pulmonary:     Effort: Pulmonary effort is normal.     Breath sounds: Normal breath sounds.  Abdominal:     Palpations: Abdomen is soft.     Tenderness: There is no abdominal tenderness.  Neurological:     Mental Status: Mental status is at baseline.     Labs on Admission: I have personally reviewed following labs and imaging studies  CBC: Recent Labs  Lab  12/26/23 0016  WBC 8.6  HGB 12.2*  HCT 36.5*  MCV 83.1  PLT 283   Basic Metabolic Panel: Recent Labs  Lab 12/26/23 0016  NA 134*  K 3.7  CL 101  CO2 24  GLUCOSE 177*  BUN 19  CREATININE 1.10  CALCIUM 9.5   GFR: Estimated Creatinine Clearance: 56.7 mL/min (by C-G formula based on SCr of 1.1 mg/dL). Liver Function Tests: No results for input(s): "AST", "ALT", "ALKPHOS", "BILITOT", "PROT", "ALBUMIN" in the last 168 hours. No results for input(s): "LIPASE", "AMYLASE" in the last 168 hours. No results for input(s): "AMMONIA" in the last 168 hours. Coagulation Profile: No results for input(s): "INR", "PROTIME" in the last 168 hours. Cardiac Enzymes: No results for input(s): "CKTOTAL", "CKMB", "CKMBINDEX", "TROPONINI" in the last 168 hours. BNP (last 3 results) No results for input(s): "PROBNP" in the last 8760 hours. HbA1C: No results for input(s): "HGBA1C" in the last 72 hours. CBG: No results for input(s): "GLUCAP" in the last 168 hours. Lipid Profile: No results for input(s): "CHOL", "HDL", "LDLCALC", "TRIG", "CHOLHDL", "LDLDIRECT" in the last 72 hours. Thyroid Function Tests: No results for input(s): "TSH", "T4TOTAL", "FREET4", "T3FREE", "THYROIDAB" in the last 72 hours. Anemia Panel: No results for input(s): "VITAMINB12", "FOLATE", "FERRITIN", "TIBC", "IRON", "RETICCTPCT" in the last 72 hours. Urine analysis:    Component Value Date/Time   COLORURINE YELLOW (A) 01/26/2022 0834   APPEARANCEUR CLEAR (A) 01/26/2022 0834   APPEARANCEUR Clear 12/03/2017 1445   LABSPEC 1.018 01/26/2022 0834   PHURINE 7.0 01/26/2022 0834   GLUCOSEU NEGATIVE 01/26/2022 0834   HGBUR NEGATIVE 01/26/2022 0834   BILIRUBINUR NEGATIVE 01/26/2022 0834   BILIRUBINUR Negative 12/03/2017 1445   KETONESUR NEGATIVE 01/26/2022 0834   PROTEINUR NEGATIVE 01/26/2022 0834   UROBILINOGEN 2.0 (H) 09/03/2011 1358   NITRITE NEGATIVE 01/26/2022 0834   LEUKOCYTESUR NEGATIVE 01/26/2022 0834     Radiological Exams on Admission: DG Chest 2 View Result Date: 12/26/2023 CLINICAL DATA:  Chest pain EXAM: CHEST - 2 VIEW COMPARISON:  None Available. FINDINGS: The heart size and mediastinal contours are within normal limits. Status post coronary artery bypass grafting. Both lungs are clear. The visualized skeletal structures are unremarkable. IMPRESSION: No active cardiopulmonary disease. Electronically Signed   By: Worthy Heads M.D.   On:  12/26/2023 00:35     Data Reviewed: Relevant notes from primary care and specialist visits, past discharge summaries as available in EHR, including Care Everywhere. Prior diagnostic testing as pertinent to current admission diagnoses Updated medications and problem lists for reconciliation ED course, including vitals, labs, imaging, treatment and response to treatment Triage notes, nursing and pharmacy notes and ED provider's notes Notable results as noted in HPI   Assessment and Plan: * Unstable angina (HCC) CAD s/p CABG times 12/03/2015, s/p stent 07/2023 Patient with recurrent chest pain and dyspnea on exertion with plans for cath on 4/14 Troponins negative and ST depressions seen on EKG Received full dose aspirin Continue DAPT, rosuvastatin, metoprolol Nitroglycerin sublingual as needed chest pain with morphine for breakthrough Start heparin infusion without bolus due to unstable angina Cardiology consult  DM2 (diabetes mellitus, type 2) (HCC) Sliding scale insulin coverage  Generalized anxiety disorder Continue Lexapro  Mixed hyperlipidemia Continue rosuvastatin  Essential hypertension Continue home meds        DVT prophylaxis: Heparin  Consults: cardiology Wilma Has  Advance Care Planning:   Code Status: Prior   Family Communication: none  Disposition Plan: Back to previous home environment  Severity of Illness: The appropriate patient status for this patient is OBSERVATION. Observation status is judged to be  reasonable and necessary in order to provide the required intensity of service to ensure the patient's safety. The patient's presenting symptoms, physical exam findings, and initial radiographic and laboratory data in the context of their medical condition is felt to place them at decreased risk for further clinical deterioration. Furthermore, it is anticipated that the patient will be medically stable for discharge from the hospital within 2 midnights of admission.   Author: Lanetta Pion, MD 12/26/2023 3:53 AM  For on call review [email or web address].

## 2023-12-26 NOTE — Assessment & Plan Note (Signed)
-  Continue Lexapro

## 2023-12-26 NOTE — Assessment & Plan Note (Signed)
 Continue home meds

## 2023-12-26 NOTE — Progress Notes (Addendum)
 PHARMACY - ANTICOAGULATION CONSULT NOTE  Pharmacy Consult for Heparin  Indication: chest pain/ACS  Allergies  Allergen Reactions   Atorvastatin     Other reaction(s): Muscle Pain   Vicodin [Hydrocodone-Acetaminophen] Itching   Lactalbumin     Other reaction(s): Other (See Comments) Sneezing and post nasal drip   Milk-Related Compounds Other (See Comments)    Sneezing and post nasal drip    Patient Measurements: Height: 5\' 5"  (165.1 cm) Weight: 72.6 kg (160 lb) IBW/kg (Calculated) : 61.5 HEPARIN DW (KG): 72.6  Vital Signs: Temp: 97.5 F (36.4 C) (04/12 0008) Temp Source: Oral (04/12 0008) BP: 130/61 (04/12 0400) Pulse Rate: 67 (04/12 0400)  Labs: Recent Labs    12/26/23 0016 12/26/23 0158  HGB 12.2*  --   HCT 36.5*  --   PLT 283  --   CREATININE 1.10  --   TROPONINIHS 10 11    Estimated Creatinine Clearance: 56.7 mL/min (by C-G formula based on SCr of 1.1 mg/dL).   Medical History: Past Medical History:  Diagnosis Date   Alcohol abuse    Arthritis    Chronic back pain    Chronic kidney disease    Complication of anesthesia    had difficulty with breathing, had difficulty waking up   Coronary artery disease    Diabetes mellitus without complication (HCC)    type 2   GERD (gastroesophageal reflux disease)    Gonorrhea    Headache(784.0)    History of kidney stones    Hypercholesteremia    no medication at this time   Hypertension    Trigger finger    on both hands   Ulcer    on medication    Medications:  (Not in a hospital admission)   Assessment: Pharmacy consulted to dose heparin in this 68 year old male admitted with ACS/NSTEMI.  No prior anticoag noted.  CrCl = 56.7 ml/min  Goal of Therapy:  Heparin level 0.3-0.7 units/ml Monitor platelets by anticoagulation protocol: Yes   Plan:  No initial bolus but ok to follow nomogram after - per MD request Start heparin infusion at 900 units/hr Check anti-Xa level in 6 hours and daily while  on heparin Continue to monitor H&H and platelets  Patti Shorb D 12/26/2023,4:05 AM

## 2023-12-27 DIAGNOSIS — F411 Generalized anxiety disorder: Secondary | ICD-10-CM | POA: Diagnosis present

## 2023-12-27 DIAGNOSIS — E119 Type 2 diabetes mellitus without complications: Secondary | ICD-10-CM | POA: Diagnosis present

## 2023-12-27 DIAGNOSIS — Z87891 Personal history of nicotine dependence: Secondary | ICD-10-CM | POA: Diagnosis not present

## 2023-12-27 DIAGNOSIS — E782 Mixed hyperlipidemia: Secondary | ICD-10-CM | POA: Diagnosis present

## 2023-12-27 DIAGNOSIS — I252 Old myocardial infarction: Secondary | ICD-10-CM | POA: Diagnosis not present

## 2023-12-27 DIAGNOSIS — K219 Gastro-esophageal reflux disease without esophagitis: Secondary | ICD-10-CM | POA: Diagnosis present

## 2023-12-27 DIAGNOSIS — Z955 Presence of coronary angioplasty implant and graft: Secondary | ICD-10-CM | POA: Diagnosis not present

## 2023-12-27 DIAGNOSIS — Z79899 Other long term (current) drug therapy: Secondary | ICD-10-CM | POA: Diagnosis not present

## 2023-12-27 DIAGNOSIS — Z8249 Family history of ischemic heart disease and other diseases of the circulatory system: Secondary | ICD-10-CM | POA: Diagnosis not present

## 2023-12-27 DIAGNOSIS — Z7984 Long term (current) use of oral hypoglycemic drugs: Secondary | ICD-10-CM | POA: Diagnosis not present

## 2023-12-27 DIAGNOSIS — I25110 Atherosclerotic heart disease of native coronary artery with unstable angina pectoris: Secondary | ICD-10-CM | POA: Diagnosis present

## 2023-12-27 DIAGNOSIS — Z888 Allergy status to other drugs, medicaments and biological substances status: Secondary | ICD-10-CM | POA: Diagnosis not present

## 2023-12-27 DIAGNOSIS — Z7982 Long term (current) use of aspirin: Secondary | ICD-10-CM | POA: Diagnosis not present

## 2023-12-27 DIAGNOSIS — Z800 Family history of malignant neoplasm of digestive organs: Secondary | ICD-10-CM | POA: Diagnosis not present

## 2023-12-27 DIAGNOSIS — Z7902 Long term (current) use of antithrombotics/antiplatelets: Secondary | ICD-10-CM | POA: Diagnosis not present

## 2023-12-27 DIAGNOSIS — Z951 Presence of aortocoronary bypass graft: Secondary | ICD-10-CM | POA: Diagnosis not present

## 2023-12-27 DIAGNOSIS — I214 Non-ST elevation (NSTEMI) myocardial infarction: Secondary | ICD-10-CM | POA: Diagnosis not present

## 2023-12-27 DIAGNOSIS — Z981 Arthrodesis status: Secondary | ICD-10-CM | POA: Diagnosis not present

## 2023-12-27 DIAGNOSIS — I2510 Atherosclerotic heart disease of native coronary artery without angina pectoris: Secondary | ICD-10-CM | POA: Diagnosis not present

## 2023-12-27 DIAGNOSIS — Z91011 Allergy to milk products: Secondary | ICD-10-CM | POA: Diagnosis not present

## 2023-12-27 DIAGNOSIS — Z885 Allergy status to narcotic agent status: Secondary | ICD-10-CM | POA: Diagnosis not present

## 2023-12-27 DIAGNOSIS — I10 Essential (primary) hypertension: Secondary | ICD-10-CM | POA: Diagnosis not present

## 2023-12-27 DIAGNOSIS — I200 Unstable angina: Secondary | ICD-10-CM | POA: Diagnosis present

## 2023-12-27 LAB — CBC
HCT: 36.6 % — ABNORMAL LOW (ref 39.0–52.0)
Hemoglobin: 12.3 g/dL — ABNORMAL LOW (ref 13.0–17.0)
MCH: 27.7 pg (ref 26.0–34.0)
MCHC: 33.6 g/dL (ref 30.0–36.0)
MCV: 82.4 fL (ref 80.0–100.0)
Platelets: 268 10*3/uL (ref 150–400)
RBC: 4.44 MIL/uL (ref 4.22–5.81)
RDW: 14.2 % (ref 11.5–15.5)
WBC: 8.1 10*3/uL (ref 4.0–10.5)
nRBC: 0 % (ref 0.0–0.2)

## 2023-12-27 LAB — BASIC METABOLIC PANEL WITH GFR
Anion gap: 9 (ref 5–15)
BUN: 17 mg/dL (ref 8–23)
CO2: 24 mmol/L (ref 22–32)
Calcium: 9.2 mg/dL (ref 8.9–10.3)
Chloride: 104 mmol/L (ref 98–111)
Creatinine, Ser: 0.9 mg/dL (ref 0.61–1.24)
GFR, Estimated: >60 mL/min (ref >60)
Glucose, Bld: 157 mg/dL — ABNORMAL HIGH (ref 70–99)
Potassium: 4.4 mmol/L (ref 3.5–5.1)
Sodium: 137 mmol/L (ref 135–145)

## 2023-12-27 LAB — GLUCOSE, CAPILLARY
Glucose-Capillary: 160 mg/dL — ABNORMAL HIGH (ref 70–99)
Glucose-Capillary: 117 mg/dL — ABNORMAL HIGH (ref 70–99)
Glucose-Capillary: 138 mg/dL — ABNORMAL HIGH (ref 70–99)
Glucose-Capillary: 120 mg/dL — ABNORMAL HIGH (ref 70–99)

## 2023-12-27 LAB — HEPARIN LEVEL (UNFRACTIONATED)
Heparin Unfractionated: 0.75 [IU]/mL — ABNORMAL HIGH (ref 0.30–0.70)
Heparin Unfractionated: 0.59 [IU]/mL (ref 0.30–0.70)
Heparin Unfractionated: 0.52 [IU]/mL (ref 0.30–0.70)

## 2023-12-27 LAB — MAGNESIUM: Magnesium: 2.1 mg/dL (ref 1.7–2.4)

## 2023-12-27 LAB — PHOSPHORUS: Phosphorus: 3.7 mg/dL (ref 2.5–4.6)

## 2023-12-27 MED ORDER — ASPIRIN 81 MG PO TBEC: 81.0000 mg | DELAYED_RELEASE_TABLET | Freq: Every day | ORAL | Status: DC

## 2023-12-27 MED ORDER — ASPIRIN 81 MG PO CHEW: 81.0000 mg | CHEWABLE_TABLET | ORAL | Status: AC

## 2023-12-27 MED ORDER — SODIUM CHLORIDE 0.9 % WEIGHT BASED INFUSION
1.0000 mL/kg/h | INTRAVENOUS | Status: DC
  Administered 2023-12-28: 1 mL/kg/h via INTRAVENOUS

## 2023-12-27 MED ORDER — ASPIRIN 81 MG PO CHEW: 81.0000 mg | CHEWABLE_TABLET | ORAL | Status: DC

## 2023-12-27 MED ORDER — SODIUM CHLORIDE 0.9 % IV SOLN: 250.0000 mL | INTRAVENOUS | Status: DC | PRN

## 2023-12-27 MED ORDER — SODIUM CHLORIDE 0.9% FLUSH: 3.0000 mL | Freq: Two times a day (BID) | INTRAVENOUS | Status: DC

## 2023-12-27 MED ORDER — SODIUM CHLORIDE 0.9% FLUSH: 3.0000 mL | INTRAVENOUS | Status: DC | PRN

## 2023-12-27 MED ORDER — ORAL CARE MOUTH RINSE: 15.0000 mL | OROMUCOSAL | Status: DC | PRN

## 2023-12-27 MED ORDER — SODIUM CHLORIDE 0.9 % IV SOLN: INTRAVENOUS | Status: DC

## 2023-12-27 MED ORDER — POLYVINYL ALCOHOL 1.4 % OP SOLN
1.0000 [drp] | OPHTHALMIC | Status: DC | PRN
  Administered 2023-12-27: 1 [drp] via OPHTHALMIC
  Filled 2023-12-27: qty 15

## 2023-12-27 MED ORDER — ASPIRIN 81 MG PO CHEW
81.0000 mg | CHEWABLE_TABLET | ORAL | Status: AC
  Administered 2023-12-28: 81 mg via ORAL
  Filled 2023-12-27: qty 1

## 2023-12-27 NOTE — Progress Notes (Signed)
 [facility] Progress Note  Patient: Tyler Russell    WUJ:[id number]  DOA: 12/26/2023     Date of Service: the patient was seen and examined on 12/27/2023  Chief Complaint  Patient presents with   Chest Pain   Brief hospital course:  Conlee L Vankuren is a 68 y.o. male with medical history significant for CAD status post CABG(2017), PCI with stent 07/2023 currently on DAPT, HTN, HLD, IIDM, anxiety/depression being admitted with unstable angina/high risk chest pain.  Patient was last seen by his cardiologist on 3/31 with a complaint of shortness of breath and intermittent chest discomfort that was worse on the day of his appointment.  He is scheduled for repeat cardiac cath in 2 days on 4/14.  His symptoms have continued in spite of optimization of medical therapy.  On the night of admission he had an episode of chest pain that was more intense lasting for 30 minutes, relieved with nitroglycerin and was chest pain-free by arrival.  His shortness of breath is ongoing and no worse.  He denies cough fever or chills. ED course and data review: Vitals unremarkable and troponin 10-->11 EKG with NSR at 67 with ST depression lateral leads Patient treated with aspirin 325, sublingual nitroglycerin x 2 Hospitalist consulted for admission    Assessment and Plan:   # Unstable angina (HCC) CAD s/p CABG times 12/03/2015, s/p stent 07/2023 Patient with recurrent chest pain and dyspnea on exertion with plans for cath on 4/14 Troponins negative and ST depressions seen on EKG Received full dose aspirin Continue DAPT, rosuvastatin, metoprolol Nitroglycerin sublingual as needed chest pain with morphine for breakthrough Continue heparin infusion without bolus due to unstable angina Cardiology consult Keep n.p.o. after midnight for cardiac cath tomorrow a.m.   # DM2 (diabetes mellitus, type 2) Sliding scale insulin coverage   # Generalized anxiety disorder Continue Lexapro   # Mixed  hyperlipidemia Continue rosuvastatin   # Essential hypertension Continue home meds     Body mass index is 26.63 kg/m.  Interventions:  Diet: Heart healthy/carb modified diet DVT Prophylaxis: Therapeutic Anticoagulation with heparin IV infusion    Advance goals of care discussion: Full code  Family Communication: family was present at bedside, at the time of interview.  The pt provided permission to discuss medical plan with the family. Opportunity was given to ask question and all questions were answered satisfactorily.   Disposition:  Pt is from home, admitted with unstable angina, chest pain, still on IV heparin infusion, pending cardiac cath, which precludes a safe discharge. Discharge to home, when stable, most likely after cath on Monday DC plan maybe on Tuesday.  Subjective: No significant events overnight, patient was resting comfortably in the bed.  No chest pain or palpitation, no shortness of breath. Informed to be n.p.o. after midnight for possible cardiac cath tomorrow a.m.  Physical Exam: General: NAD, lying comfortably Appear in no distress, affect appropriate Eyes: PERRLA ENT: Oral Mucosa Clear, moist  Neck: no JVD,  Cardiovascular: S1 and S2 Present, no Murmur,  Respiratory: good respiratory effort, Bilateral Air entry equal and Decreased, no Crackles, no wheezes Abdomen: Bowel Sound present, Soft and no tenderness,  Skin: no rashes Extremities: no Pedal edema, no calf tenderness Neurologic: without any new focal findings Gait not checked due to patient safety concerns  Vitals:   12/27/23 0211 12/27/23 0512 12/27/23 0759 12/27/23 1105  BP: 116/65 123/72 115/67 116/61  Pulse: (!) 58 (!) 59 (!) 59 61  Resp: 18 20 18  19  Temp: 97.7 F (36.5 C) (!) 97.5 F (36.4 C) 97.6 F (36.4 C) 97.6 F (36.4 C)  TempSrc: Oral Oral    SpO2: 97% 98% 97% 96%  Weight:      Height:        Intake/Output Summary (Last 24 hours) at 12/27/2023 1536 Last data filed at  12/27/2023 0900 Gross per 24 hour  Intake 227.85 ml  Output --  Net 227.85 ml   Filed Weights   12/26/23 0009  Weight: 72.6 kg    Data Reviewed: I have personally reviewed and interpreted daily labs, tele strips, imagings as discussed above. I reviewed all nursing notes, pharmacy notes, vitals, pertinent old records I have discussed plan of care as described above with RN and patient/family.  CBC: Recent Labs  Lab 12/26/23 0016 12/27/23 0242  WBC 8.6 8.1  HGB 12.2* 12.3*  HCT 36.5* 36.6*  MCV 83.1 82.4  PLT 283 268   Basic Metabolic Panel: Recent Labs  Lab 12/26/23 0016 12/27/23 0242  NA 134* 137  K 3.7 4.4  CL 101 104  CO2 24 24  GLUCOSE 177* 157*  BUN 19 17  CREATININE 1.10 0.90  CALCIUM 9.5 9.2  MG  --  2.1  PHOS  --  3.7    Studies: No results found.  Scheduled Meds:  aspirin EC  81 mg Oral Daily   clopidogrel  75 mg Oral Daily   escitalopram  20 mg Oral Daily   insulin aspart  0-15 Units Subcutaneous TID WC   insulin aspart  0-5 Units Subcutaneous QHS   isosorbide mononitrate  30 mg Oral Daily   metoprolol succinate  100 mg Oral Daily   pantoprazole  40 mg Oral Daily   rosuvastatin  40 mg Oral Daily   spironolactone  12.5 mg Oral Daily   Continuous Infusions:  heparin 900 Units/hr (12/27/23 0813)   PRN Meds: acetaminophen, ALPRAZolam, morphine injection, nitroGLYCERIN, ondansetron (ZOFRAN) IV, mouth rinse, polyvinyl alcohol  Time spent: 35 minutes  Author: Althia Atlas. MD [facility] 12/27/2023 3:36 PM  To reach On-call, see care teams to locate the attending and reach out to them via [email or web address]. If 7PM-7AM, please contact night-coverage If you still have difficulty reaching the attending provider, please page the [facility] (Director on Call) for [facility] on amion for assistance.

## 2023-12-27 NOTE — Progress Notes (Signed)
 PHARMACY - ANTICOAGULATION CONSULT NOTE  Pharmacy Consult for Heparin  Indication: chest pain/ACS  Allergies  Allergen Reactions   Atorvastatin     Other reaction(s): Muscle Pain   Vicodin [Hydrocodone-Acetaminophen] Itching   Lactalbumin     Other reaction(s): Other (See Comments) Sneezing and post nasal drip   Milk-Related Compounds Other (See Comments)    Sneezing and post nasal drip    Patient Measurements: Height: 5\' 5"  (165.1 cm) Weight: 72.6 kg (160 lb) IBW/kg (Calculated) : 61.5 HEPARIN DW (KG): 72.6  Vital Signs: Temp: 97.6 F (36.4 C) (04/13 0759) Temp Source: Oral (04/13 0512) BP: 115/67 (04/13 0759) Pulse Rate: 59 (04/13 0759)  Labs: Recent Labs    12/26/23 0016 12/26/23 0158 12/26/23 1032 12/26/23 1858 12/27/23 0242 12/27/23 0856  HGB 12.2*  --   --   --  12.3*  --   HCT 36.5*  --   --   --  36.6*  --   PLT 283  --   --   --  268  --   HEPARINUNFRC  --   --    < > 0.19* 0.75* 0.59  CREATININE 1.10  --   --   --  0.90  --   TROPONINIHS 10 11  --   --   --   --    < > = values in this interval not displayed.    Estimated Creatinine Clearance: 69.3 mL/min (by C-G formula based on SCr of 0.9 mg/dL).   Medical History: Past Medical History:  Diagnosis Date   Alcohol abuse    Arthritis    Chronic back pain    Chronic kidney disease    Complication of anesthesia    had difficulty with breathing, had difficulty waking up   Coronary artery disease    Diabetes mellitus without complication (HCC)    type 2   GERD (gastroesophageal reflux disease)    Gonorrhea    Headache(784.0)    History of kidney stones    Hypercholesteremia    no medication at this time   Hypertension    Trigger finger    on both hands   Ulcer    on medication   Medications:  Medications Prior to Admission  Medication Sig Dispense Refill Last Dose/Taking   aspirin EC 81 MG tablet Take 1 tablet (81 mg total) by mouth daily. 30 tablet 0 12/25/2023   clopidogrel (PLAVIX)  75 MG tablet Take 75 mg by mouth daily.   12/25/2023 Morning   diphenhydrAMINE (BENADRYL) 25 MG tablet Take 25 mg by mouth every 6 (six) hours as needed.   Taking As Needed   escitalopram (LEXAPRO) 20 MG tablet Take 20 mg by mouth daily.   12/25/2023 Morning   isosorbide mononitrate (IMDUR) 30 MG 24 hr tablet Take 1 tablet (30 mg total) by mouth daily. 30 tablet 5 12/25/2023 Morning   losartan-hydrochlorothiazide (HYZAAR) 100-12.5 MG tablet Take 1 tablet by mouth daily.   12/25/2023 Morning   metoprolol succinate (TOPROL-XL) 100 MG 24 hr tablet Take 100 mg by mouth daily.   12/25/2023 Morning   pantoprazole (PROTONIX) 40 MG tablet Take 40 mg by mouth daily.   12/25/2023 Morning   rosuvastatin (CRESTOR) 40 MG tablet Take 1 tablet (40 mg total) by mouth daily. 30 tablet 0 12/25/2023 Bedtime   Semaglutide (RYBELSUS) 3 MG TABS Take 3 mg by mouth daily.   Past Week   blood glucose meter kit and supplies Dispense based on patient and insurance preference.  Use up to four times daily as directed. (FOR ICD-9 250.00, 250.01). 1 each 0    cetirizine (ZYRTEC) 10 MG tablet Take 1 tablet (10 mg total) by mouth daily. (Patient not taking: Reported on 12/26/2023) 30 tablet 0 Not Taking   hydrALAZINE (APRESOLINE) 25 MG tablet Take 1 tablet (25 mg total) by mouth every 12 (twelve) hours. (Patient not taking: Reported on 12/26/2023) 60 tablet 5 Not Taking   metFORMIN (GLUCOPHAGE-XR) 500 MG 24 hr tablet Take 500 mg by mouth 2 (two) times daily. (Patient not taking: Reported on 12/26/2023)   Not Taking   spironolactone (ALDACTONE) 25 MG tablet Take 0.5 tablets (12.5 mg total) by mouth daily. (Patient not taking: Reported on 12/26/2023) 15 tablet 5 Not Taking   Assessment: Pharmacy consulted to dose heparin in this 68 year old male admitted with ACS/NSTEMI.  No prior anticoag noted.  CrCl = 56.7 ml/min  0412 1032 HL 0.85   SUPRAtherapeutic 0412 1858 HL 0.19   SUBtherapeutic 0413 0242 HL 0.75   SUPRAtherapeutic 4/13  0856 HL  0.59 Therapeutic x 1   Goal of Therapy:  Heparin level 0.3-0.7 units/ml Monitor platelets by anticoagulation protocol: Yes   Plan:  4/13  0856 HL 0.59 Therapeutic x 1  - Will continue heparin drip rate at 900 units/hr and check confirmatory HL in 6 hours Continue to monitor H&H and platelets  Thank you for involving pharmacy in this patient's care.   Rafeef Lau A Clinical Pharmacist 12/27/2023 10:02 AM

## 2023-12-27 NOTE — Progress Notes (Signed)
 PHARMACY - ANTICOAGULATION CONSULT NOTE  Pharmacy Consult for Heparin  Indication: chest pain/ACS  Allergies  Allergen Reactions   Atorvastatin     Other reaction(s): Muscle Pain   Vicodin [Hydrocodone-Acetaminophen] Itching   Lactalbumin     Other reaction(s): Other (See Comments) Sneezing and post nasal drip   Milk-Related Compounds Other (See Comments)    Sneezing and post nasal drip    Patient Measurements: Height: 5\' 5"  (165.1 cm) Weight: 72.6 kg (160 lb) IBW/kg (Calculated) : 61.5 HEPARIN DW (KG): 72.6  Vital Signs: Temp: 97.6 F (36.4 C) (04/13 1105) Temp Source: Oral (04/13 0512) BP: 116/61 (04/13 1105) Pulse Rate: 61 (04/13 1105)  Labs: Recent Labs    12/26/23 0016 12/26/23 0158 12/26/23 1032 12/27/23 0242 12/27/23 0856 12/27/23 1500  HGB 12.2*  --   --  12.3*  --   --   HCT 36.5*  --   --  36.6*  --   --   PLT 283  --   --  268  --   --   HEPARINUNFRC  --   --    < > 0.75* 0.59 0.52  CREATININE 1.10  --   --  0.90  --   --   TROPONINIHS 10 11  --   --   --   --    < > = values in this interval not displayed.    Estimated Creatinine Clearance: 69.3 mL/min (by C-G formula based on SCr of 0.9 mg/dL).   Medical History: Past Medical History:  Diagnosis Date   Alcohol abuse    Arthritis    Chronic back pain    Chronic kidney disease    Complication of anesthesia    had difficulty with breathing, had difficulty waking up   Coronary artery disease    Diabetes mellitus without complication (HCC)    type 2   GERD (gastroesophageal reflux disease)    Gonorrhea    Headache(784.0)    History of kidney stones    Hypercholesteremia    no medication at this time   Hypertension    Trigger finger    on both hands   Ulcer    on medication   Medications:  Medications Prior to Admission  Medication Sig Dispense Refill Last Dose/Taking   aspirin EC 81 MG tablet Take 1 tablet (81 mg total) by mouth daily. 30 tablet 0 12/25/2023   clopidogrel (PLAVIX)  75 MG tablet Take 75 mg by mouth daily.   12/25/2023 Morning   diphenhydrAMINE (BENADRYL) 25 MG tablet Take 25 mg by mouth every 6 (six) hours as needed.   Taking As Needed   escitalopram (LEXAPRO) 20 MG tablet Take 20 mg by mouth daily.   12/25/2023 Morning   isosorbide mononitrate (IMDUR) 30 MG 24 hr tablet Take 1 tablet (30 mg total) by mouth daily. 30 tablet 5 12/25/2023 Morning   losartan-hydrochlorothiazide (HYZAAR) 100-12.5 MG tablet Take 1 tablet by mouth daily.   12/25/2023 Morning   metoprolol succinate (TOPROL-XL) 100 MG 24 hr tablet Take 100 mg by mouth daily.   12/25/2023 Morning   pantoprazole (PROTONIX) 40 MG tablet Take 40 mg by mouth daily.   12/25/2023 Morning   rosuvastatin (CRESTOR) 40 MG tablet Take 1 tablet (40 mg total) by mouth daily. 30 tablet 0 12/25/2023 Bedtime   Semaglutide (RYBELSUS) 3 MG TABS Take 3 mg by mouth daily.   Past Week   blood glucose meter kit and supplies Dispense based on patient and insurance preference.  Use up to four times daily as directed. (FOR ICD-9 250.00, 250.01). 1 each 0    cetirizine (ZYRTEC) 10 MG tablet Take 1 tablet (10 mg total) by mouth daily. (Patient not taking: Reported on 12/26/2023) 30 tablet 0 Not Taking   hydrALAZINE (APRESOLINE) 25 MG tablet Take 1 tablet (25 mg total) by mouth every 12 (twelve) hours. (Patient not taking: Reported on 12/26/2023) 60 tablet 5 Not Taking   metFORMIN (GLUCOPHAGE-XR) 500 MG 24 hr tablet Take 500 mg by mouth 2 (two) times daily. (Patient not taking: Reported on 12/26/2023)   Not Taking   spironolactone (ALDACTONE) 25 MG tablet Take 0.5 tablets (12.5 mg total) by mouth daily. (Patient not taking: Reported on 12/26/2023) 15 tablet 5 Not Taking   Assessment: Pharmacy consulted to dose heparin in this 68 year old male admitted with ACS/NSTEMI.  No prior anticoag noted.  CrCl = 56.7 ml/min  0412 1032 HL 0.85   SUPRAtherapeutic 0412 1858 HL 0.19   SUBtherapeutic 0413 0242 HL 0.75   SUPRAtherapeutic 4/13  0856 HL  0.59 Therapeutic x 1  4/13  1500 HL 0.52 Therapeutic x 2  Goal of Therapy:  Heparin level 0.3-0.7 units/ml Monitor platelets by anticoagulation protocol: Yes   Plan:  - Will continue heparin drip rate at 900 units/hr and check  HL with am labs Continue to monitor H&H and platelets  Thank you for involving pharmacy in this patient's care.   Nnenna Meador A Clinical Pharmacist 12/27/2023 3:40 PM

## 2023-12-27 NOTE — Plan of Care (Signed)

## 2023-12-27 NOTE — Progress Notes (Signed)
 Pt is transferred from ED to [id number]. Alert, fully oriented x 4, afebrile, on room air, normal respiratory effort, stable hemodynamically, NSR on the monitor, denies chest pain at arrival. On heparin gtt at 950 units/hr.  He is able to ambulate to the bathroom independently. No SOB.No obvious acute distress. We will continue to monitor.      12/26/23 2105  Vitals  Temp 98.3 F (36.8 C)  Temp Source Oral  BP 116/64  MAP (mmHg) 79  BP Location Left Arm  BP Method Automatic  Patient Position (if appropriate) Lying  Pulse Rate 60  Pulse Rate Source Monitor  ECG Heart Rate 60  Resp 20  Level of Consciousness  Level of Consciousness Alert  MEWS COLOR  MEWS Score Color Green  Oxygen Therapy  SpO2 98 %  O2 Device Room Air  Patient Activity (if Appropriate) In bed  Pulse Oximetry Type Intermittent  Pain Assessment  Pain Scale 0-10  Pain Score 0    Brain Cahill, RN

## 2023-12-27 NOTE — Progress Notes (Signed)
 SUBJECTIVE: Patient denies any chest pain.   Vitals:   12/27/23 0211 12/27/23 0512 12/27/23 0759 12/27/23 1105  BP: 116/65 123/72 115/67 116/61  Pulse: (!) 58 (!) 59 (!) 59 61  Resp: 18 20 18 19   Temp: 97.7 F (36.5 C) (!) 97.5 F (36.4 C) 97.6 F (36.4 C) 97.6 F (36.4 C)  TempSrc: Oral Oral    SpO2: 97% 98% 97% 96%  Weight:      Height:        Intake/Output Summary (Last 24 hours) at 12/27/2023 1214 Last data filed at 12/27/2023 0900 Gross per 24 hour  Intake 227.85 ml  Output --  Net 227.85 ml    LABS: Basic Metabolic Panel: Recent Labs    12/26/23 0016 12/27/23 0242  NA 134* 137  K 3.7 4.4  CL 101 104  CO2 24 24  GLUCOSE 177* 157*  BUN 19 17  CREATININE 1.10 0.90  CALCIUM 9.5 9.2  MG  --  2.1  PHOS  --  3.7   Liver Function Tests: No results for input(s): "AST", "ALT", "ALKPHOS", "BILITOT", "PROT", "ALBUMIN" in the last 72 hours. No results for input(s): "LIPASE", "AMYLASE" in the last 72 hours. CBC: Recent Labs    12/26/23 0016 12/27/23 0242  WBC 8.6 8.1  HGB 12.2* 12.3*  HCT 36.5* 36.6*  MCV 83.1 82.4  PLT 283 268   Cardiac Enzymes: No results for input(s): "CKTOTAL", "CKMB", "CKMBINDEX", "TROPONINI" in the last 72 hours. BNP: Invalid input(s): "POCBNP" D-Dimer: No results for input(s): "DDIMER" in the last 72 hours. Hemoglobin A1C: No results for input(s): "HGBA1C" in the last 72 hours. Fasting Lipid Panel: No results for input(s): "CHOL", "HDL", "LDLCALC", "TRIG", "CHOLHDL", "LDLDIRECT" in the last 72 hours. Thyroid Function Tests: No results for input(s): "TSH", "T4TOTAL", "T3FREE", "THYROIDAB" in the last 72 hours.  Invalid input(s): "FREET3" Anemia Panel: No results for input(s): "VITAMINB12", "FOLATE", "FERRITIN", "TIBC", "IRON", "RETICCTPCT" in the last 72 hours.   PHYSICAL EXAM General: Well developed, well nourished, in no acute distress HEENT:  Normocephalic and atramatic Neck:  No JVD.  Lungs: Clear bilaterally to  auscultation and percussion. Heart: HRRR . Normal S1 and S2 without gallops or murmurs.  Abdomen: Bowel sounds are positive, abdomen soft and non-tender  Msk:  Back normal, normal gait. Normal strength and tone for age. Extremities: No clubbing, cyanosis or edema.   Neuro: Alert and oriented X 3. Psych:  Good affect, responds appropriately  TELEMETRY: Sinus rhythm  ASSESSMENT AND PLAN:    ICD-10-CM   1. 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  Patient had a cardiac catheterization in November 2024 when PCI of vein graft to diagonal was done for non-STEMI.  LIMA to the LAD was patent but SVG to OM was occluded.  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  It was decided to treat that medically at that time.  Patient now presents with chest pain at rest most likely will need intervention of native left circumflex.  Thus we will set up the patient for cardiac catheterization.  He may need to be done at [facility] or tertiary care center.  Debborah Fairly, MD, [facility] 12/27/2023 12:14 PM

## 2023-12-27 NOTE — Progress Notes (Signed)
 PHARMACY - ANTICOAGULATION CONSULT NOTE  Pharmacy Consult for Heparin  Indication: chest pain/ACS  Allergies  Allergen Reactions   Atorvastatin     Other reaction(s): Muscle Pain   Vicodin [Hydrocodone-Acetaminophen] Itching   Lactalbumin     Other reaction(s): Other (See Comments) Sneezing and post nasal drip   Milk-Related Compounds Other (See Comments)    Sneezing and post nasal drip    Patient Measurements: Height: 5\' 5"  (165.1 cm) Weight: 72.6 kg (160 lb) IBW/kg (Calculated) : 61.5 HEPARIN DW (KG): 72.6  Vital Signs: Temp: 97.7 F (36.5 C) (04/13 0211) Temp Source: Oral (04/13 0211) BP: 116/65 (04/13 0211) Pulse Rate: 58 (04/13 0211)  Labs: Recent Labs    12/26/23 0016 12/26/23 0158 12/26/23 1032 12/26/23 1858 12/27/23 0242  HGB 12.2*  --   --   --  12.3*  HCT 36.5*  --   --   --  36.6*  PLT 283  --   --   --  268  HEPARINUNFRC  --   --  0.85* 0.19* 0.75*  CREATININE 1.10  --   --   --  0.90  TROPONINIHS 10 11  --   --   --     Estimated Creatinine Clearance: 69.3 mL/min (by C-G formula based on SCr of 0.9 mg/dL).   Medical History: Past Medical History:  Diagnosis Date   Alcohol abuse    Arthritis    Chronic back pain    Chronic kidney disease    Complication of anesthesia    had difficulty with breathing, had difficulty waking up   Coronary artery disease    Diabetes mellitus without complication (HCC)    type 2   GERD (gastroesophageal reflux disease)    Gonorrhea    Headache(784.0)    History of kidney stones    Hypercholesteremia    no medication at this time   Hypertension    Trigger finger    on both hands   Ulcer    on medication   Medications:  Medications Prior to Admission  Medication Sig Dispense Refill Last Dose/Taking   aspirin EC 81 MG tablet Take 1 tablet (81 mg total) by mouth daily. 30 tablet 0 12/25/2023   clopidogrel (PLAVIX) 75 MG tablet Take 75 mg by mouth daily.   12/25/2023 Morning   diphenhydrAMINE (BENADRYL) 25  MG tablet Take 25 mg by mouth every 6 (six) hours as needed.   Taking As Needed   escitalopram (LEXAPRO) 20 MG tablet Take 20 mg by mouth daily.   12/25/2023 Morning   isosorbide mononitrate (IMDUR) 30 MG 24 hr tablet Take 1 tablet (30 mg total) by mouth daily. 30 tablet 5 12/25/2023 Morning   losartan-hydrochlorothiazide (HYZAAR) 100-12.5 MG tablet Take 1 tablet by mouth daily.   12/25/2023 Morning   metoprolol succinate (TOPROL-XL) 100 MG 24 hr tablet Take 100 mg by mouth daily.   12/25/2023 Morning   pantoprazole (PROTONIX) 40 MG tablet Take 40 mg by mouth daily.   12/25/2023 Morning   rosuvastatin (CRESTOR) 40 MG tablet Take 1 tablet (40 mg total) by mouth daily. 30 tablet 0 12/25/2023 Bedtime   Semaglutide (RYBELSUS) 3 MG TABS Take 3 mg by mouth daily.   Past Week   blood glucose meter kit and supplies Dispense based on patient and insurance preference. Use up to four times daily as directed. (FOR ICD-9 250.00, 250.01). 1 each 0    cetirizine (ZYRTEC) 10 MG tablet Take 1 tablet (10 mg total) by mouth daily. (  Patient not taking: Reported on 12/26/2023) 30 tablet 0 Not Taking   hydrALAZINE (APRESOLINE) 25 MG tablet Take 1 tablet (25 mg total) by mouth every 12 (twelve) hours. (Patient not taking: Reported on 12/26/2023) 60 tablet 5 Not Taking   metFORMIN (GLUCOPHAGE-XR) 500 MG 24 hr tablet Take 500 mg by mouth 2 (two) times daily. (Patient not taking: Reported on 12/26/2023)   Not Taking   spironolactone (ALDACTONE) 25 MG tablet Take 0.5 tablets (12.5 mg total) by mouth daily. (Patient not taking: Reported on 12/26/2023) 15 tablet 5 Not Taking   Assessment: Pharmacy consulted to dose heparin in this 68 year old male admitted with ACS/NSTEMI.  No prior anticoag noted.  CrCl = 56.7 ml/min  0412 1032 HL 0.85   SUPRAtherapeutic 0412 1858 HL 0.19   SUBtherapeutic 0413 0242 HL 0.75   SUPRAtherapeutic  Goal of Therapy:  Heparin level 0.3-0.7 units/ml Monitor platelets by anticoagulation protocol: Yes    Plan:  4/13:  HL @ 0243 = 0.75, SUPRAtherapeutic  - Will decrease  heparin drip rate to 900 units/hr and recheck HL 6 hrs after rate change  Continue to monitor H&H and platelets  Thank you for involving pharmacy in this patient's care.   Jack Mineau D Clinical Pharmacist 12/27/2023 3:07 AM

## 2023-12-28 ENCOUNTER — Encounter
Admission: EM | Disposition: A | Discharge status: Home / Self Care | Payer: Self-pay | Source: Home / Self Care | Attending: Student

## 2023-12-28 ENCOUNTER — Ambulatory Visit: Admission: RE | Admit: 2023-12-28 | Source: Home / Self Care | Admitting: Internal Medicine

## 2023-12-28 ENCOUNTER — Encounter: Admission: RE | Payer: Self-pay | Source: Home / Self Care

## 2023-12-28 ENCOUNTER — Other Ambulatory Visit: Payer: Self-pay

## 2023-12-28 DIAGNOSIS — R0602 Shortness of breath: Secondary | ICD-10-CM

## 2023-12-28 DIAGNOSIS — I200 Unstable angina: Secondary | ICD-10-CM | POA: Diagnosis not present

## 2023-12-28 HISTORY — PX: LEFT HEART CATH AND CORONARY ANGIOGRAPHY: CATH118249

## 2023-12-28 LAB — CBC
HCT: 36.7 % — ABNORMAL LOW (ref 39.0–52.0)
Hemoglobin: 12.3 g/dL — ABNORMAL LOW (ref 13.0–17.0)
MCH: 27.5 pg (ref 26.0–34.0)
MCHC: 33.5 g/dL (ref 30.0–36.0)
MCV: 82.1 fL (ref 80.0–100.0)
Platelets: 255 10*3/uL (ref 150–400)
RBC: 4.47 MIL/uL (ref 4.22–5.81)
RDW: 14.1 % (ref 11.5–15.5)
WBC: 7.6 10*3/uL (ref 4.0–10.5)
nRBC: 0 % (ref 0.0–0.2)

## 2023-12-28 LAB — BASIC METABOLIC PANEL WITH GFR
Anion gap: 10 (ref 5–15)
BUN: 19 mg/dL (ref 8–23)
CO2: 23 mmol/L (ref 22–32)
Calcium: 8.8 mg/dL — ABNORMAL LOW (ref 8.9–10.3)
Chloride: 102 mmol/L (ref 98–111)
Creatinine, Ser: 0.92 mg/dL (ref 0.61–1.24)
GFR, Estimated: >60 mL/min (ref >60)
Glucose, Bld: 144 mg/dL — ABNORMAL HIGH (ref 70–99)
Potassium: 3.9 mmol/L (ref 3.5–5.1)
Sodium: 135 mmol/L (ref 135–145)

## 2023-12-28 LAB — MAGNESIUM: Magnesium: 2.3 mg/dL (ref 1.7–2.4)

## 2023-12-28 LAB — HEPARIN LEVEL (UNFRACTIONATED): Heparin Unfractionated: 0.89 [IU]/mL — ABNORMAL HIGH (ref 0.30–0.70)

## 2023-12-28 LAB — GLUCOSE, CAPILLARY: Glucose-Capillary: 149 mg/dL — ABNORMAL HIGH (ref 70–99)

## 2023-12-28 LAB — SURGICAL PCR SCREEN
MRSA, PCR: NEGATIVE
Staphylococcus aureus: NEGATIVE

## 2023-12-28 LAB — CARDIAC CATHETERIZATION: Cath EF Quantitative: 50 %

## 2023-12-28 LAB — PHOSPHORUS: Phosphorus: 3.8 mg/dL (ref 2.5–4.6)

## 2023-12-28 SURGERY — LEFT HEART CATH AND CORS/GRAFTS ANGIOGRAPHY
Anesthesia: Moderate Sedation | Laterality: Left

## 2023-12-28 SURGERY — LEFT HEART CATH AND CORONARY ANGIOGRAPHY
Anesthesia: Moderate Sedation

## 2023-12-28 MED ORDER — FENTANYL CITRATE (PF) 100 MCG/2ML IJ SOLN
INTRAMUSCULAR | Status: AC
  Filled 2023-12-28: qty 2

## 2023-12-28 MED ORDER — RANOLAZINE ER 500 MG PO TB12
500.0000 mg | ORAL_TABLET | Freq: Two times a day (BID) | ORAL | 11 refills | Status: AC
  Filled 2023-12-28: qty 60, 30d supply, fill #0

## 2023-12-28 MED ORDER — SPIRONOLACTONE 25 MG PO TABS
12.5000 mg | ORAL_TABLET | Freq: Every day | ORAL | 5 refills | Status: AC
  Filled 2023-12-28: qty 15, 30d supply, fill #0

## 2023-12-28 MED ORDER — ACETAMINOPHEN 325 MG PO TABS
650.0000 mg | ORAL_TABLET | ORAL | Status: DC | PRN
  Administered 2023-12-28: 650 mg via ORAL

## 2023-12-28 MED ORDER — HEPARIN (PORCINE) IN NACL 2000-0.9 UNIT/L-% IV SOLN
INTRAVENOUS | Status: DC | PRN
  Administered 2023-12-28: 1000 mL

## 2023-12-28 MED ORDER — IOHEXOL 300 MG/ML  SOLN
INTRAMUSCULAR | Status: DC | PRN
  Administered 2023-12-28: 90 mL

## 2023-12-28 MED ORDER — SODIUM CHLORIDE 0.9 % WEIGHT BASED INFUSION: 1.0000 mL/kg/h | INTRAVENOUS | Status: DC

## 2023-12-28 MED ORDER — ISOSORBIDE MONONITRATE ER 60 MG PO TB24
30.0000 mg | ORAL_TABLET | Freq: Every day | ORAL | 11 refills | Status: AC
  Filled 2023-12-28: qty 30, 60d supply, fill #0

## 2023-12-28 MED ORDER — LIDOCAINE HCL 1 % IJ SOLN
INTRAMUSCULAR | Status: AC
  Filled 2023-12-28: qty 20

## 2023-12-28 MED ORDER — FENTANYL CITRATE (PF) 100 MCG/2ML IJ SOLN
INTRAMUSCULAR | Status: DC | PRN
  Administered 2023-12-28: 25 ug via INTRAVENOUS

## 2023-12-28 MED ORDER — ENOXAPARIN SODIUM 40 MG/0.4ML IJ SOSY: 40.0000 mg | PREFILLED_SYRINGE | INTRAMUSCULAR | Status: DC

## 2023-12-28 MED ORDER — ACETAMINOPHEN 325 MG PO TABS
ORAL_TABLET | ORAL | Status: AC
  Filled 2023-12-28: qty 2

## 2023-12-28 MED ORDER — RANOLAZINE ER 500 MG PO TB12
500.0000 mg | ORAL_TABLET | Freq: Two times a day (BID) | ORAL | Status: DC
  Administered 2023-12-28: 500 mg via ORAL
  Filled 2023-12-28: qty 1

## 2023-12-28 MED ORDER — ISOSORBIDE MONONITRATE ER 60 MG PO TB24
60.0000 mg | ORAL_TABLET | Freq: Every day | ORAL | Status: DC
  Filled 2023-12-28: qty 1

## 2023-12-28 MED ORDER — LIDOCAINE HCL (PF) 1 % IJ SOLN
INTRAMUSCULAR | Status: DC | PRN
  Administered 2023-12-28: 10 mL

## 2023-12-28 MED ORDER — MIDAZOLAM HCL 2 MG/2ML IJ SOLN
INTRAMUSCULAR | Status: DC | PRN
  Administered 2023-12-28: 1 mg via INTRAVENOUS

## 2023-12-28 MED ORDER — MIDAZOLAM HCL 2 MG/2ML IJ SOLN
INTRAMUSCULAR | Status: AC
  Filled 2023-12-28: qty 2

## 2023-12-28 SURGICAL SUPPLY — 11 items
CATH INFINITI 5FR MULTPACK ANG (CATHETERS) IMPLANT
DEVICE CLOSURE MYNXGRIP 5F (Vascular Products) IMPLANT
DRAPE BRACHIAL (DRAPES) IMPLANT
NDL PERC 18GX7CM (NEEDLE) IMPLANT
NEEDLE PERC 18GX7CM (NEEDLE) ×1 IMPLANT
PACK CARDIAC CATH (CUSTOM PROCEDURE TRAY) ×2 IMPLANT
PROTECTION STATION PRESSURIZED (MISCELLANEOUS) ×1 IMPLANT
SET ATX-X65L (MISCELLANEOUS) IMPLANT
SHEATH AVANTI 5FR X 11CM (SHEATH) IMPLANT
STATION PROTECTION PRESSURIZED (MISCELLANEOUS) IMPLANT
WIRE GUIDERIGHT .035X150 (WIRE) IMPLANT

## 2023-12-28 NOTE — TOC Progression Note (Signed)
 Transition of Care [facility]) - Progression Note    Patient Details  Name: Tyler Russell MRN: [id number] Date of Birth: 03/20/1956  Transition of Care [facility]) CM/SW Contact  Baird Bombard, RN Phone Number: 12/28/2023, 2:19 PM  Clinical Narrative:    TOC continuing to follow patient's progress throughout discharge planning.        Expected Discharge Plan and Services         Expected Discharge Date: 12/28/23                                     Social Determinants of Health (SDOH) Interventions SDOH Screenings   Food Insecurity: No Food Insecurity (12/26/2023)  Housing: Low Risk  (12/26/2023)  Transportation Needs: No Transportation Needs (12/26/2023)  Utilities: Not At Risk (12/26/2023)  Social Connections: Unknown (12/26/2023)  Tobacco Use: Medium Risk (12/26/2023)    Readmission Risk Interventions     No data to display

## 2023-12-28 NOTE — Progress Notes (Signed)
 Plan of care was reviewed. Pt has been progressing. No chest pain overnight, stable hemodynamically, NSR on the monitor, afebrile, normal respiratory effort. No acute distress.   NPO after midnight, pre-op prep was done, consent was signed. Cardiac catheterization education was provided. All questions were answered. Pt expressed his understanding with the procedure. We will continue to monitor.  Brain Cahill, RN

## 2023-12-28 NOTE — Progress Notes (Signed)
 [facility] CLINIC CARDIOLOGY PROGRESS NOTE   Patient ID: Tyler Russell MRN: [id number] DOB/AGE: 68-Jan-1957 68 y.o.  Admit date: 12/26/2023 Referring Physician Dr. Lindajo Royal Primary Physician [facility]  Primary Cardiologist Dr. Melton Alar Reason for Consultation chest pain  HPI: Tyler Russell is a 68 y.o. male with a past medical history of coronary artery disease s/p bypass grafting 2017, hypertension, type 2 diabetes, hyperlipidemia who presented to the ED on 12/26/2023 for chest pain. Cardiology was consulted for further evaluation.  Interval History:  -Patient seen and examined this morning, resting comfortably in stretcher. - He is scheduled for left heart catheterization today around lunchtime.  Patient reports he has had this procedure before. - States that he is not having any chest pain or shortness of breath at the moment. - BP and heart rate remained stable.  Review of systems complete and found to be negative unless listed above    Vitals:   12/28/23 0800 12/28/23 0830 12/28/23 0900 12/28/23 0930  BP: 123/67 131/72 116/72 109/66  Pulse: 63 64 63 60  Resp: 12 13 13 14   Temp:      TempSrc:      SpO2: 94% 95% 95% 94%  Weight:      Height:         Intake/Output Summary (Last 24 hours) at 12/28/2023 0949 Last data filed at 12/28/2023 7829 Gross per 24 hour  Intake 923.11 ml  Output --  Net 923.11 ml     PHYSICAL EXAM General: Well-appearing male, well nourished, in no acute distress. HEENT: Normocephalic and atraumatic. Neck: No JVD.  Lungs: Normal respiratory effort on room air. Clear bilaterally to auscultation. No wheezes, crackles, rhonchi.  Heart: HRRR. Normal S1 and S2 without gallops or murmurs. Radial & DP pulses 2+ bilaterally. Abdomen: Non-distended appearing.  Msk: Normal strength and tone for age. Extremities: No clubbing, cyanosis or edema.   Neuro: Alert and oriented X 3. Psych: Mood appropriate, affect congruent.    LABS: Basic  Metabolic Panel: Recent Labs    12/27/23 0242 12/28/23 0417  NA 137 135  K 4.4 3.9  CL 104 102  CO2 24 23  GLUCOSE 157* 144*  BUN 17 19  CREATININE 0.90 0.92  CALCIUM 9.2 8.8*  MG 2.1 2.3  PHOS 3.7 3.8   Liver Function Tests: No results for input(s): "AST", "ALT", "ALKPHOS", "BILITOT", "PROT", "ALBUMIN" in the last 72 hours. No results for input(s): "LIPASE", "AMYLASE" in the last 72 hours. CBC: Recent Labs    12/27/23 0242 12/28/23 0417  WBC 8.1 7.6  HGB 12.3* 12.3*  HCT 36.6* 36.7*  MCV 82.4 82.1  PLT 268 255   Cardiac Enzymes: Recent Labs    12/26/23 0016 12/26/23 0158  TROPONINIHS 10 11   BNP: No results for input(s): "BNP" in the last 72 hours. D-Dimer: No results for input(s): "DDIMER" in the last 72 hours. Hemoglobin A1C: No results for input(s): "HGBA1C" in the last 72 hours. Fasting Lipid Panel: No results for input(s): "CHOL", "HDL", "LDLCALC", "TRIG", "CHOLHDL", "LDLDIRECT" in the last 72 hours. Thyroid Function Tests: No results for input(s): "TSH", "T4TOTAL", "T3FREE", "THYROIDAB" in the last 72 hours.  Invalid input(s): "FREET3" Anemia Panel: No results for input(s): "VITAMINB12", "FOLATE", "FERRITIN", "TIBC", "IRON", "RETICCTPCT" in the last 72 hours.  No results found.   ECHO 07/2023: 1. Left ventricular ejection fraction, by estimation, is 45 to 50%. The left ventricle has mildly decreased function. There is mild left ventricular hypertrophy. Left ventricular diastolic parameters are  consistent with Grade I diastolic dysfunction (impaired relaxation).   2. Right ventricular systolic function is normal. The right ventricular  size is normal.   3. Mild mitral valve regurgitation.   4. The aortic valve was not well visualized. There is mild thickening of  the aortic valve. Aortic valve regurgitation is not visualized.   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 male with a past medical history of coronary artery disease s/p bypass grafting 2017, hypertension, type 2 diabetes, hyperlipidemia who presented to the ED on 12/26/2023 for chest pain. Cardiology was consulted for further evaluation.  # Unstable angina # Coronary artery disease s/p CABG 2017 # Hypertension # Hyperlipidemia Patient presented to the ED with complaints of worsening chest pain relieved with nitroglycerin.  Troponins normal x 2 at 10, 11.  EKG without acute ischemic changes.  Remained chest pain-free without admission however he had been previously set up for outpatient catheterization today and thus was kept inpatient over the weekend to proceed with this. -Continue aspirin 81 mg daily, Plavix 75 mg daily, Crestor 40 mg daily. -Continue metoprolol succinate 100 mg daily, Imdur 30 mg daily, spironolactone 12.5 mg daily. -Discussed the risks and benefits of proceeding with LHC for further evaluation with the patient.  He is agreeable to proceed.  NPO until LHC this afternoon (12/28/2023) with Dr. Beau Bound.  Written consent will be obtained.  Further recommendations following LHC.    This patient's case was discussed and created with Dr. Beau Bound and he is in agreement.  Signed:  Hamp Levine, PA-C  12/28/2023, 9:49 AM [facility]

## 2023-12-28 NOTE — Progress Notes (Signed)
 PHARMACY - ANTICOAGULATION CONSULT NOTE  Pharmacy Consult for Heparin  Indication: chest pain/ACS  Allergies  Allergen Reactions   Atorvastatin     Other reaction(s): Muscle Pain   Vicodin [Hydrocodone-Acetaminophen] Itching   Lactalbumin     Other reaction(s): Other (See Comments) Sneezing and post nasal drip   Milk-Related Compounds Other (See Comments)    Sneezing and post nasal drip    Patient Measurements: Height: 5\' 5"  (165.1 cm) Weight: 72.6 kg (160 lb) IBW/kg (Calculated) : 61.5 HEPARIN DW (KG): 72.6  Vital Signs: Temp: 97.3 F (36.3 C) (04/14 0400) Temp Source: Oral (04/14 0400) BP: 120/71 (04/14 0400) Pulse Rate: 61 (04/14 0400)  Labs: Recent Labs    12/26/23 0016 12/26/23 0158 12/26/23 1032 12/27/23 0242 12/27/23 0856 12/27/23 1500 12/28/23 0417  HGB 12.2*  --   --  12.3*  --   --  12.3*  HCT 36.5*  --   --  36.6*  --   --  36.7*  PLT 283  --   --  268  --   --  255  HEPARINUNFRC  --   --    < > 0.75* 0.59 0.52 0.89*  CREATININE 1.10  --   --  0.90  --   --  0.92  TROPONINIHS 10 11  --   --   --   --   --    < > = values in this interval not displayed.    Estimated Creatinine Clearance: 67.8 mL/min (by C-G formula based on SCr of 0.92 mg/dL).   Medical History: Past Medical History:  Diagnosis Date   Alcohol abuse    Arthritis    Chronic back pain    Chronic kidney disease    Complication of anesthesia    had difficulty with breathing, had difficulty waking up   Coronary artery disease    Diabetes mellitus without complication (HCC)    type 2   GERD (gastroesophageal reflux disease)    Gonorrhea    Headache(784.0)    History of kidney stones    Hypercholesteremia    no medication at this time   Hypertension    Trigger finger    on both hands   Ulcer    on medication   Medications:  Medications Prior to Admission  Medication Sig Dispense Refill Last Dose/Taking   aspirin EC 81 MG tablet Take 1 tablet (81 mg total) by mouth  daily. 30 tablet 0 12/25/2023   clopidogrel (PLAVIX) 75 MG tablet Take 75 mg by mouth daily.   12/25/2023 Morning   diphenhydrAMINE (BENADRYL) 25 MG tablet Take 25 mg by mouth every 6 (six) hours as needed.   Taking As Needed   escitalopram (LEXAPRO) 20 MG tablet Take 20 mg by mouth daily.   12/25/2023 Morning   isosorbide mononitrate (IMDUR) 30 MG 24 hr tablet Take 1 tablet (30 mg total) by mouth daily. 30 tablet 5 12/25/2023 Morning   losartan-hydrochlorothiazide (HYZAAR) 100-12.5 MG tablet Take 1 tablet by mouth daily.   12/25/2023 Morning   metoprolol succinate (TOPROL-XL) 100 MG 24 hr tablet Take 100 mg by mouth daily.   12/25/2023 Morning   pantoprazole (PROTONIX) 40 MG tablet Take 40 mg by mouth daily.   12/25/2023 Morning   rosuvastatin (CRESTOR) 40 MG tablet Take 1 tablet (40 mg total) by mouth daily. 30 tablet 0 12/25/2023 Bedtime   Semaglutide (RYBELSUS) 3 MG TABS Take 3 mg by mouth daily.   Past Week   blood glucose meter  kit and supplies Dispense based on patient and insurance preference. Use up to four times daily as directed. (FOR ICD-9 250.00, 250.01). 1 each 0    cetirizine (ZYRTEC) 10 MG tablet Take 1 tablet (10 mg total) by mouth daily. (Patient not taking: Reported on 12/26/2023) 30 tablet 0 Not Taking   hydrALAZINE (APRESOLINE) 25 MG tablet Take 1 tablet (25 mg total) by mouth every 12 (twelve) hours. (Patient not taking: Reported on 12/26/2023) 60 tablet 5 Not Taking   metFORMIN (GLUCOPHAGE-XR) 500 MG 24 hr tablet Take 500 mg by mouth 2 (two) times daily. (Patient not taking: Reported on 12/26/2023)   Not Taking   spironolactone (ALDACTONE) 25 MG tablet Take 0.5 tablets (12.5 mg total) by mouth daily. (Patient not taking: Reported on 12/26/2023) 15 tablet 5 Not Taking   Assessment: Pharmacy consulted to dose heparin in this 68 year old male admitted with ACS/NSTEMI.  No prior anticoag noted.  CrCl = 56.7 ml/min  0412 1032 HL 0.85   SUPRAtherapeutic 0412 1858 HL 0.19    SUBtherapeutic 0413 0242 HL 0.75   SUPRAtherapeutic 4/13  0856 HL 0.59 Therapeutic x 1  4/13  1500 HL 0.52 Therapeutic x 2 4/14  0417 HL 0.80    SUPRAtherapeutic   Goal of Therapy:  Heparin level 0.3-0.7 units/ml Monitor platelets by anticoagulation protocol: Yes   Plan:  4/14:  HL @ 0417 = 0.80, SUPRAtherapeutic  - will decrease  heparin drip to 750 units/hr and recheck HL 6 hrs after rate change Continue to monitor H&H and platelets  Thank you for involving pharmacy in this patient's care.   Janely Gullickson D Clinical Pharmacist 12/28/2023 5:17 AM

## 2023-12-28 NOTE — Discharge Summary (Signed)
 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 Follow-up with cardiology in 1 week Follow up LABS/TEST:  As above   Follow-up Information     Custovic, Lanell Pinta, DO. Go in 1 week(s).   Specialty: Cardiology Contact information: [address] [place] [address] [phone]         [facility] Follow up in 1 week(s).   Contact information: [address] [place] [address] [phone]                Diet recommendation: Cardiac and Carb modified diet  Activity: The patient is advised to gradually reintroduce usual activities, as tolerated  Discharge Condition: stable  Code Status: Full code   History of present illness: As per the H and P dictated on admission Hospital Course:  Tyler Russell is a 68 y.o. male with medical history significant for CAD status post CABG(2017), PCI with stent 07/2023 currently on DAPT, HTN, HLD, IIDM, anxiety/depression being admitted with unstable angina/high risk chest pain.  Patient was last seen by his cardiologist on 3/31 with a complaint of shortness of breath and intermittent chest discomfort that was worse on the day of his appointment.  He is scheduled for repeat cardiac cath in 2 days on 4/14.  His symptoms have continued in spite of optimization of medical therapy.  On the night of admission he had an episode of chest  pain that was more intense lasting for 30 minutes, relieved with nitroglycerin and was chest pain-free by arrival.  His shortness of breath is ongoing and no worse.  He denies cough fever or chills. ED course and data review: Vitals unremarkable and troponin 10-->11 EKG with NSR at 67 with ST depression lateral leads Patient treated with aspirin 325, sublingual nitroglycerin x 2 Hospitalist consulted for admission      Assessment and Plan:   # Unstable angina:  CAD s/p CABG times 12/03/2015, s/p stent 07/2023 Patient with recurrent chest pain and dyspnea on exertion with plans for cath on 4/14. Troponins negative and ST depressions seen on EKG Received full dose aspirin. Continue DAPT, rosuvastatin, metoprolol Nitroglycerin sublingual as needed chest pain with morphine for breakthrough S/p heparin infusion without bolus due to unstable angina. Cardiology consult S/p cardiac cath: Films essentially unchanged from previous procedure Consider intervention of native circumflex as it supplies were used territory in the left dominant system currently essentially unprotected Will review films with other at tertiary care interventionalists consider high risk complex PCI of proximal to mid circumflex Recommend: aggressive advancement escalation of GDMT increase Imdur consider adding Ranexa continue beta-blocker maximize statin management  Patient was cleared by cardiology to discharge home after cardiac cath.  Patient tolerated procedure well denied any complaints and agreed with the discharge planning.    # DM2 (diabetes mellitus, type 2): s/p SSI, currently patient is not on any medication.  Recommended to continue  diabetic diet and follow with PCP. # Generalized anxiety disorder: Continue Lexapro # Mixed hyperlipidemia: Continue rosuvastatin # Essential hypertension: Continue home meds  Body mass index is 26.82 kg/m.  Nutrition Interventions:  - Patient was instructed, not to drive, operate  heavy machinery, perform activities at heights, swimming or participation in water activities or provide baby sitting services while on Pain, Sleep and Anxiety Medications; until his outpatient Physician has advised to do so again.  - Also recommended to not to take more than prescribed Pain, Sleep and Anxiety Medications.  Patient was ambulatory without any assistance. On the day of the discharge the patient's vitals were stable, and no other acute medical condition were reported by patient. the patient was felt safe to be discharge at Home.  Consultants: Cardiology Procedures: Cardiac cath  Discharge Exam: General: Appear in no distress, no Rash; Oral Mucosa Clear, moist. Cardiovascular: S1 and S2 Present, no Murmur, Respiratory: normal respiratory effort, Bilateral Air entry present and no Crackles, no wheezes Abdomen: Bowel Sound present, Soft and no tenderness, no hernia Extremities: no Pedal edema, no calf tenderness Neurology: alert and oriented to time, place, and person affect appropriate.  Filed Weights   12/26/23 0009 12/27/23 2153  Weight: 72.6 kg 73.1 kg   Vitals:   12/28/23 1330 12/28/23 1358  BP: 112/62 106/70  Pulse: (!) 59 61  Resp: 15   Temp:  97.8 F (36.6 C)  SpO2: 95% 100%    DISCHARGE MEDICATION: Allergies as of 12/28/2023       Reactions   Atorvastatin    Other reaction(s): Muscle Pain   Vicodin [hydrocodone-acetaminophen] Itching   Lactalbumin    Other reaction(s): Other (See Comments) Sneezing and post nasal drip   Milk-related Compounds Other (See Comments)   Sneezing and post nasal drip        Medication List     STOP taking these medications    cetirizine 10 MG tablet Commonly known as: ZYRTEC   hydrALAZINE 25 MG tablet Commonly known as: APRESOLINE   losartan-hydrochlorothiazide 100-12.5 MG tablet Commonly known as: HYZAAR   metFORMIN 500 MG 24 hr tablet Commonly known as: GLUCOPHAGE-XR       TAKE these medications     aspirin EC 81 MG tablet Take 1 tablet (81 mg total) by mouth daily.   blood glucose meter kit and supplies Dispense based on patient and insurance preference. Use up to four times daily as directed. (FOR ICD-9 250.00, 250.01).   clopidogrel 75 MG tablet Commonly known as: PLAVIX Take 75 mg by mouth daily.   diphenhydrAMINE 25 MG tablet Commonly known as: BENADRYL Take 25 mg by mouth every 6 (six) hours as needed.   escitalopram 20 MG tablet Commonly known as: LEXAPRO Take 20 mg by mouth daily.   isosorbide mononitrate 60 MG 24 hr tablet Commonly known as: IMDUR Take 0.5 tablets (30 mg total) by mouth daily. What changed: medication strength   metoprolol succinate 100 MG 24 hr tablet Commonly known as: TOPROL-XL Take 100 mg by mouth daily.   pantoprazole 40 MG tablet Commonly known as: PROTONIX Take 40 mg by mouth daily.   ranolazine 500 MG 12 hr tablet Commonly known as: RANEXA Take 1 tablet (500 mg total) by mouth 2 (two) times daily.   rosuvastatin 40 MG tablet Commonly known as: CRESTOR Take 1 tablet (40 mg total) by mouth daily.   Rybelsus 3 MG Tabs Generic drug: Semaglutide Take 3 mg by mouth daily.   spironolactone 25 MG tablet  Commonly known as: ALDACTONE Take 0.5 tablets (12.5 mg total) by mouth daily.       Allergies  Allergen Reactions   Atorvastatin     Other reaction(s): Muscle Pain   Vicodin [Hydrocodone-Acetaminophen] Itching   Lactalbumin     Other reaction(s): Other (See Comments) Sneezing and post nasal drip   Milk-Related Compounds Other (See Comments)    Sneezing and post nasal drip   Discharge Instructions     Call MD for:   Complete by: As directed    Any chest pain or palpitations   Call MD for:  difficulty breathing, headache or visual disturbances   Complete by: As directed    Call MD for:  extreme fatigue   Complete by: As directed    Call MD for:  persistant dizziness or light-headedness   Complete by: As directed     Call MD for:  persistant nausea and vomiting   Complete by: As directed    Call MD for:  severe uncontrolled pain   Complete by: As directed    Diet - low sodium heart healthy   Complete by: As directed    Discharge instructions   Complete by: As directed    Follow-up with PCP in 1 week, continue to monitor BP at home and follow with PCP and cardiology to titrate medication accordingly. Follow-up with cardiology in 1 week   Increase activity slowly   Complete by: As directed        The results of significant diagnostics from this hospitalization (including imaging, microbiology, ancillary and laboratory) are listed below for reference.    Significant Diagnostic Studies: CARDIAC CATHETERIZATION Result Date: 12/28/2023   LPAV lesion is 75% stenosed.   LM lesion is 99% stenosed.   Ost LM lesion is 80% stenosed.   Prox LAD to Mid LAD lesion is 70% stenosed.   Ost Ramus to Ramus lesion is 80% stenosed.   Ost Cx to Prox Cx lesion is 85% stenosed.   Mid Cx lesion is 50% stenosed.   Prox RCA to Mid RCA lesion is 80% stenosed.   Mid RCA lesion is 85% stenosed.   Origin lesion is 100% stenosed.   Non-stenotic Prox Graft lesion was previously treated.   There is mild left ventricular systolic dysfunction.   LV end diastolic pressure is mildly elevated.   The left ventricular ejection fraction is 45-50% by visual estimate.   There is no mitral valve regurgitation.   In the absence of any other complications or medical issues, we expect the patient to be ready for discharge from a cath perspective.   Recommend dual antiplatelet therapy with Aspirin 81mg  daily. Outpatient diagnostic cardiac cath because of angina known coronary artery disease Right femoral artery approach Left ventriculogram showed inferior hypokinesis mild LVE EF around 45 to 50% Coronaries Left main separate ostia Short LAD 100% occluded at the ostium CTO Circumflex diffuse 90% ostium to proximal TIMI II flow questionable CTO proximally  left dominant system RCA diffuse disease nondominant Grafts LIMA to mid LAD widely patent SVG to OM1 patent stent proximal graft widely patent SVG to PDA 100% occluded CTO Films essentially unchanged from previous procedure Consider intervention of native circumflex as it supplies were used territory in the left dominant system currently essentially unprotected Will review films with other at tertiary care interventionalists consider high risk complex PCI of proximal to mid circumflex Recommend aggressive advancement escalation of GDMT increase Imdur consider adding Ranexa continue beta-blocker maximize statin management   DG  Chest 2 View Result Date: 12/26/2023 CLINICAL DATA:  Chest pain EXAM: CHEST - 2 VIEW COMPARISON:  None Available. FINDINGS: The heart size and mediastinal contours are within normal limits. Status post coronary artery bypass grafting. Both lungs are clear. The visualized skeletal structures are unremarkable. IMPRESSION: No active cardiopulmonary disease. Electronically Signed   By: Worthy Heads M.D.   On: 12/26/2023 00:35    Microbiology: Recent Results (from the past 240 hours)  Surgical PCR screen     Status: None   Collection Time: 12/28/23 12:50 AM   Specimen: Nasal Mucosa; Nasal Swab  Result Value Ref Range Status   MRSA, PCR NEGATIVE NEGATIVE Final   Staphylococcus aureus NEGATIVE NEGATIVE Final    Comment: (NOTE) The Xpert SA Assay (FDA approved for NASAL specimens in patients 25 years of age and older), is one component of a comprehensive surveillance program. It is not intended to diagnose infection nor to guide or monitor treatment. Performed at [facility], [address] [place], [place] [address]      Labs: CBC: Recent Labs  Lab 12/26/23 0016 12/27/23 0242 12/28/23 0417  WBC 8.6 8.1 7.6  HGB 12.2* 12.3* 12.3*  HCT 36.5* 36.6* 36.7*  MCV 83.1 82.4 82.1  PLT 283 268 255   Basic Metabolic Panel: Recent Labs  Lab 12/26/23 0016  12/27/23 0242 12/28/23 0417  NA 134* 137 135  K 3.7 4.4 3.9  CL 101 104 102  CO2 24 24 23   GLUCOSE 177* 157* 144*  BUN 19 17 19   CREATININE 1.10 0.90 0.92  CALCIUM 9.5 9.2 8.8*  MG  --  2.1 2.3  PHOS  --  3.7 3.8   Liver Function Tests: No results for input(s): "AST", "ALT", "ALKPHOS", "BILITOT", "PROT", "ALBUMIN" in the last 168 hours. No results for input(s): "LIPASE", "AMYLASE" in the last 168 hours. No results for input(s): "AMMONIA" in the last 168 hours. Cardiac Enzymes: No results for input(s): "CKTOTAL", "CKMB", "CKMBINDEX", "TROPONINI" in the last 168 hours. BNP (last 3 results) No results for input(s): "BNP" in the last 8760 hours. CBG: Recent Labs  Lab 12/27/23 0801 12/27/23 1108 12/27/23 1654 12/27/23 2132 12/28/23 0834  GLUCAP 160* 117* 138* 120* 149*    Time spent: 35 minutes  Signed:  Althia Atlas  [facility] 12/28/2023 3:37 PM

## 2023-12-28 NOTE — Progress Notes (Signed)
 Patient stable.  Bedside handoff given to [facility]

## 2023-12-29 ENCOUNTER — Encounter: Payer: Self-pay | Admitting: Internal Medicine

## 2024-02-01 ENCOUNTER — Other Ambulatory Visit: Payer: Self-pay
